# Patient Record
Sex: Female | Born: 1960
Health system: Southern US, Community
[De-identification: ages and names within clinical notes are randomized; demographics above are authoritative.]

## PROBLEM LIST (undated history)

## (undated) DIAGNOSIS — K219 Gastro-esophageal reflux disease without esophagitis: Secondary | ICD-10-CM

## (undated) DIAGNOSIS — M1990 Unspecified osteoarthritis, unspecified site: Secondary | ICD-10-CM

## (undated) DIAGNOSIS — M810 Age-related osteoporosis without current pathological fracture: Secondary | ICD-10-CM

## (undated) HISTORY — DX: Gastro-esophageal reflux disease without esophagitis: K21.9

## (undated) HISTORY — PX: AUGMENTATION MAMMAPLASTY: SUR837

## (undated) HISTORY — DX: Unspecified osteoarthritis, unspecified site: M19.90

---

## 1998-03-30 HISTORY — PX: BREAST ENHANCEMENT SURGERY: SHX7

## 2005-09-11 ENCOUNTER — Ambulatory Visit: Payer: Self-pay | Admitting: Unknown Physician Specialty

## 2008-04-09 LAB — HM COLONOSCOPY: HM Colonoscopy: NORMAL

## 2008-11-12 ENCOUNTER — Emergency Department: Payer: Self-pay | Admitting: Emergency Medicine

## 2009-04-24 ENCOUNTER — Ambulatory Visit: Payer: Self-pay | Admitting: Gastroenterology

## 2010-01-27 ENCOUNTER — Emergency Department: Payer: Self-pay | Admitting: Emergency Medicine

## 2010-10-23 ENCOUNTER — Emergency Department: Payer: Self-pay | Admitting: Emergency Medicine

## 2011-07-16 DIAGNOSIS — D7589 Other specified diseases of blood and blood-forming organs: Secondary | ICD-10-CM | POA: Insufficient documentation

## 2011-11-26 DIAGNOSIS — R609 Edema, unspecified: Secondary | ICD-10-CM | POA: Insufficient documentation

## 2012-02-01 ENCOUNTER — Ambulatory Visit: Payer: Self-pay | Admitting: Internal Medicine

## 2012-04-04 LAB — HM PAP SMEAR: HM Pap smear: NORMAL

## 2012-04-04 LAB — HM MAMMOGRAPHY: HM MAMMO: NORMAL

## 2014-11-09 ENCOUNTER — Ambulatory Visit (INDEPENDENT_AMBULATORY_CARE_PROVIDER_SITE_OTHER): Payer: BLUE CROSS/BLUE SHIELD | Admitting: Nurse Practitioner

## 2014-11-09 ENCOUNTER — Encounter: Payer: Self-pay | Admitting: Nurse Practitioner

## 2014-11-09 VITALS — BP 104/68 | HR 73 | Temp 97.9°F | Resp 14 | Ht 64.0 in | Wt 104.6 lb

## 2014-11-09 DIAGNOSIS — M1990 Unspecified osteoarthritis, unspecified site: Secondary | ICD-10-CM

## 2014-11-09 DIAGNOSIS — Z130 Encounter for screening for diseases of the blood and blood-forming organs and certain disorders involving the immune mechanism: Secondary | ICD-10-CM

## 2014-11-09 DIAGNOSIS — Z1329 Encounter for screening for other suspected endocrine disorder: Secondary | ICD-10-CM | POA: Diagnosis not present

## 2014-11-09 DIAGNOSIS — Z131 Encounter for screening for diabetes mellitus: Secondary | ICD-10-CM | POA: Diagnosis not present

## 2014-11-09 DIAGNOSIS — Z13220 Encounter for screening for lipoid disorders: Secondary | ICD-10-CM

## 2014-11-09 DIAGNOSIS — Z7689 Persons encountering health services in other specified circumstances: Secondary | ICD-10-CM

## 2014-11-09 DIAGNOSIS — K219 Gastro-esophageal reflux disease without esophagitis: Secondary | ICD-10-CM | POA: Diagnosis not present

## 2014-11-09 DIAGNOSIS — Z7189 Other specified counseling: Secondary | ICD-10-CM

## 2014-11-09 DIAGNOSIS — Z1239 Encounter for other screening for malignant neoplasm of breast: Secondary | ICD-10-CM

## 2014-11-09 LAB — LIPID PANEL
CHOLESTEROL: 212 mg/dL — AB (ref 125–200)
HDL: 88 mg/dL (ref >=46)
LDL Cholesterol: 111 mg/dL (ref <130)
TRIGLYCERIDES: 66 mg/dL (ref <150)
Total CHOL/HDL Ratio: 2.4 Ratio (ref <=5.0)
VLDL: 13 mg/dL (ref <30)

## 2014-11-09 LAB — CBC WITH DIFFERENTIAL/PLATELET
BASOS ABS: 0 10*3/uL (ref 0.0–0.1)
BASOS PCT: 1 % (ref 0–1)
EOS ABS: 0 10*3/uL (ref 0.0–0.7)
EOS PCT: 1 % (ref 0–5)
HCT: 38.9 % (ref 36.0–46.0)
HEMOGLOBIN: 13.2 g/dL (ref 12.0–15.0)
Lymphocytes Relative: 39 % (ref 12–46)
Lymphs Abs: 1.9 10*3/uL (ref 0.7–4.0)
MCH: 33.2 pg (ref 26.0–34.0)
MCHC: 33.9 g/dL (ref 30.0–36.0)
MCV: 97.7 fL (ref 78.0–100.0)
MONOS PCT: 8 % (ref 3–12)
MPV: 11.9 fL (ref 8.6–12.4)
Monocytes Absolute: 0.4 10*3/uL (ref 0.1–1.0)
NEUTROS ABS: 2.5 10*3/uL (ref 1.7–7.7)
Neutrophils Relative %: 51 % (ref 43–77)
PLATELETS: 125 10*3/uL — AB (ref 150–400)
RBC: 3.98 MIL/uL (ref 3.87–5.11)
RDW: 13.5 % (ref 11.5–15.5)
WBC: 4.9 10*3/uL (ref 4.0–10.5)

## 2014-11-09 LAB — COMPREHENSIVE METABOLIC PANEL
ALT: 17 U/L (ref 6–29)
AST: 21 U/L (ref 10–35)
Albumin: 4.2 g/dL (ref 3.6–5.1)
Alkaline Phosphatase: 66 U/L (ref 33–130)
BILIRUBIN TOTAL: 0.6 mg/dL (ref 0.2–1.2)
BUN: 12 mg/dL (ref 7–25)
CALCIUM: 9.4 mg/dL (ref 8.6–10.4)
CHLORIDE: 100 mmol/L (ref 98–110)
CO2: 26 mmol/L (ref 20–31)
CREATININE: 0.82 mg/dL (ref 0.50–1.05)
Glucose, Bld: 87 mg/dL (ref 65–99)
Potassium: 4.1 mmol/L (ref 3.5–5.3)
Sodium: 140 mmol/L (ref 135–146)
Total Protein: 6.5 g/dL (ref 6.1–8.1)

## 2014-11-09 LAB — TSH: TSH: 1.568 u[IU]/mL (ref 0.350–4.500)

## 2014-11-09 NOTE — Assessment & Plan Note (Signed)
Stable. Pt takes Ibuprofen OTC prn for hands aching. Will follow.

## 2014-11-09 NOTE — Assessment & Plan Note (Signed)
Worked up for LUQ pain 3 years ago with EGD and [place] and pt reports it was just GERD. Pt is having same symptoms and wanted to be checked and see if it is still GERD. Assured pt after exam that it most likely is. Zantac OTC 150 mg daily to twice daily before largest meal(s). Will follow.

## 2014-11-09 NOTE — Patient Instructions (Signed)

## 2014-11-09 NOTE — Assessment & Plan Note (Addendum)
Discussed acute and chronic issues. Reviewed health maintenance measures, PFSHx, and immunizations. Obtain routine labs TSH, Lipid panel, CBC w/ diff, A1c, and CMET. Pt was fasting at today's visit.   Needs updated PAP. Requested she schedule this and a physical if needed.   Mammogram referral placed

## 2014-11-09 NOTE — Progress Notes (Signed)
Pre visit review using our clinic review tool, if applicable. No additional management support is needed unless otherwise documented below in the visit note. 

## 2014-11-09 NOTE — Progress Notes (Signed)
Patient ID: Leslie Davidson, female    DOB: 10/01/1960  Age: 54 y.o. MRN: [id number]  CC: Establish Care   HPI Leslie Davidson presents for establishing care and CC of GERD.   1) New pt info:   Immunizations- tdap 2014  Mammogram- 2014, needs updated with implants   Pap- 2014 normal   Colonoscopy- 2010, every 10 years pt reports   Eye Exam- 7/16, no changes   Dental Exam- UTD  2) Chronic Problems-  Arthritis- Hands, flares occasionally, ibuprofen helpful GERD- Not taken anything recently, took gas-x    Tried Zantac in past with help 3) Acute Problems- Left side pain- Bothering her worse with driving x 4 weeks, denies taking anything recently for this, states this is the same feeling when she was worked up 3 years ago for left side pain--> GERD was end result.   History Leslie Davidson has a past medical history of Arthritis and GERD (gastroesophageal reflux disease).   She has past surgical history that includes Breast enhancement surgery (2000).   Her family history includes Arthritis in her maternal grandmother; Cancer in her father; Diabetes in her mother.She reports that she quit smoking about 31 years ago. Her smoking use included Cigarettes. She has never used smokeless tobacco. She reports that she drinks alcohol. She reports that she does not use illicit drugs.  No outpatient prescriptions prior to visit.   No facility-administered medications prior to visit.    ROS Review of Systems  Constitutional: Negative for fever, chills, diaphoresis and fatigue.  Respiratory: Negative for chest tightness, shortness of breath and wheezing.   Cardiovascular: Negative for chest pain, palpitations and leg swelling.  Gastrointestinal: Positive for abdominal pain. Negative for nausea, vomiting, diarrhea and abdominal distention.  Skin: Negative for rash.  Neurological: Negative for dizziness, weakness, numbness and headaches.  Psychiatric/Behavioral: The patient is not nervous/anxious.      Objective:  BP 104/68 mmHg  Pulse 73  Temp(Src) 97.9 F (36.6 C)  Resp 14  Ht 5\' 4"  (1.626 m)  Wt 104 lb 9.6 oz (47.446 kg)  BMI 17.95 kg/m2  SpO2 98%  LMP 01/02/2013  Physical Exam  Constitutional: She is oriented to person, place, and time. She appears well-developed and well-nourished. No distress.  HENT:  Head: Normocephalic and atraumatic.  Right Ear: External ear normal.  Left Ear: External ear normal.  Cardiovascular: Normal rate, regular rhythm and normal heart sounds.  Exam reveals no gallop and no friction rub.   No murmur heard. Pulmonary/Chest: Effort normal and breath sounds normal. No respiratory distress. She has no wheezes. She has no rales. She exhibits no tenderness.  Abdominal: Soft. Bowel sounds are normal. She exhibits no distension and no mass. There is no tenderness. There is no rebound, no guarding and no CVA tenderness.  Neurological: She is alert and oriented to person, place, and time. No cranial nerve deficit. She exhibits normal muscle tone. Coordination normal.  Skin: Skin is warm and dry. No rash noted. She is not diaphoretic.  Psychiatric: She has a normal mood and affect. Her behavior is normal. Judgment and thought content normal.    Assessment & Plan:   Vance was seen today for establish care.  Diagnoses and all orders for this visit:  Gastroesophageal reflux disease without esophagitis  Screening for thyroid disorder -     TSH  Screening for deficiency anemia -     CBC with Differential/Platelet  Screening for diabetes mellitus -     Comprehensive metabolic panel -  Hemoglobin A1c  Screening for hyperlipidemia -     Lipid panel  Encounter to establish care  Screening for breast cancer -     MM SCREENING BREAST W/IMPLANT TOMO BILATERAL; Future  Arthritis  Ms. Kuipers does not currently have medications on file.  No orders of the defined types were placed in this encounter.     Follow-up: Return in about 6 months  (around 05/12/2015) for GERD follow up.

## 2014-11-10 LAB — HEMOGLOBIN A1C
Hgb A1c MFr Bld: 5.8 % — ABNORMAL HIGH (ref <5.7)
MEAN PLASMA GLUCOSE: 120 mg/dL — AB (ref <117)

## 2014-11-20 ENCOUNTER — Other Ambulatory Visit: Payer: Self-pay | Admitting: Nurse Practitioner

## 2014-11-20 ENCOUNTER — Ambulatory Visit
Admission: RE | Admit: 2014-11-20 | Discharge: 2014-11-20 | Disposition: A | Discharge status: Home / Self Care | Payer: BLUE CROSS/BLUE SHIELD | Source: Ambulatory Visit | Attending: Nurse Practitioner | Admitting: Nurse Practitioner

## 2014-11-20 DIAGNOSIS — Z9882 Breast implant status: Secondary | ICD-10-CM | POA: Insufficient documentation

## 2014-11-20 DIAGNOSIS — Z1231 Encounter for screening mammogram for malignant neoplasm of breast: Secondary | ICD-10-CM | POA: Insufficient documentation

## 2014-11-20 DIAGNOSIS — Z1239 Encounter for other screening for malignant neoplasm of breast: Secondary | ICD-10-CM

## 2014-11-22 ENCOUNTER — Other Ambulatory Visit: Payer: Self-pay | Admitting: Nurse Practitioner

## 2014-11-22 DIAGNOSIS — Z1239 Encounter for other screening for malignant neoplasm of breast: Secondary | ICD-10-CM

## 2014-11-28 ENCOUNTER — Ambulatory Visit
Admission: RE | Admit: 2014-11-28 | Discharge: 2014-11-28 | Disposition: A | Discharge status: Home / Self Care | Payer: BLUE CROSS/BLUE SHIELD | Source: Ambulatory Visit | Attending: Nurse Practitioner | Admitting: Nurse Practitioner

## 2014-11-28 ENCOUNTER — Other Ambulatory Visit: Payer: Self-pay | Admitting: Nurse Practitioner

## 2014-11-28 DIAGNOSIS — R928 Other abnormal and inconclusive findings on diagnostic imaging of breast: Secondary | ICD-10-CM | POA: Diagnosis not present

## 2014-11-28 DIAGNOSIS — Z1239 Encounter for other screening for malignant neoplasm of breast: Secondary | ICD-10-CM

## 2015-02-09 ENCOUNTER — Emergency Department
Admission: EM | Admit: 2015-02-09 | Discharge: 2015-02-09 | Disposition: A | Discharge status: Home / Self Care | Payer: BLUE CROSS/BLUE SHIELD | Attending: Emergency Medicine | Admitting: Emergency Medicine

## 2015-02-09 ENCOUNTER — Emergency Department: Payer: BLUE CROSS/BLUE SHIELD

## 2015-02-09 DIAGNOSIS — R079 Chest pain, unspecified: Secondary | ICD-10-CM

## 2015-02-09 DIAGNOSIS — Z87891 Personal history of nicotine dependence: Secondary | ICD-10-CM | POA: Diagnosis not present

## 2015-02-09 DIAGNOSIS — R6884 Jaw pain: Secondary | ICD-10-CM | POA: Diagnosis present

## 2015-02-09 LAB — CBC WITH DIFFERENTIAL/PLATELET
Basophils Absolute: 0.1 10*3/uL (ref 0–0.1)
Basophils Relative: 1 %
Eosinophils Absolute: 0.1 10*3/uL (ref 0–0.7)
Eosinophils Relative: 2 %
HEMATOCRIT: 38.1 % (ref 35.0–47.0)
HEMOGLOBIN: 12.8 g/dL (ref 12.0–16.0)
LYMPHS PCT: 37 %
Lymphs Abs: 1.5 10*3/uL (ref 1.0–3.6)
MCH: 33 pg (ref 26.0–34.0)
MCHC: 33.5 g/dL (ref 32.0–36.0)
MCV: 98.5 fL (ref 80.0–100.0)
MONOS PCT: 9 %
Monocytes Absolute: 0.3 10*3/uL (ref 0.2–0.9)
NEUTROS ABS: 2.1 10*3/uL (ref 1.4–6.5)
NEUTROS PCT: 51 %
Platelets: 182 10*3/uL (ref 150–440)
RBC: 3.86 MIL/uL (ref 3.80–5.20)
RDW: 12.8 % (ref 11.5–14.5)
WBC: 4 10*3/uL (ref 3.6–11.0)

## 2015-02-09 LAB — BASIC METABOLIC PANEL
Anion gap: 6 (ref 5–15)
BUN: 18 mg/dL (ref 6–20)
CHLORIDE: 107 mmol/L (ref 101–111)
CO2: 26 mmol/L (ref 22–32)
CREATININE: 0.61 mg/dL (ref 0.44–1.00)
Calcium: 9.1 mg/dL (ref 8.9–10.3)
GFR calc non Af Amer: >60 mL/min (ref >60)
Glucose, Bld: 105 mg/dL — ABNORMAL HIGH (ref 65–99)
POTASSIUM: 3.8 mmol/L (ref 3.5–5.1)
Sodium: 139 mmol/L (ref 135–145)

## 2015-02-09 LAB — TROPONIN I

## 2015-02-09 MED ORDER — MORPHINE SULFATE (PF) 4 MG/ML IV SOLN
4.0000 mg | Freq: Once | INTRAVENOUS | Status: DC
  Filled 2015-02-09: qty 1

## 2015-02-09 MED ORDER — ASPIRIN 81 MG PO CHEW
324.0000 mg | CHEWABLE_TABLET | Freq: Once | ORAL | Status: AC
Start: 2015-02-09 — End: 2015-02-09
  Administered 2015-02-09: 324 mg via ORAL
  Filled 2015-02-09: qty 4

## 2015-02-09 MED ORDER — NITROGLYCERIN 0.4 MG SL SUBL
0.4000 mg | SUBLINGUAL_TABLET | SUBLINGUAL | Status: DC | PRN
  Administered 2015-02-09: 0.4 mg via SUBLINGUAL
  Filled 2015-02-09: qty 1

## 2015-02-09 MED ORDER — ONDANSETRON HCL 4 MG/2ML IJ SOLN
4.0000 mg | Freq: Once | INTRAMUSCULAR | Status: DC
  Filled 2015-02-09: qty 2

## 2015-02-09 NOTE — ED Notes (Addendum)
Pt presents to ED with c/o left jaw pain that woke her up from sleep.  She states the pain radiates to left arm.  She report reports that she had some nausea during the day yesterday.  Pt also report that she had right jaw pain several weeks ago and she was evaluated and treated by her dentist for TMJ.

## 2015-02-09 NOTE — ED Provider Notes (Addendum)
[facility] Emergency Department Provider Note  ____________________________________________  Time seen: Approximately 530 AM  I have reviewed the triage vital signs and the nursing notes.   HISTORY  Chief Complaint Jaw Pain    HPI Leslie Davidson is a 54 y.o. female with a history of arthritis and GERD who is presenting tonight with several weeks of left-sided jaw pain. She says that the jaw pain has been constant but today woke her up from sleep with pain radiating into her left arm. She says that the pain was especially bad at her wrist. She says that she is also had chest pain off and on for several weeks with the jaw pain. She denies any exertional worsening of the pain. Denies any radiation of the back. Says that she is experiencing some short of breath when she lies down at night with the pain. No nausea or vomiting or diaphoresis. She denies any history of cardiac disease in her family. The patient is not a smoker and denies any illicit drug use. She describes the jaw pain is to the angle of her left mandible that does not worsen with chewing. She says she does do some heavy lifting of her grandson but she does not have any worsening of her symptoms when she lets the grandson.   Past Medical History  Diagnosis Date  . Arthritis   . GERD (gastroesophageal reflux disease)     Patient Active Problem List   Diagnosis Date Noted  . GERD (gastroesophageal reflux disease) 11/09/2014  . Encounter to establish care 11/09/2014  . Screening for thyroid disorder 11/09/2014  . Screening for deficiency anemia 11/09/2014  . Screening for diabetes mellitus 11/09/2014  . Screening for hyperlipidemia 11/09/2014  . Screening for breast cancer 11/09/2014  . Arthritis 11/09/2014    Past Surgical History  Procedure Laterality Date  . Breast enhancement surgery  2000  . Augmentation mammaplasty Bilateral     1998    No current outpatient prescriptions on  file.  Allergies Review of patient's allergies indicates no known allergies.  Family History  Problem Relation Age of Onset  . Diabetes Mother   . Cancer Father     Lung cancer  . Arthritis Maternal Grandmother     Social History Social History  Substance Use Topics  . Smoking status: Former Smoker    Types: Cigarettes    Quit date: 03/31/1983  . Smokeless tobacco: Never Used  . Alcohol Use: 0.0 oz/week    0 Standard drinks or equivalent per week     Comment: Occasional     Review of Systems Constitutional: No fever/chills Eyes: No visual changes. ENT: No sore throat. Cardiovascular: As above  Respiratory: As above  Gastrointestinal: No abdominal pain.  No nausea, no vomiting.  No diarrhea.  No constipation. Genitourinary: Negative for dysuria. Musculoskeletal: Negative for back pain. Skin: Negative for rash. Neurological: Negative for headaches, focal weakness or numbness.  10-point ROS otherwise negative.  ____________________________________________   PHYSICAL EXAM:  VITAL SIGNS: ED Triage Vitals  Enc Vitals Group     BP 02/09/15 0445 110/67 mmHg     Pulse Rate 02/09/15 0445 62     Resp 02/09/15 0445 22     Temp 02/09/15 0445 100 F (37.8 C)     Temp Source 02/09/15 0445 Oral     SpO2 02/09/15 0445 100 %     Weight 02/09/15 0445 103 lb (46.72 kg)     Height 02/09/15 0445 5\' 4"  (1.626 m)  Head Cir --      Peak Flow --      Pain Score 02/09/15 0446 4     Pain Loc --      Pain Edu? --      Excl. in [place]? --     Constitutional: Alert and oriented. Well appearing and in no acute distress. Eyes: Conjunctivae are normal. PERRL. EOMI. Head: Atraumatic. Nose: No congestion/rhinnorhea. Mouth/Throat: Mucous membranes are moist.  Oropharynx non-erythematous. Neck: No stridor.  No tenderness palpation to the trapezius muscles. Cardiovascular: Normal rate, regular rhythm. Grossly normal heart sounds.  Good peripheral circulation with bilateral and equal  radial pulses. Chest pain is not reproducible. Respiratory: Normal respiratory effort.  No retractions. Lungs CTAB. Gastrointestinal: Soft and nontender. No distention. No abdominal bruits. No CVA tenderness. Musculoskeletal: No lower extremity tenderness nor edema.  No joint effusions. Neurologic:  Normal speech and language. No gross focal neurologic deficits are appreciated. No gait instability. Intact sensation to light touch throughout the bilateral upper extremities. Skin:  Skin is warm, dry and intact. No rash noted. Psychiatric: Mood and affect are normal. Speech and behavior are normal.  ____________________________________________   LABS (all labs ordered are listed, but only abnormal results are displayed)  Labs Reviewed  BASIC METABOLIC PANEL - Abnormal; Notable for the following:    Glucose, Bld 105 (*)    All other components within normal limits  CBC WITH DIFFERENTIAL/PLATELET  TROPONIN I  TROPONIN I   ____________________________________________  EKG  ED ECG REPORT I, Doran Stabler, the attending physician, personally viewed and interpreted this ECG.   Date: 02/09/2015  EKG Time: 446  Rate: 63  Rhythm: normal sinus rhythm  Axis: Normal axis  Intervals:none  ST&T Change: No ST elevations or depressions. T-wave inversions in aVL as well as V2. No new inversions when compared with EKG from 01/27/2010.   ____________________________________________  RADIOLOGY  No evidence of acute cardiopulmonary disease on the chest x-ray. ____________________________________________   PROCEDURES   ____________________________________________   INITIAL IMPRESSION / ASSESSMENT AND PLAN / ED COURSE  Pertinent labs & imaging results that were available during my care of the patient were reviewed by me and considered in my medical decision making (see chart for details).  ----------------------------------------- 7:28 AM on  02/09/2015 -----------------------------------------  Patient is still reporting pain after nitroglycerin with no improvement at this time. Initial lab work is reassuring and does not show any elevated troponin after several weeks of jaw symptoms. The pain is nonexertional and atypical in many ways for cardiac chest pain. I discussed the case with Dr.Hochrein of the cardiology service on call who agrees to help set the patient up with outpatient follow-up. I explained this to the patient as well as her husband who are aware of the plan and willing to comply. Possibly jaw and left arm pain secondary to radiculopathy. The patient does say that she has been lifting her grandson frequently. However, given age and presentation I think it is prudent that the patient follow-up with cardiology. Still pending second troponin at this time. Signed out to Dr. Thomasene Lot. ____________________________________________   FINAL CLINICAL IMPRESSION(S) / ED DIAGNOSES  Acute chest pain.    Orbie Pyo, MD 02/09/15 0730  Less likely PE or dissection. Pain not radiating through to the back. Pulses are equal to the bilateral radial as well as dorsalis pedis. No shortness of breath at this time. No tachycardia or hypoxia.    Orbie Pyo, MD 02/09/15 [phone]

## 2015-02-09 NOTE — ED Provider Notes (Signed)
-----------------------------------------   8:12 AM on 02/09/2015 -----------------------------------------  Accepted signout from Dr. Dineen Kid. 73 her old female with jaw pain. Dr. Dineen Kid had low suspicion for cardiac, but felt it was appropriate to obtain a second troponin level and, if negative, discharge the patient with cardiology follow-up for further evaluation. Please see his history and physical for additional details on the patient's presentation and his clinical reasoning.  At this time, the troponin level has returned undetectable a second time.  Reevaluation of the patient finds her overall comfortable. We reviewed her history and how the discomfort started at approximately 3 AM. She is feeling better at this time. She looks well. She understands the plan to follow up with Dr. Rockey Situ at [facility].   Ahmed Prima, MD 02/09/15 [phone]

## 2015-02-09 NOTE — ED Notes (Signed)
NAD noted at this time. Pt ambulatory to the lobby, refused wheelchair. Pt denies comments/concerns at this time.

## 2015-02-12 ENCOUNTER — Ambulatory Visit: Payer: BLUE CROSS/BLUE SHIELD | Admitting: Nurse Practitioner

## 2015-02-20 ENCOUNTER — Ambulatory Visit: Payer: BLUE CROSS/BLUE SHIELD | Admitting: Cardiovascular Disease

## 2015-02-25 ENCOUNTER — Telehealth: Payer: Self-pay | Admitting: Internal Medicine

## 2015-02-25 NOTE — Telephone Encounter (Signed)
Pt called states that she needs a 3 month u/s. Looking through her chart she needs a 3 month left breast u/s. Please enter order.

## 2015-02-26 NOTE — Telephone Encounter (Signed)
Please advise 

## 2015-02-27 NOTE — Telephone Encounter (Signed)
Looks like this is your pt.  Let me know if you need to do something for you in getting this scheduled.

## 2015-03-01 ENCOUNTER — Other Ambulatory Visit: Payer: Self-pay | Admitting: Nurse Practitioner

## 2015-03-01 DIAGNOSIS — N6320 Unspecified lump in the left breast, unspecified quadrant: Secondary | ICD-10-CM

## 2015-03-01 NOTE — Telephone Encounter (Signed)
It looks like you have agreed to seeing this patient now. She has not been seen by you yet, therefore can not placed order under your name. Please advise & send to [facility].

## 2015-03-01 NOTE — Telephone Encounter (Signed)
Please let pt know that it has been ordered.   Thanks!

## 2015-03-01 NOTE — Telephone Encounter (Signed)
Pt called back to check on the status of her mammogram.. Please advise pt

## 2015-03-07 ENCOUNTER — Ambulatory Visit: Payer: BLUE CROSS/BLUE SHIELD

## 2015-03-14 ENCOUNTER — Telehealth: Payer: Self-pay | Admitting: *Deleted

## 2015-03-14 ENCOUNTER — Ambulatory Visit
Admission: RE | Admit: 2015-03-14 | Discharge: 2015-03-14 | Disposition: A | Discharge status: Home / Self Care | Payer: BLUE CROSS/BLUE SHIELD | Source: Ambulatory Visit | Attending: Nurse Practitioner | Admitting: Nurse Practitioner

## 2015-03-14 ENCOUNTER — Other Ambulatory Visit: Payer: Self-pay | Admitting: Nurse Practitioner

## 2015-03-14 DIAGNOSIS — N63 Unspecified lump in breast: Secondary | ICD-10-CM | POA: Diagnosis not present

## 2015-03-14 DIAGNOSIS — N6320 Unspecified lump in the left breast, unspecified quadrant: Secondary | ICD-10-CM

## 2015-03-14 NOTE — Telephone Encounter (Signed)
Please advise 

## 2015-03-14 NOTE — Telephone Encounter (Signed)
[facility] would like a order for a diagnostic mammogram -uni lateral for the left breast.

## 2015-03-15 NOTE — Telephone Encounter (Signed)
They called and I gave a verbal.

## 2015-03-17 ENCOUNTER — Other Ambulatory Visit: Payer: Self-pay | Admitting: Internal Medicine

## 2015-03-17 DIAGNOSIS — R928 Other abnormal and inconclusive findings on diagnostic imaging of breast: Secondary | ICD-10-CM

## 2015-03-17 NOTE — Progress Notes (Signed)
Order placed for f/u mammogram and ultrasound.  

## 2015-03-27 ENCOUNTER — Encounter: Payer: Self-pay | Admitting: Internal Medicine

## 2015-04-22 ENCOUNTER — Encounter (INDEPENDENT_AMBULATORY_CARE_PROVIDER_SITE_OTHER): Payer: Self-pay

## 2015-04-22 ENCOUNTER — Ambulatory Visit (INDEPENDENT_AMBULATORY_CARE_PROVIDER_SITE_OTHER): Payer: BLUE CROSS/BLUE SHIELD | Admitting: Cardiovascular Disease

## 2015-04-22 ENCOUNTER — Encounter: Payer: Self-pay | Admitting: Cardiovascular Disease

## 2015-04-22 VITALS — BP 100/60 | HR 68 | Ht 63.0 in | Wt 101.8 lb

## 2015-04-22 DIAGNOSIS — M79602 Pain in left arm: Secondary | ICD-10-CM | POA: Diagnosis not present

## 2015-04-22 DIAGNOSIS — Z0000 Encounter for general adult medical examination without abnormal findings: Secondary | ICD-10-CM | POA: Diagnosis not present

## 2015-04-22 DIAGNOSIS — R6884 Jaw pain: Secondary | ICD-10-CM

## 2015-04-22 DIAGNOSIS — E785 Hyperlipidemia, unspecified: Secondary | ICD-10-CM

## 2015-04-22 DIAGNOSIS — R7309 Other abnormal glucose: Secondary | ICD-10-CM | POA: Insufficient documentation

## 2015-04-22 DIAGNOSIS — R0602 Shortness of breath: Secondary | ICD-10-CM | POA: Diagnosis not present

## 2015-04-22 DIAGNOSIS — M79603 Pain in arm, unspecified: Secondary | ICD-10-CM | POA: Insufficient documentation

## 2015-04-22 NOTE — Assessment & Plan Note (Signed)
Atypical symptoms jaw pain, arm pain Rule out in the ER reviewed with her She has been exercising 4 miles per day since that time with no symptoms Borderline risk factors with borderline elevated glucose, average cholesterol level, no significant smoking history. As she is asymptomatic, no stress test ordered at this time

## 2015-04-22 NOTE — Assessment & Plan Note (Signed)
Cholesterol discussed with her in detail. Would not necessarily start a cholesterol medication unless a CT coronary calcium score documented calcified atherosclerotic coronary plaque

## 2015-04-22 NOTE — Patient Instructions (Signed)
You are doing well. No medication changes were made.  Please research CT coronary calcium score on google  Test is $150 total  Please call us if you have new issues that need to be addressed before your next appt.

## 2015-04-22 NOTE — Assessment & Plan Note (Signed)
She is already eating very healthy, very thin, exercises daily. Hemoglobin A1c borderline elevated, likely genetic issue. Encouraged her to continue her very healthy habits

## 2015-04-22 NOTE — Assessment & Plan Note (Signed)
Risk factors discussed with her with borderline glucose, cholesterol, No clear indication for stress testing as she is asymptomatic. Could consider CT coronary calcium scoring in [place]. This test was discussed with her in detail. She will research this and call us if she would like to have this ordered.

## 2015-04-22 NOTE — Progress Notes (Signed)
Patient ID: Leslie Davidson, female    DOB: 06-14-1960, 55 y.o.   MRN: [id number]  HPI Comments: [name] is a very pleasant 55 year old woman, patient of Dr. Nicki Reaper, recently seen in the emergency room for her drop pain, left chest/left shoulder, arm pain who presents by referral from the emergency room doctor, Dr. Francene Castle, for evaluation of arm pain, jaw pain.   She reports that in November 2016 she was woken from sleep with left jaw, left arm pain. She had been having difficulty with right jaw, TMJ. Emergency room notes indicate left-sided chest pain on and off, left side jaw pain on and off Workup in the emergency room reviewed with her including lab work, troponin 2, CBC, BMP, chest x-ray. These were reviewed independently by myself  Since she was seen in the emergency room, she denies having any further symptoms She is active, treadmills 4 miles per day with no symptoms of jaw pain, arm pain, chest pain  Lab work reviewed with her in detail showing total cholesterol 212, hemoglobin A1c 5.8 Mother has diabetes, high cholesterol Father died of lung cancer  Patient does have a smoking history, quit before age 5 She is very careful with her diet, eats very healthy on a regular basis  EKG on today's visit shows normal sinus rhythm with rate 68 bpm, no significant ST or T-wave changes  Currently not on any medications apart from Zantac as needed      No Known Allergies  No current outpatient prescriptions on file prior to visit.   No current facility-administered medications on file prior to visit.    Past Medical History  Diagnosis Date  . Arthritis   . GERD (gastroesophageal reflux disease)     Past Surgical History  Procedure Laterality Date  . Breast enhancement surgery  2000  . Augmentation mammaplasty Bilateral     1998    Social History  reports that she quit smoking about 32 years ago. Her smoking use included Cigarettes. She has never used smokeless  tobacco. She reports that she drinks alcohol. She reports that she does not use illicit drugs.  Family History family history includes Arthritis in her maternal grandmother; Cancer in her father; Diabetes in her mother.    Review of Systems  Constitutional: Negative.   Respiratory: Negative.   Cardiovascular: Positive for chest pain.       Left arm pain, jaw pain in November  Gastrointestinal: Negative.   Musculoskeletal: Negative.   Neurological: Negative.   Hematological: Negative.   Psychiatric/Behavioral: Negative.   All other systems reviewed and are negative.   BP 100/60 mmHg  Pulse 68  Ht 5\' 3"  (1.6 m)  Wt 101 lb 12 oz (46.153 kg)  BMI 18.03 kg/m2  LMP 01/02/2013  Physical Exam  Constitutional: She is oriented to person, place, and time. She appears well-developed and well-nourished.  HENT:  Head: Normocephalic.  Nose: Nose normal.  Mouth/Throat: Oropharynx is clear and moist.  Eyes: Conjunctivae are normal. Pupils are equal, round, and reactive to light.  Neck: Normal range of motion. Neck supple. No JVD present.  Cardiovascular: Normal rate, regular rhythm, normal heart sounds and intact distal pulses.  Exam reveals no gallop and no friction rub.   No murmur heard. Pulmonary/Chest: Effort normal and breath sounds normal. No respiratory distress. She has no wheezes. She has no rales. She exhibits no tenderness.  Abdominal: Soft. Bowel sounds are normal. She exhibits no distension. There is no tenderness.  Musculoskeletal: Normal  range of motion. She exhibits no edema or tenderness.  Lymphadenopathy:    She has no cervical adenopathy.  Neurological: She is alert and oriented to person, place, and time. Coordination normal.  Skin: Skin is warm and dry. No rash noted. No erythema.  Psychiatric: She has a normal mood and affect. Her behavior is normal. Judgment and thought content normal.

## 2015-05-13 ENCOUNTER — Ambulatory Visit: Payer: BLUE CROSS/BLUE SHIELD | Admitting: Nurse Practitioner

## 2015-05-13 ENCOUNTER — Encounter: Payer: Self-pay | Admitting: Internal Medicine

## 2015-05-13 ENCOUNTER — Ambulatory Visit (INDEPENDENT_AMBULATORY_CARE_PROVIDER_SITE_OTHER): Payer: BLUE CROSS/BLUE SHIELD | Admitting: Internal Medicine

## 2015-05-13 VITALS — BP 100/70 | HR 64 | Temp 98.5°F | Resp 18 | Ht 63.0 in | Wt 103.5 lb

## 2015-05-13 DIAGNOSIS — R928 Other abnormal and inconclusive findings on diagnostic imaging of breast: Secondary | ICD-10-CM | POA: Diagnosis not present

## 2015-05-13 DIAGNOSIS — R7309 Other abnormal glucose: Secondary | ICD-10-CM | POA: Diagnosis not present

## 2015-05-13 DIAGNOSIS — K219 Gastro-esophageal reflux disease without esophagitis: Secondary | ICD-10-CM | POA: Diagnosis not present

## 2015-05-13 NOTE — Progress Notes (Signed)
Patient ID: AVRIEL WUJCIK, female   DOB: [birth year], 55 y.o.   MRN: [id number]   Subjective:    Patient ID: Leslie Davidson, female    DOB: 1961-01-13, 55 y.o.   MRN: [id number]  HPI  Patient here to establish care.  She tries to stay active.  Exercises.  No cardiac symptoms with increased activity or exertion.  Has a documented history of GERD.  Takes zantac prn.  Does not appear to have a significant problem with acid reflux.  No abdominal pain or cramping.  Some constipation.  Takes stool softener.  Had colonoscopy 03/2008.  States recommended f/u in 10 years.  Quit smoking in 1985.     Past Medical History  Diagnosis Date  . Arthritis   . GERD (gastroesophageal reflux disease)    Past Surgical History  Procedure Laterality Date  . Breast enhancement surgery  2000  . Augmentation mammaplasty Bilateral     1998   Family History  Problem Relation Age of Onset  . Diabetes Mother   . Cancer Father     Lung cancer  . Arthritis Maternal Grandmother    Social History   Social History  . Marital Status: Married    Spouse Name: N/A  . Number of Children: N/A  . Years of Education: N/A   Social History Main Topics  . Smoking status: Former Smoker    Types: Cigarettes    Quit date: 03/31/1983  . Smokeless tobacco: Never Used  . Alcohol Use: 0.0 oz/week    0 Standard drinks or equivalent per week     Comment: Occasional   . Drug Use: No  . Sexual Activity:    Partners: Male    Birth Control/ Protection: Post-menopausal     Comment: Husband   Other Topics Concern  . None   Social History Narrative   Retired from the school system    Lives with husband    Children- 3- son and 2 daughters    Pets: 1 dog lives inside    Caffeine- Coffee 3-4 cups, 1 cup diet pepsi       Outpatient Encounter Prescriptions as of 05/13/2015  Medication Sig  . ranitidine (ZANTAC) 150 MG tablet Take 150 mg by mouth as needed.    No facility-administered encounter medications on file as of  05/13/2015.    Review of Systems  Constitutional: Negative for appetite change and unexpected weight change.  HENT: Negative for congestion and sinus pressure.   Respiratory: Negative for cough, chest tightness and shortness of breath.   Cardiovascular: Negative for chest pain, palpitations and leg swelling.  Gastrointestinal: Positive for constipation. Negative for nausea, vomiting, abdominal pain and diarrhea.  Genitourinary: Negative for dysuria and difficulty urinating.  Musculoskeletal: Negative for back pain and joint swelling.  Skin: Negative for color change and rash.  Neurological: Negative for dizziness, light-headedness and headaches.  Psychiatric/Behavioral: Negative for dysphoric mood and agitation.       Objective:    Physical Exam  Constitutional: She appears well-developed and well-nourished. No distress.  HENT:  Nose: Nose normal.  Mouth/Throat: Oropharynx is clear and moist.  Eyes: Conjunctivae are normal. Right eye exhibits no discharge. Left eye exhibits no discharge.  Neck: Neck supple. No thyromegaly present.  Cardiovascular: Normal rate and regular rhythm.   Pulmonary/Chest: Breath sounds normal. No respiratory distress. She has no wheezes.  Abdominal: Soft. Bowel sounds are normal. There is no tenderness.  Musculoskeletal: She exhibits no edema or tenderness.  Lymphadenopathy:  She has no cervical adenopathy.  Skin: No rash noted. No erythema.  Psychiatric: She has a normal mood and affect. Her behavior is normal.    BP 100/70 mmHg  Pulse 64  Temp(Src) 98.5 F (36.9 C) (Oral)  Resp 18  Ht 5\' 3"  (1.6 m)  Wt 103 lb 8 oz (46.947 kg)  BMI 18.34 kg/m2  SpO2 97%  LMP 01/02/2013 Wt Readings from Last 3 Encounters:  05/13/15 103 lb 8 oz (46.947 kg)  04/22/15 101 lb 12 oz (46.153 kg)  02/09/15 103 lb (46.72 kg)     Lab Results  Component Value Date   WBC 4.0 02/09/2015   HGB 12.8 02/09/2015   HCT 38.1 02/09/2015   PLT 182 02/09/2015   GLUCOSE  105* 02/09/2015   CHOL 212* 11/09/2014   TRIG 66 11/09/2014   HDL 88 11/09/2014   LDLCALC 111 11/09/2014   ALT 17 11/09/2014   AST 21 11/09/2014   NA 139 02/09/2015   K 3.8 02/09/2015   CL 107 02/09/2015   CREATININE 0.61 02/09/2015   BUN 18 02/09/2015   CO2 26 02/09/2015   TSH 1.568 11/09/2014   HGBA1C 5.8* 11/09/2014    US Breast Ltd Uni Left Inc Axilla  03/14/2015  CLINICAL DATA:  Short time follow-up for probably benign left breast mass. EXAM: DIGITAL DIAGNOSTIC LEFT MAMMOGRAM WITH 3D TOMOSYNTHESIS WITH CAD ULTRASOUND LEFT BREAST COMPARISON:  Previous exam(s). ACR Breast Density Category b: There are scattered areas of fibroglandular density. FINDINGS: Note that initial ultrasound was performed, however both standard and implant displaced mammograms were subsequently performed of the left breast. A subpectoral saline left breast implant is present. No suspicious masses or calcifications are seen in the left breast. The low density circumscribed 7 mm probably benign mass in the slightly inner upper left breast appears unchanged when compared to the prior exam. A similar appearing mass is seen on the MLO view of the left breast on mammograms from 2013. Mammographic images were processed with CAD. Physical examination of the upper inner left breast does not reveal any palpable masses. Targeted ultrasound of the left breast was performed demonstrating an oval hypoechoic mass with an internal echogenic component at 11 o'clock 5 cm from the nipple measuring 0.3 x 0.1 x 0.3 cm, overall similar in appearance when compared to the prior exam in felt to represent an intramammary lymph node. IMPRESSION: Unchanged probably benign left breast mass. There is no mammographic evidence of malignancy in the left breast. RECOMMENDATION: Bilateral diagnostic mammography with possible left breast ultrasound August 2017 to demonstrate 1 year stability of the probably benign left breast mass. I have discussed the  findings and recommendations with the patient. Results were also provided in writing at the conclusion of the visit. If applicable, a reminder letter will be sent to the patient regarding the next appointment. BI-RADS CATEGORY  3: Probably benign. Electronically Signed   By: Everlean Alstrom M.D.   On: 03/14/2015 15:58   Mm Diag Breast Tomo Uni Left  03/14/2015  CLINICAL DATA:  Short time follow-up for probably benign left breast mass. EXAM: DIGITAL DIAGNOSTIC LEFT MAMMOGRAM WITH 3D TOMOSYNTHESIS WITH CAD ULTRASOUND LEFT BREAST COMPARISON:  Previous exam(s). ACR Breast Density Category b: There are scattered areas of fibroglandular density. FINDINGS: Note that initial ultrasound was performed, however both standard and implant displaced mammograms were subsequently performed of the left breast. A subpectoral saline left breast implant is present. No suspicious masses or calcifications are seen in the left breast.  The low density circumscribed 7 mm probably benign mass in the slightly inner upper left breast appears unchanged when compared to the prior exam. A similar appearing mass is seen on the MLO view of the left breast on mammograms from 2013. Mammographic images were processed with CAD. Physical examination of the upper inner left breast does not reveal any palpable masses. Targeted ultrasound of the left breast was performed demonstrating an oval hypoechoic mass with an internal echogenic component at 11 o'clock 5 cm from the nipple measuring 0.3 x 0.1 x 0.3 cm, overall similar in appearance when compared to the prior exam in felt to represent an intramammary lymph node. IMPRESSION: Unchanged probably benign left breast mass. There is no mammographic evidence of malignancy in the left breast. RECOMMENDATION: Bilateral diagnostic mammography with possible left breast ultrasound August 2017 to demonstrate 1 year stability of the probably benign left breast mass. I have discussed the findings and  recommendations with the patient. Results were also provided in writing at the conclusion of the visit. If applicable, a reminder letter will be sent to the patient regarding the next appointment. BI-RADS CATEGORY  3: Probably benign. Electronically Signed   By: Everlean Alstrom M.D.   On: 03/14/2015 15:58       Assessment & Plan:   Problem List Items Addressed This Visit    Abnormal mammogram    Previous documented abnormal mammogram.  See mammogram report.  Recommended f/u bilateral diagnostic mammogram with ultrasound in 10/2015.  Already scheduled.        Elevated glucose level    Schedule fasting glucose and a1c.       Relevant Orders   Lipid panel   Hemoglobin 123456   Basic metabolic panel   GERD (gastroesophageal reflux disease) - Primary    No significant problem with reflux currently.  Zantac as directed.  Has only been taking prn.  Follow.         I spent 25 minutes with the patient and more than 50% of the time was spent in consultation regarding the above.     Einar Pheasant, MD

## 2015-05-13 NOTE — Progress Notes (Signed)
Pre-visit discussion using our clinic review tool. No additional management support is needed unless otherwise documented below in the visit note.  

## 2015-05-27 ENCOUNTER — Encounter: Payer: Self-pay | Admitting: Internal Medicine

## 2015-05-27 DIAGNOSIS — R928 Other abnormal and inconclusive findings on diagnostic imaging of breast: Secondary | ICD-10-CM | POA: Insufficient documentation

## 2015-05-27 NOTE — Assessment & Plan Note (Signed)
Schedule fasting glucose and a1c.

## 2015-05-27 NOTE — Assessment & Plan Note (Signed)
Previous documented abnormal mammogram.  See mammogram report.  Recommended f/u bilateral diagnostic mammogram with ultrasound in 10/2015.  Already scheduled.

## 2015-05-27 NOTE — Assessment & Plan Note (Signed)
No significant problem with reflux currently.  Zantac as directed.  Has only been taking prn.  Follow.

## 2015-06-26 ENCOUNTER — Other Ambulatory Visit (INDEPENDENT_AMBULATORY_CARE_PROVIDER_SITE_OTHER): Payer: BLUE CROSS/BLUE SHIELD

## 2015-06-26 DIAGNOSIS — R7309 Other abnormal glucose: Secondary | ICD-10-CM

## 2015-06-26 LAB — BASIC METABOLIC PANEL
BUN: 16 mg/dL (ref 6–23)
CHLORIDE: 103 meq/L (ref 96–112)
CO2: 30 meq/L (ref 19–32)
CREATININE: 0.93 mg/dL (ref 0.40–1.20)
Calcium: 9.8 mg/dL (ref 8.4–10.5)
GFR: 66.13 mL/min (ref >60.00)
Glucose, Bld: 99 mg/dL (ref 70–99)
Potassium: 4.1 mEq/L (ref 3.5–5.1)
Sodium: 140 mEq/L (ref 135–145)

## 2015-06-26 LAB — LIPID PANEL
Cholesterol: 210 mg/dL — ABNORMAL HIGH (ref 0–200)
HDL: 86.1 mg/dL (ref >39.00)
LDL Cholesterol: 108 mg/dL — ABNORMAL HIGH (ref 0–99)
NONHDL: 124.05
Total CHOL/HDL Ratio: 2
Triglycerides: 82 mg/dL (ref 0.0–149.0)
VLDL: 16.4 mg/dL (ref 0.0–40.0)

## 2015-06-26 LAB — HEMOGLOBIN A1C: Hgb A1c MFr Bld: 5.7 % (ref 4.6–6.5)

## 2015-11-14 ENCOUNTER — Encounter: Payer: Self-pay | Admitting: Internal Medicine

## 2015-11-14 ENCOUNTER — Ambulatory Visit: Payer: BLUE CROSS/BLUE SHIELD | Admitting: Internal Medicine

## 2015-11-14 ENCOUNTER — Ambulatory Visit (INDEPENDENT_AMBULATORY_CARE_PROVIDER_SITE_OTHER): Payer: BLUE CROSS/BLUE SHIELD | Admitting: Internal Medicine

## 2015-11-14 VITALS — BP 108/60 | HR 62 | Wt 103.0 lb

## 2015-11-14 DIAGNOSIS — E785 Hyperlipidemia, unspecified: Secondary | ICD-10-CM

## 2015-11-14 DIAGNOSIS — R1013 Epigastric pain: Secondary | ICD-10-CM | POA: Diagnosis not present

## 2015-11-14 DIAGNOSIS — R928 Other abnormal and inconclusive findings on diagnostic imaging of breast: Secondary | ICD-10-CM | POA: Diagnosis not present

## 2015-11-14 DIAGNOSIS — R7309 Other abnormal glucose: Secondary | ICD-10-CM | POA: Diagnosis not present

## 2015-11-14 DIAGNOSIS — K219 Gastro-esophageal reflux disease without esophagitis: Secondary | ICD-10-CM | POA: Diagnosis not present

## 2015-11-14 LAB — BASIC METABOLIC PANEL
BUN: 13 mg/dL (ref 6–23)
CHLORIDE: 105 meq/L (ref 96–112)
CO2: 30 mEq/L (ref 19–32)
Calcium: 9.7 mg/dL (ref 8.4–10.5)
Creatinine, Ser: 0.84 mg/dL (ref 0.40–1.20)
GFR: 74.27 mL/min (ref >60.00)
Glucose, Bld: 88 mg/dL (ref 70–99)
POTASSIUM: 4.8 meq/L (ref 3.5–5.1)
Sodium: 139 mEq/L (ref 135–145)

## 2015-11-14 LAB — HEPATIC FUNCTION PANEL
ALBUMIN: 4.4 g/dL (ref 3.5–5.2)
ALT: 18 U/L (ref 0–35)
AST: 21 U/L (ref 0–37)
Alkaline Phosphatase: 63 U/L (ref 39–117)
Bilirubin, Direct: 0.1 mg/dL (ref 0.0–0.3)
Total Bilirubin: 0.4 mg/dL (ref 0.2–1.2)
Total Protein: 7 g/dL (ref 6.0–8.3)

## 2015-11-14 LAB — LIPID PANEL
CHOLESTEROL: 226 mg/dL — AB (ref 0–200)
HDL: 98.3 mg/dL (ref >39.00)
LDL CALC: 115 mg/dL — AB (ref 0–99)
NonHDL: 127.25
TRIGLYCERIDES: 62 mg/dL (ref 0.0–149.0)
Total CHOL/HDL Ratio: 2
VLDL: 12.4 mg/dL (ref 0.0–40.0)

## 2015-11-14 LAB — CBC WITH DIFFERENTIAL/PLATELET
BASOS ABS: 0 10*3/uL (ref 0.0–0.1)
Basophils Relative: 1 % (ref 0.0–3.0)
EOS PCT: 1.3 % (ref 0.0–5.0)
Eosinophils Absolute: 0.1 10*3/uL (ref 0.0–0.7)
HCT: 40.3 % (ref 36.0–46.0)
Hemoglobin: 13.5 g/dL (ref 12.0–15.0)
LYMPHS ABS: 1.7 10*3/uL (ref 0.7–4.0)
Lymphocytes Relative: 38.2 % (ref 12.0–46.0)
MCHC: 33.4 g/dL (ref 30.0–36.0)
MCV: 99.3 fl (ref 78.0–100.0)
MONOS PCT: 7.1 % (ref 3.0–12.0)
Monocytes Absolute: 0.3 10*3/uL (ref 0.1–1.0)
NEUTROS ABS: 2.3 10*3/uL (ref 1.4–7.7)
NEUTROS PCT: 52.4 % (ref 43.0–77.0)
PLATELETS: 193 10*3/uL (ref 150.0–400.0)
RBC: 4.06 Mil/uL (ref 3.87–5.11)
RDW: 13.8 % (ref 11.5–15.5)
WBC: 4.4 10*3/uL (ref 4.0–10.5)

## 2015-11-14 LAB — LIPASE: Lipase: 21 U/L (ref 11.0–59.0)

## 2015-11-14 LAB — HEMOGLOBIN A1C: HEMOGLOBIN A1C: 5.7 % (ref 4.6–6.5)

## 2015-11-14 MED ORDER — ESOMEPRAZOLE MAGNESIUM 40 MG PO CPDR: 40.0000 mg | DELAYED_RELEASE_CAPSULE | Freq: Every day | ORAL | 2 refills | Status: DC

## 2015-11-14 NOTE — Assessment & Plan Note (Signed)
Recheck met b and a1c.

## 2015-11-14 NOTE — Progress Notes (Signed)
Patient ID: Leslie Davidson, female   DOB: 09/02/1960, 55 y.o.   MRN: [id number]   Subjective:    Patient ID: Leslie Davidson, female    DOB: 04-Jun-1960, 55 y.o.   MRN: [id number]  HPI  Patient here for a scheduled follow up.  She stays active. No cardiac symptoms with increased activity or exertion. No sob.  Reports increased acid reflux.  comes up in her mouth. Burning.  Some epigastric discomfort at times.  Has been worked up previously.  Had EGD previously.  Had abdominal ultrasound.  No bowels change.  Increased stress.  Overall she feels she is handling things relatively well.     Past Medical History:  Diagnosis Date  . Arthritis   . GERD (gastroesophageal reflux disease)    Past Surgical History:  Procedure Laterality Date  . AUGMENTATION MAMMAPLASTY Bilateral    1998  . BREAST ENHANCEMENT SURGERY  2000   Family History  Problem Relation Age of Onset  . Diabetes Mother   . Cancer Father     Lung cancer  . Arthritis Maternal Grandmother    Social History   Social History  . Marital status: Married    Spouse name: N/A  . Number of children: N/A  . Years of education: N/A   Social History Main Topics  . Smoking status: Former Smoker    Types: Cigarettes    Quit date: 03/31/1983  . Smokeless tobacco: Never Used  . Alcohol use 0.0 oz/week     Comment: Occasional   . Drug use: No  . Sexual activity: Yes    Partners: Male    Birth control/ protection: Post-menopausal     Comment: Husband   Other Topics Concern  . None   Social History Narrative   Retired from the school system    Lives with husband    Children- 3- son and 2 daughters    Pets: 1 dog lives inside    Caffeine- Coffee 3-4 cups, 1 cup diet pepsi       Outpatient Encounter Prescriptions as of 11/14/2015  Medication Sig  . esomeprazole (NEXIUM) 40 MG capsule Take 1 capsule (40 mg total) by mouth daily.  . ranitidine (ZANTAC) 150 MG tablet Take 150 mg by mouth as needed.    No facility-administered  encounter medications on file as of 11/14/2015.     Review of Systems  Constitutional: Negative for appetite change and unexpected weight change.  HENT: Negative for congestion and sinus pressure.   Respiratory: Negative for cough, chest tightness and shortness of breath.   Cardiovascular: Negative for chest pain, palpitations and leg swelling.  Gastrointestinal: Negative for diarrhea, nausea and vomiting.       Increased acid reflux as outlined.  Some epigastric discomfort as outlined.    Genitourinary: Negative for difficulty urinating and dysuria.  Musculoskeletal: Negative for back pain and joint swelling.  Skin: Negative for color change and rash.  Neurological: Negative for dizziness, light-headedness and headaches.  Psychiatric/Behavioral: Negative for agitation and dysphoric mood.       Objective:    Physical Exam  Constitutional: She appears well-developed and well-nourished. No distress.  HENT:  Nose: Nose normal.  Mouth/Throat: Oropharynx is clear and moist.  Neck: Neck supple. No thyromegaly present.  Cardiovascular: Normal rate and regular rhythm.   Pulmonary/Chest: Breath sounds normal. No respiratory distress. She has no wheezes.  Abdominal: Soft. Bowel sounds are normal. There is no tenderness.  Musculoskeletal: She exhibits no edema or tenderness.  Lymphadenopathy:  She has no cervical adenopathy.  Skin: No rash noted. No erythema.  Psychiatric: She has a normal mood and affect. Her behavior is normal.    BP 108/60   Pulse 62   Wt 103 lb (46.7 kg)   LMP 01/02/2013   SpO2 98%   BMI 18.25 kg/m  Wt Readings from Last 3 Encounters:  11/14/15 103 lb (46.7 kg)  05/13/15 103 lb 8 oz (46.9 kg)  04/22/15 101 lb 12 oz (46.2 kg)     Lab Results  Component Value Date   WBC 4.4 11/14/2015   HGB 13.5 11/14/2015   HCT 40.3 11/14/2015   PLT 193.0 11/14/2015   GLUCOSE 88 11/14/2015   CHOL 226 (H) 11/14/2015   TRIG 62.0 11/14/2015   HDL 98.30 11/14/2015    LDLCALC 115 (H) 11/14/2015   ALT 18 11/14/2015   AST 21 11/14/2015   NA 139 11/14/2015   K 4.8 11/14/2015   CL 105 11/14/2015   CREATININE 0.84 11/14/2015   BUN 13 11/14/2015   CO2 30 11/14/2015   TSH 1.568 11/09/2014   HGBA1C 5.7 11/14/2015    US Breast Ltd Uni Left Inc Axilla  Result Date: 03/14/2015 CLINICAL DATA:  Short time follow-up for probably benign left breast mass. EXAM: DIGITAL DIAGNOSTIC LEFT MAMMOGRAM WITH 3D TOMOSYNTHESIS WITH CAD ULTRASOUND LEFT BREAST COMPARISON:  Previous exam(s). ACR Breast Density Category b: There are scattered areas of fibroglandular density. FINDINGS: Note that initial ultrasound was performed, however both standard and implant displaced mammograms were subsequently performed of the left breast. A subpectoral saline left breast implant is present. No suspicious masses or calcifications are seen in the left breast. The low density circumscribed 7 mm probably benign mass in the slightly inner upper left breast appears unchanged when compared to the prior exam. A similar appearing mass is seen on the MLO view of the left breast on mammograms from 2013. Mammographic images were processed with CAD. Physical examination of the upper inner left breast does not reveal any palpable masses. Targeted ultrasound of the left breast was performed demonstrating an oval hypoechoic mass with an internal echogenic component at 11 o'clock 5 cm from the nipple measuring 0.3 x 0.1 x 0.3 cm, overall similar in appearance when compared to the prior exam in felt to represent an intramammary lymph node. IMPRESSION: Unchanged probably benign left breast mass. There is no mammographic evidence of malignancy in the left breast. RECOMMENDATION: Bilateral diagnostic mammography with possible left breast ultrasound August 2017 to demonstrate 1 year stability of the probably benign left breast mass. I have discussed the findings and recommendations with the patient. Results were also provided  in writing at the conclusion of the visit. If applicable, a reminder letter will be sent to the patient regarding the next appointment. BI-RADS CATEGORY  3: Probably benign. Electronically Signed   By: Everlean Alstrom M.D.   On: 03/14/2015 15:58   Mm Diag Breast Tomo Uni Left  Result Date: 03/14/2015 CLINICAL DATA:  Short time follow-up for probably benign left breast mass. EXAM: DIGITAL DIAGNOSTIC LEFT MAMMOGRAM WITH 3D TOMOSYNTHESIS WITH CAD ULTRASOUND LEFT BREAST COMPARISON:  Previous exam(s). ACR Breast Density Category b: There are scattered areas of fibroglandular density. FINDINGS: Note that initial ultrasound was performed, however both standard and implant displaced mammograms were subsequently performed of the left breast. A subpectoral saline left breast implant is present. No suspicious masses or calcifications are seen in the left breast. The low density circumscribed 7 mm probably benign mass  in the slightly inner upper left breast appears unchanged when compared to the prior exam. A similar appearing mass is seen on the MLO view of the left breast on mammograms from 2013. Mammographic images were processed with CAD. Physical examination of the upper inner left breast does not reveal any palpable masses. Targeted ultrasound of the left breast was performed demonstrating an oval hypoechoic mass with an internal echogenic component at 11 o'clock 5 cm from the nipple measuring 0.3 x 0.1 x 0.3 cm, overall similar in appearance when compared to the prior exam in felt to represent an intramammary lymph node. IMPRESSION: Unchanged probably benign left breast mass. There is no mammographic evidence of malignancy in the left breast. RECOMMENDATION: Bilateral diagnostic mammography with possible left breast ultrasound August 2017 to demonstrate 1 year stability of the probably benign left breast mass. I have discussed the findings and recommendations with the patient. Results were also provided in writing  at the conclusion of the visit. If applicable, a reminder letter will be sent to the patient regarding the next appointment. BI-RADS CATEGORY  3: Probably benign. Electronically Signed   By: Everlean Alstrom M.D.   On: 03/14/2015 15:58       Assessment & Plan:   Problem List Items Addressed This Visit    Abdominal pain, epigastric    Treat acid reflux with nexium.  Follow.  Get her back in soon to reassess.  Check cbc, liver panel and lipase.  Hold on scanning.       Relevant Orders   CBC with Differential/Platelet (Completed)   Hepatic function panel (Completed)   Basic metabolic panel (Completed)   Lipase (Completed)   Abnormal mammogram    She is scheduled for f/u mammogram and ultrasound in 10/2015 (11/25/15).        Elevated glucose level - Primary    Recheck met b and a1c.       Relevant Orders   Hemoglobin A1c (Completed)   GERD (gastroesophageal reflux disease)    Worsening acid reflux.  Start nexium 83m q day.  Recheck soon to confirm all symptoms resolved.        Relevant Medications   esomeprazole (NEXIUM) 40 MG capsule   Hyperlipidemia    Check lipid panel.        Relevant Orders   Lipid panel (Completed)    Other Visit Diagnoses   None.      SEinar Pheasant MD

## 2015-11-14 NOTE — Assessment & Plan Note (Signed)
Worsening acid reflux.  Start nexium 40mg  q day.  Recheck soon to confirm all symptoms resolved.

## 2015-11-15 ENCOUNTER — Encounter: Payer: Self-pay | Admitting: Internal Medicine

## 2015-11-15 NOTE — Assessment & Plan Note (Signed)
Check lipid panel  

## 2015-11-15 NOTE — Assessment & Plan Note (Signed)
Treat acid reflux with nexium.  Follow.  Get her back in soon to reassess.  Check cbc, liver panel and lipase.  Hold on scanning.

## 2015-11-15 NOTE — Assessment & Plan Note (Signed)
She is scheduled for f/u mammogram and ultrasound in 10/2015 (11/25/15).

## 2015-11-25 ENCOUNTER — Other Ambulatory Visit: Payer: Self-pay | Admitting: Internal Medicine

## 2015-11-25 ENCOUNTER — Ambulatory Visit
Admission: RE | Admit: 2015-11-25 | Discharge: 2015-11-25 | Disposition: A | Discharge status: Home / Self Care | Payer: BLUE CROSS/BLUE SHIELD | Source: Ambulatory Visit | Attending: Internal Medicine | Admitting: Internal Medicine

## 2015-11-25 DIAGNOSIS — N63 Unspecified lump in breast: Secondary | ICD-10-CM | POA: Diagnosis not present

## 2015-11-25 DIAGNOSIS — R928 Other abnormal and inconclusive findings on diagnostic imaging of breast: Secondary | ICD-10-CM

## 2016-02-04 ENCOUNTER — Ambulatory Visit (INDEPENDENT_AMBULATORY_CARE_PROVIDER_SITE_OTHER): Payer: BLUE CROSS/BLUE SHIELD | Admitting: Internal Medicine

## 2016-02-04 ENCOUNTER — Encounter: Payer: Self-pay | Admitting: Internal Medicine

## 2016-02-04 DIAGNOSIS — Z23 Encounter for immunization: Secondary | ICD-10-CM | POA: Diagnosis not present

## 2016-02-04 DIAGNOSIS — K219 Gastro-esophageal reflux disease without esophagitis: Secondary | ICD-10-CM | POA: Diagnosis not present

## 2016-02-04 DIAGNOSIS — R928 Other abnormal and inconclusive findings on diagnostic imaging of breast: Secondary | ICD-10-CM

## 2016-02-04 NOTE — Progress Notes (Signed)
Pre visit review using our clinic review tool, if applicable. No additional management support is needed unless otherwise documented below in the visit note. 

## 2016-02-04 NOTE — Progress Notes (Signed)
Patient ID: Leslie Davidson, female   DOB: Jul 23, 1960, 55 y.o.   MRN: [id number]   Subjective:    Patient ID: Leslie Davidson, female    DOB: 03/26/1961, 55 y.o.   MRN: [id number]  HPI  Patient here for a scheduled follow up.  Last visit was having problems with increased acid reflux.  Started on nexium.  States her symptoms have resolved.  Is off now and has had no reoccurrence.  Feels better.  Is eating.  No GI symptoms.  Specifically denies any nausea, vomiting, diarrhea, abdominal pain or cramping.  Stays active.  No chest pain.  No sob.  Overall feels good.  She did discuss some concerns she had regarding her mother driving.  Her mother is also my pt.  Due to see soon.    Past Medical History:  Diagnosis Date  . Arthritis   . GERD (gastroesophageal reflux disease)    Past Surgical History:  Procedure Laterality Date  . AUGMENTATION MAMMAPLASTY Bilateral    2000  . BREAST ENHANCEMENT SURGERY  2000   Family History  Problem Relation Age of Onset  . Diabetes Mother   . Cancer Father     Lung cancer  . Arthritis Maternal Grandmother    Social History   Social History  . Marital status: Married    Spouse name: N/A  . Number of children: N/A  . Years of education: N/A   Social History Main Topics  . Smoking status: Former Smoker    Types: Cigarettes    Quit date: 03/31/1983  . Smokeless tobacco: Never Used  . Alcohol use 0.0 oz/week     Comment: Occasional   . Drug use: No  . Sexual activity: Yes    Partners: Male    Birth control/ protection: Post-menopausal     Comment: Husband   Other Topics Concern  . None   Social History Narrative   Retired from the school system    Lives with husband    Children- 3- son and 2 daughters    Pets: 1 dog lives inside    Caffeine- Coffee 3-4 cups, 1 cup diet pepsi       Outpatient Encounter Prescriptions as of 02/04/2016  Medication Sig  . esomeprazole (NEXIUM) 40 MG capsule Take 1 capsule (40 mg total) by mouth daily.  .  [DISCONTINUED] ranitidine (ZANTAC) 150 MG tablet Take 150 mg by mouth as needed.    No facility-administered encounter medications on file as of 02/04/2016.     Review of Systems  Constitutional: Negative for appetite change and unexpected weight change.  Respiratory: Negative for cough, chest tightness and shortness of breath.   Cardiovascular: Negative for chest pain, palpitations and leg swelling.  Gastrointestinal: Negative for abdominal pain, diarrhea, nausea and vomiting.  Genitourinary: Negative for pelvic pain.       Objective:    Physical Exam  Constitutional: She appears well-developed and well-nourished. No distress.  HENT:  Nose: Nose normal.  Mouth/Throat: Oropharynx is clear and moist.  Neck: Neck supple. No thyromegaly present.  Cardiovascular: Normal rate and regular rhythm.   Pulmonary/Chest: Breath sounds normal. No respiratory distress. She has no wheezes.  Abdominal: Soft. Bowel sounds are normal. There is no tenderness.  Musculoskeletal: She exhibits no edema or tenderness.  Lymphadenopathy:    She has no cervical adenopathy.  Skin: No rash noted. No erythema.  Psychiatric: She has a normal mood and affect.    BP 100/66   Pulse 62   Temp  98.2 F (36.8 C) (Oral)   Ht 5\' 3"  (1.6 m)   Wt 103 lb 6.4 oz (46.9 kg)   LMP 01/02/2013   SpO2 99%   BMI 18.32 kg/m  Wt Readings from Last 3 Encounters:  02/04/16 103 lb 6.4 oz (46.9 kg)  11/14/15 103 lb (46.7 kg)  05/13/15 103 lb 8 oz (46.9 kg)     Lab Results  Component Value Date   WBC 4.4 11/14/2015   HGB 13.5 11/14/2015   HCT 40.3 11/14/2015   PLT 193.0 11/14/2015   GLUCOSE 88 11/14/2015   CHOL 226 (H) 11/14/2015   TRIG 62.0 11/14/2015   HDL 98.30 11/14/2015   LDLCALC 115 (H) 11/14/2015   ALT 18 11/14/2015   AST 21 11/14/2015   NA 139 11/14/2015   K 4.8 11/14/2015   CL 105 11/14/2015   CREATININE 0.84 11/14/2015   BUN 13 11/14/2015   CO2 30 11/14/2015   TSH 1.568 11/09/2014   HGBA1C 5.7  11/14/2015    Mm Diag Breast W/implant Tomo Bilateral  Result Date: 11/25/2015 CLINICAL DATA:  Patient for short-term follow-up probably benign left breast mass. EXAM: 2D DIGITAL DIAGNOSTIC BILATERAL MAMMOGRAM WITH IMPLANTS, CAD AND ADJUNCT TOMO The patient has retropectoral implants. Standard and implant displaced views were performed. COMPARISON:  Previous exam(s). ACR Breast Density Category c: The breast tissue is heterogeneously dense, which may obscure small masses. FINDINGS: Unchanged 7 mm oval circumscribed low-density mass within the medial aspect of the left breast. No additional concerning masses, calcifications or areas of architectural distortion identified within either breast. Mammographic images were processed with CAD. IMPRESSION: Stable probably benign left breast mass. RECOMMENDATION: Bilateral diagnostic mammography in 12 months to demonstrate 2 years of stability. I have discussed the findings and recommendations with the patient. Results were also provided in writing at the conclusion of the visit. If applicable, a reminder letter will be sent to the patient regarding the next appointment. BI-RADS CATEGORY  3: Probably benign. Electronically Signed   By: Lovey Newcomer M.D.   On: 11/25/2015 14:23       Assessment & Plan:   Problem List Items Addressed This Visit    Abnormal mammogram    Had f/u mammogram 11/25/15 - birads III.  Recommended f/u diagnostic bilateral mammogram in 12 months.        GERD (gastroesophageal reflux disease)    Resolved on nexium.  Off now.  Discussed if had any return of symptoms to notify me.  Would require further w/up.  Overall feels good.  No symptoms now.         Other Visit Diagnoses    Encounter for immunization       Relevant Orders   Flu Vaccine QUAD 36+ mos IM (Completed)       Einar Pheasant, MD

## 2016-02-10 ENCOUNTER — Encounter: Payer: Self-pay | Admitting: Internal Medicine

## 2016-02-10 NOTE — Assessment & Plan Note (Signed)
Had f/u mammogram 11/25/15 - birads III.  Recommended f/u diagnostic bilateral mammogram in 12 months.

## 2016-02-10 NOTE — Assessment & Plan Note (Signed)
Resolved on nexium.  Off now.  Discussed if had any return of symptoms to notify me.  Would require further w/up.  Overall feels good.  No symptoms now.

## 2016-07-14 DIAGNOSIS — R51 Headache: Secondary | ICD-10-CM | POA: Diagnosis not present

## 2016-07-14 DIAGNOSIS — M9902 Segmental and somatic dysfunction of thoracic region: Secondary | ICD-10-CM | POA: Diagnosis not present

## 2016-07-14 DIAGNOSIS — M62830 Muscle spasm of back: Secondary | ICD-10-CM | POA: Diagnosis not present

## 2016-07-14 DIAGNOSIS — M9901 Segmental and somatic dysfunction of cervical region: Secondary | ICD-10-CM | POA: Diagnosis not present

## 2016-07-15 DIAGNOSIS — M9902 Segmental and somatic dysfunction of thoracic region: Secondary | ICD-10-CM | POA: Diagnosis not present

## 2016-07-15 DIAGNOSIS — M62830 Muscle spasm of back: Secondary | ICD-10-CM | POA: Diagnosis not present

## 2016-07-15 DIAGNOSIS — M9901 Segmental and somatic dysfunction of cervical region: Secondary | ICD-10-CM | POA: Diagnosis not present

## 2016-07-15 DIAGNOSIS — R51 Headache: Secondary | ICD-10-CM | POA: Diagnosis not present

## 2016-07-17 DIAGNOSIS — M62830 Muscle spasm of back: Secondary | ICD-10-CM | POA: Diagnosis not present

## 2016-07-17 DIAGNOSIS — M9902 Segmental and somatic dysfunction of thoracic region: Secondary | ICD-10-CM | POA: Diagnosis not present

## 2016-07-17 DIAGNOSIS — M9901 Segmental and somatic dysfunction of cervical region: Secondary | ICD-10-CM | POA: Diagnosis not present

## 2016-07-17 DIAGNOSIS — R51 Headache: Secondary | ICD-10-CM | POA: Diagnosis not present

## 2016-07-21 DIAGNOSIS — M9902 Segmental and somatic dysfunction of thoracic region: Secondary | ICD-10-CM | POA: Diagnosis not present

## 2016-07-21 DIAGNOSIS — M62830 Muscle spasm of back: Secondary | ICD-10-CM | POA: Diagnosis not present

## 2016-07-21 DIAGNOSIS — M9901 Segmental and somatic dysfunction of cervical region: Secondary | ICD-10-CM | POA: Diagnosis not present

## 2016-07-21 DIAGNOSIS — R51 Headache: Secondary | ICD-10-CM | POA: Diagnosis not present

## 2016-07-22 DIAGNOSIS — M62830 Muscle spasm of back: Secondary | ICD-10-CM | POA: Diagnosis not present

## 2016-07-22 DIAGNOSIS — M9902 Segmental and somatic dysfunction of thoracic region: Secondary | ICD-10-CM | POA: Diagnosis not present

## 2016-07-22 DIAGNOSIS — R51 Headache: Secondary | ICD-10-CM | POA: Diagnosis not present

## 2016-07-22 DIAGNOSIS — M9901 Segmental and somatic dysfunction of cervical region: Secondary | ICD-10-CM | POA: Diagnosis not present

## 2016-07-24 DIAGNOSIS — M9902 Segmental and somatic dysfunction of thoracic region: Secondary | ICD-10-CM | POA: Diagnosis not present

## 2016-07-24 DIAGNOSIS — M62830 Muscle spasm of back: Secondary | ICD-10-CM | POA: Diagnosis not present

## 2016-07-24 DIAGNOSIS — R51 Headache: Secondary | ICD-10-CM | POA: Diagnosis not present

## 2016-07-24 DIAGNOSIS — M9901 Segmental and somatic dysfunction of cervical region: Secondary | ICD-10-CM | POA: Diagnosis not present

## 2016-07-25 DIAGNOSIS — H1013 Acute atopic conjunctivitis, bilateral: Secondary | ICD-10-CM | POA: Diagnosis not present

## 2016-07-28 DIAGNOSIS — M9902 Segmental and somatic dysfunction of thoracic region: Secondary | ICD-10-CM | POA: Diagnosis not present

## 2016-07-28 DIAGNOSIS — R51 Headache: Secondary | ICD-10-CM | POA: Diagnosis not present

## 2016-07-28 DIAGNOSIS — M62830 Muscle spasm of back: Secondary | ICD-10-CM | POA: Diagnosis not present

## 2016-07-28 DIAGNOSIS — M9901 Segmental and somatic dysfunction of cervical region: Secondary | ICD-10-CM | POA: Diagnosis not present

## 2016-07-29 DIAGNOSIS — M9901 Segmental and somatic dysfunction of cervical region: Secondary | ICD-10-CM | POA: Diagnosis not present

## 2016-07-29 DIAGNOSIS — R51 Headache: Secondary | ICD-10-CM | POA: Diagnosis not present

## 2016-07-29 DIAGNOSIS — M62830 Muscle spasm of back: Secondary | ICD-10-CM | POA: Diagnosis not present

## 2016-07-29 DIAGNOSIS — M9902 Segmental and somatic dysfunction of thoracic region: Secondary | ICD-10-CM | POA: Diagnosis not present

## 2016-07-31 DIAGNOSIS — M9901 Segmental and somatic dysfunction of cervical region: Secondary | ICD-10-CM | POA: Diagnosis not present

## 2016-07-31 DIAGNOSIS — R51 Headache: Secondary | ICD-10-CM | POA: Diagnosis not present

## 2016-07-31 DIAGNOSIS — M9902 Segmental and somatic dysfunction of thoracic region: Secondary | ICD-10-CM | POA: Diagnosis not present

## 2016-07-31 DIAGNOSIS — M62830 Muscle spasm of back: Secondary | ICD-10-CM | POA: Diagnosis not present

## 2016-08-03 ENCOUNTER — Encounter: Payer: BLUE CROSS/BLUE SHIELD | Admitting: Internal Medicine

## 2016-08-20 ENCOUNTER — Encounter: Payer: Self-pay | Admitting: Internal Medicine

## 2016-08-20 ENCOUNTER — Ambulatory Visit (INDEPENDENT_AMBULATORY_CARE_PROVIDER_SITE_OTHER): Payer: BLUE CROSS/BLUE SHIELD | Admitting: Internal Medicine

## 2016-08-20 VITALS — BP 102/64 | HR 75 | Temp 98.6°F | Resp 12 | Ht 63.0 in | Wt 100.4 lb

## 2016-08-20 DIAGNOSIS — Z9289 Personal history of other medical treatment: Secondary | ICD-10-CM | POA: Diagnosis not present

## 2016-08-20 DIAGNOSIS — Z0000 Encounter for general adult medical examination without abnormal findings: Secondary | ICD-10-CM | POA: Diagnosis not present

## 2016-08-20 DIAGNOSIS — Z1211 Encounter for screening for malignant neoplasm of colon: Secondary | ICD-10-CM | POA: Diagnosis not present

## 2016-08-20 DIAGNOSIS — R928 Other abnormal and inconclusive findings on diagnostic imaging of breast: Secondary | ICD-10-CM

## 2016-08-20 DIAGNOSIS — E785 Hyperlipidemia, unspecified: Secondary | ICD-10-CM

## 2016-08-20 DIAGNOSIS — Z13820 Encounter for screening for osteoporosis: Secondary | ICD-10-CM | POA: Diagnosis not present

## 2016-08-20 NOTE — Progress Notes (Signed)
Pre-visit discussion using our clinic review tool. No additional management support is needed unless otherwise documented below in the visit note.  

## 2016-08-20 NOTE — Progress Notes (Signed)
Patient ID: Leslie Davidson, female   DOB: Dec 19, 1960, 56 y.o.   MRN: [id number]   Subjective:    Patient ID: Leslie Davidson, female    DOB: 1960/07/27, 56 y.o.   MRN: [id number]  HPI  Patient here for her physical exam.  She has been having some low back pain and shoulder pain.  Saw chiropractor.  Better initially.  Feels worsened after the last visit.  Doing stretches.  Discussed further w/up and evaluation.  She declines.  Wants to try stretches and see if improves.  No chest pain.  No sob.  No acid reflux.  No abdominal pain.  Bowels moving.  Increased stress.  Feels she is handling things relatively well.      Past Medical History:  Diagnosis Date  . Arthritis   . GERD (gastroesophageal reflux disease)    Past Surgical History:  Procedure Laterality Date  . AUGMENTATION MAMMAPLASTY Bilateral    2000  . BREAST ENHANCEMENT SURGERY  2000   Family History  Problem Relation Age of Onset  . Diabetes Mother   . Cancer Father        Lung cancer  . Arthritis Maternal Grandmother    Social History   Social History  . Marital status: Married    Spouse name: N/A  . Number of children: N/A  . Years of education: N/A   Social History Main Topics  . Smoking status: Former Smoker    Types: Cigarettes    Quit date: 03/31/1983  . Smokeless tobacco: Never Used  . Alcohol use 0.0 oz/week     Comment: Occasional   . Drug use: No  . Sexual activity: Yes    Partners: Male    Birth control/ protection: Post-menopausal     Comment: Husband   Other Topics Concern  . None   Social History Narrative   Retired from the school system    Lives with husband    Children- 3- son and 2 daughters    Pets: 1 dog lives inside    Caffeine- Coffee 3-4 cups, 1 cup diet pepsi       Outpatient Encounter Prescriptions as of 08/20/2016  Medication Sig  . ciprofloxacin (CILOXAN) 0.3 % ophthalmic solution Place 1 drop into both eyes daily as needed.  . [DISCONTINUED] esomeprazole (NEXIUM) 40 MG capsule  Take 1 capsule (40 mg total) by mouth daily.   No facility-administered encounter medications on file as of 08/20/2016.     Review of Systems  Constitutional: Negative for appetite change and unexpected weight change.  HENT: Negative for congestion and sinus pressure.   Eyes: Negative for pain and visual disturbance.  Respiratory: Negative for cough, chest tightness and shortness of breath.   Cardiovascular: Negative for chest pain, palpitations and leg swelling.  Gastrointestinal: Negative for abdominal pain, diarrhea, nausea and vomiting.  Genitourinary: Negative for difficulty urinating and dysuria.  Musculoskeletal: Positive for back pain. Negative for joint swelling.  Skin: Negative for color change and rash.  Neurological: Negative for dizziness, light-headedness and headaches.  Hematological: Negative for adenopathy. Does not bruise/bleed easily.  Psychiatric/Behavioral: Negative for agitation and dysphoric mood.       Objective:    Physical Exam  Constitutional: She is oriented to person, place, and time. She appears well-developed and well-nourished. She appears distressed.  HENT:  Nose: Nose normal.  Mouth/Throat: Oropharynx is clear and moist.  Eyes: Right eye exhibits no discharge. Left eye exhibits no discharge. No scleral icterus.  Neck: Neck supple. No  thyromegaly present.  Cardiovascular: Normal rate and regular rhythm.   Pulmonary/Chest: Breath sounds normal. No accessory muscle usage. No tachypnea. No respiratory distress. She has no decreased breath sounds. She has no wheezes. She has no rhonchi. Right breast exhibits no inverted nipple, no mass, no nipple discharge and no tenderness (no axillary adenopathy). Left breast exhibits no inverted nipple, no mass, no nipple discharge and no tenderness (no axilarry adenopathy).  Bilateral breast implants.   Abdominal: Soft. Bowel sounds are normal. There is no tenderness.  Musculoskeletal: She exhibits no edema or  tenderness.  Lymphadenopathy:    She has no cervical adenopathy.  Neurological: She is alert and oriented to person, place, and time.  Skin: Skin is warm. No rash noted. No erythema.  Psychiatric: She has a normal mood and affect. Her behavior is normal.    BP 102/64 (BP Location: Left Arm, Patient Position: Sitting, Cuff Size: Normal)   Pulse 75   Temp 98.6 F (37 C) (Oral)   Resp 12   Ht 5\' 3"  (1.6 m)   Wt 100 lb 6.4 oz (45.5 kg)   LMP 01/02/2013   SpO2 98%   BMI 17.79 kg/m  Wt Readings from Last 3 Encounters:  08/20/16 100 lb 6.4 oz (45.5 kg)  02/04/16 103 lb 6.4 oz (46.9 kg)  11/14/15 103 lb (46.7 kg)     Lab Results  Component Value Date   WBC 4.4 11/14/2015   HGB 13.5 11/14/2015   HCT 40.3 11/14/2015   PLT 193.0 11/14/2015   GLUCOSE 88 11/14/2015   CHOL 226 (H) 11/14/2015   TRIG 62.0 11/14/2015   HDL 98.30 11/14/2015   LDLCALC 115 (H) 11/14/2015   ALT 18 11/14/2015   AST 21 11/14/2015   NA 139 11/14/2015   K 4.8 11/14/2015   CL 105 11/14/2015   CREATININE 0.84 11/14/2015   BUN 13 11/14/2015   CO2 30 11/14/2015   TSH 1.568 11/09/2014   HGBA1C 5.7 11/14/2015    Mm Diag Breast W/implant Tomo Bilateral  Result Date: 11/25/2015 CLINICAL DATA:  Patient for short-term follow-up probably benign left breast mass. EXAM: 2D DIGITAL DIAGNOSTIC BILATERAL MAMMOGRAM WITH IMPLANTS, CAD AND ADJUNCT TOMO The patient has retropectoral implants. Standard and implant displaced views were performed. COMPARISON:  Previous exam(s). ACR Breast Density Category c: The breast tissue is heterogeneously dense, which may obscure small masses. FINDINGS: Unchanged 7 mm oval circumscribed low-density mass within the medial aspect of the left breast. No additional concerning masses, calcifications or areas of architectural distortion identified within either breast. Mammographic images were processed with CAD. IMPRESSION: Stable probably benign left breast mass. RECOMMENDATION: Bilateral  diagnostic mammography in 12 months to demonstrate 2 years of stability. I have discussed the findings and recommendations with the patient. Results were also provided in writing at the conclusion of the visit. If applicable, a reminder letter will be sent to the patient regarding the next appointment. BI-RADS CATEGORY  3: Probably benign. Electronically Signed   By: Lovey Newcomer M.D.   On: 11/25/2015 14:23       Assessment & Plan:   Problem List Items Addressed This Visit    Abnormal mammogram    Had f/u mammogram 11/25/15 - Birads III.  Recommended f/u diagnostic bilateral mammogram in 12 months.        Healthcare maintenance    Physical today 08/21/15.  Mammogram as outlined 11/25/15.  Colonoscopy 2010.  IFOB.  Schedule bone density.        Hyperlipidemia  Low cholesterol diet and exercise.  Follow lipid panel.         Other Visit Diagnoses    Encounter for screening for osteoporosis    -  Primary   Relevant Orders   DG Bone Density   History of diagnostic mammography       Relevant Orders   MM DIAG BREAST W/IMPLANT TOMO BILATERAL   Colon cancer screening       Relevant Orders   Fecal occult blood, imunochemical       Einar Pheasant, MD

## 2016-08-22 ENCOUNTER — Encounter: Payer: Self-pay | Admitting: Internal Medicine

## 2016-08-22 DIAGNOSIS — Z0000 Encounter for general adult medical examination without abnormal findings: Secondary | ICD-10-CM | POA: Insufficient documentation

## 2016-08-22 NOTE — Assessment & Plan Note (Signed)
Physical today 08/21/15.  Mammogram as outlined 11/25/15.  Colonoscopy 2010.  IFOB.  Schedule bone density.

## 2016-08-22 NOTE — Assessment & Plan Note (Signed)
Low cholesterol diet and exercise.  Follow lipid panel.   

## 2016-08-22 NOTE — Assessment & Plan Note (Signed)
Had f/u mammogram 11/25/15 - Birads III.  Recommended f/u diagnostic bilateral mammogram in 12 months.

## 2016-10-12 ENCOUNTER — Telehealth: Payer: Self-pay | Admitting: *Deleted

## 2016-10-12 MED ORDER — ESOMEPRAZOLE MAGNESIUM 40 MG PO CPDR: 40.0000 mg | DELAYED_RELEASE_CAPSULE | Freq: Every day | ORAL | 3 refills | Status: DC

## 2016-10-12 NOTE — Telephone Encounter (Signed)
I have sent in rx for nexium with  Refills.  Confirm pt having acid reflux and no other symptoms.  Confirm no vomiting, abdominal pain or any other acute issues.  Will restart nexium.  Also, given return of symptoms, will she be agreeable to referral for EGD.  If so, let me know and I will place the order for the referral.  If no and no acute symptoms, start nexium and schedule f/u with me in the next 8 weeks.

## 2016-10-12 NOTE — Telephone Encounter (Signed)
Patient is currently having a acid reflux flare up, and is requesting to have a medication and phone consult  Pt contact [phone]

## 2016-10-12 NOTE — Telephone Encounter (Signed)
Called patient states that she has started back on Nexium for reflux and would like to have script called in. She has some to last until next week. Pt informed that you are out of the office until Wednesday and was fine with that.

## 2016-10-13 NOTE — Telephone Encounter (Signed)
See below

## 2016-10-13 NOTE — Telephone Encounter (Signed)
Noted.  As per previous note, needs f/u appt scheduled in 8 weeks to discuss.

## 2016-10-13 NOTE — Telephone Encounter (Signed)
Left message to return call to our office.  

## 2016-10-13 NOTE — Telephone Encounter (Signed)
Pt stated that she has no other symptoms to report , pt confirmed that she does not have vomiting, abdominal pain or any other acute issues. She would like to talk with Dr. Nicki Reaper at her next appt in reference  to having a EGD referral .

## 2016-10-14 NOTE — Telephone Encounter (Signed)
Left message to return call to our office.  

## 2016-10-19 NOTE — Telephone Encounter (Signed)
Mychart message sent.

## 2016-11-25 DIAGNOSIS — R399 Unspecified symptoms and signs involving the genitourinary system: Secondary | ICD-10-CM | POA: Diagnosis not present

## 2016-11-26 ENCOUNTER — Ambulatory Visit
Admission: RE | Admit: 2016-11-26 | Discharge: 2016-11-26 | Disposition: A | Discharge status: Home / Self Care | Payer: BLUE CROSS/BLUE SHIELD | Source: Ambulatory Visit | Attending: Internal Medicine | Admitting: Internal Medicine

## 2016-11-26 DIAGNOSIS — Z13820 Encounter for screening for osteoporosis: Secondary | ICD-10-CM

## 2016-11-26 DIAGNOSIS — R928 Other abnormal and inconclusive findings on diagnostic imaging of breast: Secondary | ICD-10-CM | POA: Diagnosis not present

## 2016-11-26 DIAGNOSIS — Z9289 Personal history of other medical treatment: Secondary | ICD-10-CM | POA: Insufficient documentation

## 2016-11-26 DIAGNOSIS — Z9882 Breast implant status: Secondary | ICD-10-CM | POA: Diagnosis not present

## 2016-12-04 DIAGNOSIS — R319 Hematuria, unspecified: Secondary | ICD-10-CM | POA: Diagnosis not present

## 2016-12-04 DIAGNOSIS — N390 Urinary tract infection, site not specified: Secondary | ICD-10-CM | POA: Diagnosis not present

## 2016-12-04 DIAGNOSIS — R399 Unspecified symptoms and signs involving the genitourinary system: Secondary | ICD-10-CM | POA: Diagnosis not present

## 2016-12-30 ENCOUNTER — Encounter: Payer: Self-pay | Admitting: Internal Medicine

## 2017-01-31 ENCOUNTER — Other Ambulatory Visit: Payer: Self-pay | Admitting: Internal Medicine

## 2017-03-01 ENCOUNTER — Ambulatory Visit: Payer: BLUE CROSS/BLUE SHIELD | Admitting: Internal Medicine

## 2017-03-01 ENCOUNTER — Encounter: Payer: Self-pay | Admitting: Internal Medicine

## 2017-03-01 VITALS — BP 108/70 | HR 64 | Temp 97.8°F | Resp 14 | Ht 63.0 in | Wt 101.5 lb

## 2017-03-01 DIAGNOSIS — R319 Hematuria, unspecified: Secondary | ICD-10-CM | POA: Diagnosis not present

## 2017-03-01 DIAGNOSIS — K219 Gastro-esophageal reflux disease without esophagitis: Secondary | ICD-10-CM

## 2017-03-01 NOTE — Progress Notes (Signed)
Patient ID: Leslie Davidson, female   DOB: [birth year], 58 y.o.   MRN: [id number]   Subjective:    Patient ID: Leslie Davidson, female    DOB: July 28, 1960, 56 y.o.   MRN: [id number]  HPI  Patient here for a scheduled follow up.  Increased stress with her mother's medical issues.  Discussed with her today.  Overall she feels she is handling things relatively well.  Stays active.  Exercises.  No chest pain.  No sob.  States if she watches what she eats, she has no problems with acid reflux.  No abdominal pain.  Bowels moving.  Recent uti.  Treated.  No symptoms or urinary odor now.  Discussed the need for f/u urinalysis given hematuria.     Past Medical History:  Diagnosis Date  . Arthritis   . GERD (gastroesophageal reflux disease)    Past Surgical History:  Procedure Laterality Date  . AUGMENTATION MAMMAPLASTY Bilateral    2000  . BREAST ENHANCEMENT SURGERY  2000   Family History  Problem Relation Age of Onset  . Diabetes Mother   . Cancer Father        Lung cancer  . Arthritis Maternal Grandmother   . Breast cancer Neg Hx    Social History   Socioeconomic History  . Marital status: Married    Spouse name: None  . Number of children: None  . Years of education: None  . Highest education level: None  Social Needs  . Financial resource strain: None  . Food insecurity - worry: None  . Food insecurity - inability: None  . Transportation needs - medical: None  . Transportation needs - non-medical: None  Occupational History  . None  Tobacco Use  . Smoking status: Former Smoker    Types: Cigarettes    Last attempt to quit: 03/31/1983    Years since quitting: 33.9  . Smokeless tobacco: Never Used  Substance and Sexual Activity  . Alcohol use: Yes    Alcohol/week: 0.0 oz    Comment: Occasional   . Drug use: No  . Sexual activity: Yes    Partners: Male    Birth control/protection: Post-menopausal    Comment: Husband  Other Topics Concern  . None  Social History Narrative   Retired from the school system    Lives with husband    Children- 3- son and 2 daughters    Pets: 1 dog lives inside    Caffeine- Coffee 3-4 cups, 1 cup diet pepsi    Outpatient Encounter Medications as of 03/01/2017  Medication Sig  . Probiotic Product (PROBIOTIC PO) Take by mouth.  . [DISCONTINUED] ciprofloxacin (CILOXAN) 0.3 % ophthalmic solution Place 1 drop into both eyes daily as needed.  . [DISCONTINUED] esomeprazole (NEXIUM) 40 MG capsule TAKE 1 CAPSULE BY MOUTH EVERY DAY   No facility-administered encounter medications on file as of 03/01/2017.     Review of Systems  Constitutional: Negative for appetite change and unexpected weight change.  HENT: Negative for congestion and sinus pressure.   Respiratory: Negative for cough, chest tightness and shortness of breath.   Cardiovascular: Negative for chest pain, palpitations and leg swelling.  Gastrointestinal: Negative for abdominal pain, diarrhea, nausea and vomiting.  Genitourinary: Negative for difficulty urinating and dysuria.  Musculoskeletal: Negative for joint swelling and myalgias.  Skin: Negative for color change and rash.  Neurological: Negative for dizziness, light-headedness and headaches.  Psychiatric/Behavioral: Negative for agitation and dysphoric mood.       Increased stress  as outlined.         Objective:     Blood pressure rechecked by me:  110/78  Physical Exam  Constitutional: She appears well-developed and well-nourished. No distress.  HENT:  Nose: Nose normal.  Mouth/Throat: Oropharynx is clear and moist.  Neck: Neck supple. No thyromegaly present.  Cardiovascular: Normal rate and regular rhythm.  Pulmonary/Chest: Breath sounds normal. No respiratory distress. She has no wheezes.  Abdominal: Soft. Bowel sounds are normal. There is no tenderness.  Musculoskeletal: She exhibits no edema or tenderness.  Lymphadenopathy:    She has no cervical adenopathy.  Skin: No rash noted. No erythema.    Psychiatric: She has a normal mood and affect. Her behavior is normal.    BP 108/70   Pulse 64   Temp 97.8 F (36.6 C)   Resp 14   Ht 5\' 3"  (1.6 m)   Wt 101 lb 8 oz (46 kg)   LMP 01/02/2013   SpO2 99%   BMI 17.98 kg/m  Wt Readings from Last 3 Encounters:  03/01/17 101 lb 8 oz (46 kg)  08/20/16 100 lb 6.4 oz (45.5 kg)  02/04/16 103 lb 6.4 oz (46.9 kg)     Lab Results  Component Value Date   WBC 4.4 11/14/2015   HGB 13.5 11/14/2015   HCT 40.3 11/14/2015   PLT 193.0 11/14/2015   GLUCOSE 88 11/14/2015   CHOL 226 (H) 11/14/2015   TRIG 62.0 11/14/2015   HDL 98.30 11/14/2015   LDLCALC 115 (H) 11/14/2015   ALT 18 11/14/2015   AST 21 11/14/2015   NA 139 11/14/2015   K 4.8 11/14/2015   CL 105 11/14/2015   CREATININE 0.84 11/14/2015   BUN 13 11/14/2015   CO2 30 11/14/2015   TSH 1.568 11/09/2014   HGBA1C 5.7 11/14/2015    Mm Diag Breast W/implant Tomo Bilateral  Result Date: 11/26/2016 CLINICAL DATA:  56 year old female for left breast follow-up EXAM: 2D DIGITAL DIAGNOSTIC LEFT MAMMOGRAM WITH IMPLANTS, CAD AND ADJUNCT TOMO The patient has bilateral retropectoral saline implants. Standard and implant displaced views were performed. COMPARISON:  Previous exam(s). ACR Breast Density Category b: There are scattered areas of fibroglandular density. FINDINGS: The previously noted oval, circumscribed 7 mm mass within the upper, inner left breast is unchanged dating back to the CC view of the patient's 2016 mammogram and MLO view of the patient's 2013 mammogram, consistent with a benign etiology. No suspicious mass, calcifications, or other abnormality is identified within either breast. Mammographic images were processed with CAD. IMPRESSION: No mammographic evidence of malignancy. RECOMMENDATION: Screening mammogram in one year.(Code:SM-B-01Y) I have discussed the findings and recommendations with the patient. Results were also provided in writing at the conclusion of the visit. If  applicable, a reminder letter will be sent to the patient regarding the next appointment. BI-RADS CATEGORY  2: Benign. Electronically Signed   By: Pamelia Hoit M.D.   On: 11/26/2016 14:36       Assessment & Plan:   Problem List Items Addressed This Visit    GERD (gastroesophageal reflux disease)    Not an issue if she watches what she eats.  Follow.        Relevant Medications   Probiotic Product (PROBIOTIC PO)   Hematuria - Primary    Previous red blood cells in urine.  Treated for the infection.  Recheck urinalysis to confirm blood cleared.  She will bring urine by.        Relevant Orders   Urinalysis,  Routine w reflex microscopic       Einar Pheasant, MD

## 2017-03-02 ENCOUNTER — Encounter: Payer: Self-pay | Admitting: Internal Medicine

## 2017-03-02 DIAGNOSIS — R319 Hematuria, unspecified: Secondary | ICD-10-CM | POA: Insufficient documentation

## 2017-03-02 NOTE — Assessment & Plan Note (Signed)
Previous red blood cells in urine.  Treated for the infection.  Recheck urinalysis to confirm blood cleared.  She will bring urine by.

## 2017-03-02 NOTE — Assessment & Plan Note (Signed)
Not an issue if she watches what she eats.  Follow.

## 2017-03-05 ENCOUNTER — Other Ambulatory Visit (INDEPENDENT_AMBULATORY_CARE_PROVIDER_SITE_OTHER): Payer: BLUE CROSS/BLUE SHIELD

## 2017-03-05 DIAGNOSIS — R319 Hematuria, unspecified: Secondary | ICD-10-CM

## 2017-03-05 LAB — URINALYSIS, ROUTINE W REFLEX MICROSCOPIC
Bilirubin Urine: NEGATIVE
Hgb urine dipstick: NEGATIVE
KETONES UR: NEGATIVE
LEUKOCYTES UA: NEGATIVE
Nitrite: NEGATIVE
PH: 5.5 (ref 5.0–8.0)
SPECIFIC GRAVITY, URINE: 1.02 (ref 1.000–1.030)
Total Protein, Urine: NEGATIVE
URINE GLUCOSE: NEGATIVE
UROBILINOGEN UA: 0.2 (ref 0.0–1.0)

## 2017-03-07 ENCOUNTER — Encounter: Payer: Self-pay | Admitting: Internal Medicine

## 2017-08-03 ENCOUNTER — Encounter: Payer: Self-pay | Admitting: Internal Medicine

## 2017-08-05 NOTE — Telephone Encounter (Signed)
Patient and patients daughter have already talked to Dr. Nicki Reaper

## 2017-08-13 ENCOUNTER — Telehealth: Payer: Self-pay | Admitting: Radiology

## 2017-08-13 ENCOUNTER — Other Ambulatory Visit: Payer: Self-pay | Admitting: Internal Medicine

## 2017-08-13 DIAGNOSIS — R7309 Other abnormal glucose: Secondary | ICD-10-CM

## 2017-08-13 DIAGNOSIS — E785 Hyperlipidemia, unspecified: Secondary | ICD-10-CM

## 2017-08-13 NOTE — Telephone Encounter (Signed)
Pt coming in for labs Monday, please place future orders. Thank you 

## 2017-08-13 NOTE — Telephone Encounter (Signed)
Orders placed for labs

## 2017-08-13 NOTE — Progress Notes (Signed)
Orders placed for labs

## 2017-08-16 ENCOUNTER — Other Ambulatory Visit (INDEPENDENT_AMBULATORY_CARE_PROVIDER_SITE_OTHER): Payer: BLUE CROSS/BLUE SHIELD

## 2017-08-16 DIAGNOSIS — R7309 Other abnormal glucose: Secondary | ICD-10-CM | POA: Diagnosis not present

## 2017-08-16 DIAGNOSIS — E785 Hyperlipidemia, unspecified: Secondary | ICD-10-CM | POA: Diagnosis not present

## 2017-08-16 DIAGNOSIS — Z1211 Encounter for screening for malignant neoplasm of colon: Secondary | ICD-10-CM

## 2017-08-16 LAB — CBC WITH DIFFERENTIAL/PLATELET
Basophils Absolute: 0 10*3/uL (ref 0.0–0.1)
Basophils Relative: 1.2 % (ref 0.0–3.0)
EOS ABS: 0.1 10*3/uL (ref 0.0–0.7)
Eosinophils Relative: 1.3 % (ref 0.0–5.0)
HCT: 39.4 % (ref 36.0–46.0)
HEMOGLOBIN: 13.4 g/dL (ref 12.0–15.0)
Lymphocytes Relative: 37.8 % (ref 12.0–46.0)
Lymphs Abs: 1.5 10*3/uL (ref 0.7–4.0)
MCHC: 34 g/dL (ref 30.0–36.0)
MCV: 100.5 fl — ABNORMAL HIGH (ref 78.0–100.0)
MONO ABS: 0.4 10*3/uL (ref 0.1–1.0)
Monocytes Relative: 9.1 % (ref 3.0–12.0)
Neutro Abs: 2 10*3/uL (ref 1.4–7.7)
Neutrophils Relative %: 50.6 % (ref 43.0–77.0)
Platelets: 157 10*3/uL (ref 150.0–400.0)
RBC: 3.92 Mil/uL (ref 3.87–5.11)
RDW: 13.5 % (ref 11.5–15.5)
WBC: 4 10*3/uL (ref 4.0–10.5)

## 2017-08-16 LAB — COMPREHENSIVE METABOLIC PANEL
ALBUMIN: 4.1 g/dL (ref 3.5–5.2)
ALK PHOS: 63 U/L (ref 39–117)
ALT: 21 U/L (ref 0–35)
AST: 27 U/L (ref 0–37)
BILIRUBIN TOTAL: 0.4 mg/dL (ref 0.2–1.2)
BUN: 18 mg/dL (ref 6–23)
CO2: 28 mEq/L (ref 19–32)
Calcium: 9.3 mg/dL (ref 8.4–10.5)
Chloride: 106 mEq/L (ref 96–112)
Creatinine, Ser: 0.78 mg/dL (ref 0.40–1.20)
GFR: 80.4 mL/min (ref >60.00)
Glucose, Bld: 104 mg/dL — ABNORMAL HIGH (ref 70–99)
Potassium: 4.7 mEq/L (ref 3.5–5.1)
SODIUM: 141 meq/L (ref 135–145)
TOTAL PROTEIN: 6.7 g/dL (ref 6.0–8.3)

## 2017-08-16 LAB — TSH: TSH: 1.25 u[IU]/mL (ref 0.35–4.50)

## 2017-08-16 LAB — LIPID PANEL
CHOL/HDL RATIO: 2
Cholesterol: 207 mg/dL — ABNORMAL HIGH (ref 0–200)
HDL: 88.9 mg/dL (ref >39.00)
LDL Cholesterol: 110 mg/dL — ABNORMAL HIGH (ref 0–99)
NonHDL: 117.61
TRIGLYCERIDES: 37 mg/dL (ref 0.0–149.0)
VLDL: 7.4 mg/dL (ref 0.0–40.0)

## 2017-08-16 LAB — HEMOGLOBIN A1C: Hgb A1c MFr Bld: 5.7 % (ref 4.6–6.5)

## 2017-08-17 ENCOUNTER — Other Ambulatory Visit: Payer: Self-pay | Admitting: Internal Medicine

## 2017-08-17 ENCOUNTER — Other Ambulatory Visit (INDEPENDENT_AMBULATORY_CARE_PROVIDER_SITE_OTHER): Payer: BLUE CROSS/BLUE SHIELD

## 2017-08-17 DIAGNOSIS — D7589 Other specified diseases of blood and blood-forming organs: Secondary | ICD-10-CM

## 2017-08-17 LAB — VITAMIN B12: Vitamin B-12: 259 pg/mL (ref 211–911)

## 2017-08-17 NOTE — Progress Notes (Signed)
Order placed for add on lab.  °

## 2017-08-26 IMAGING — CR DG CHEST 2V
1 series · 2 of 2 positions shown · non-contrast
Comparison: 11/12/2008

CLINICAL DATA: Jaw and left arm pain.

EXAM:
CHEST  2 VIEW

[Series 1: dg chest 2 view · 0.14mm/px · 2 of 2 slices shown]
[im 1/2]
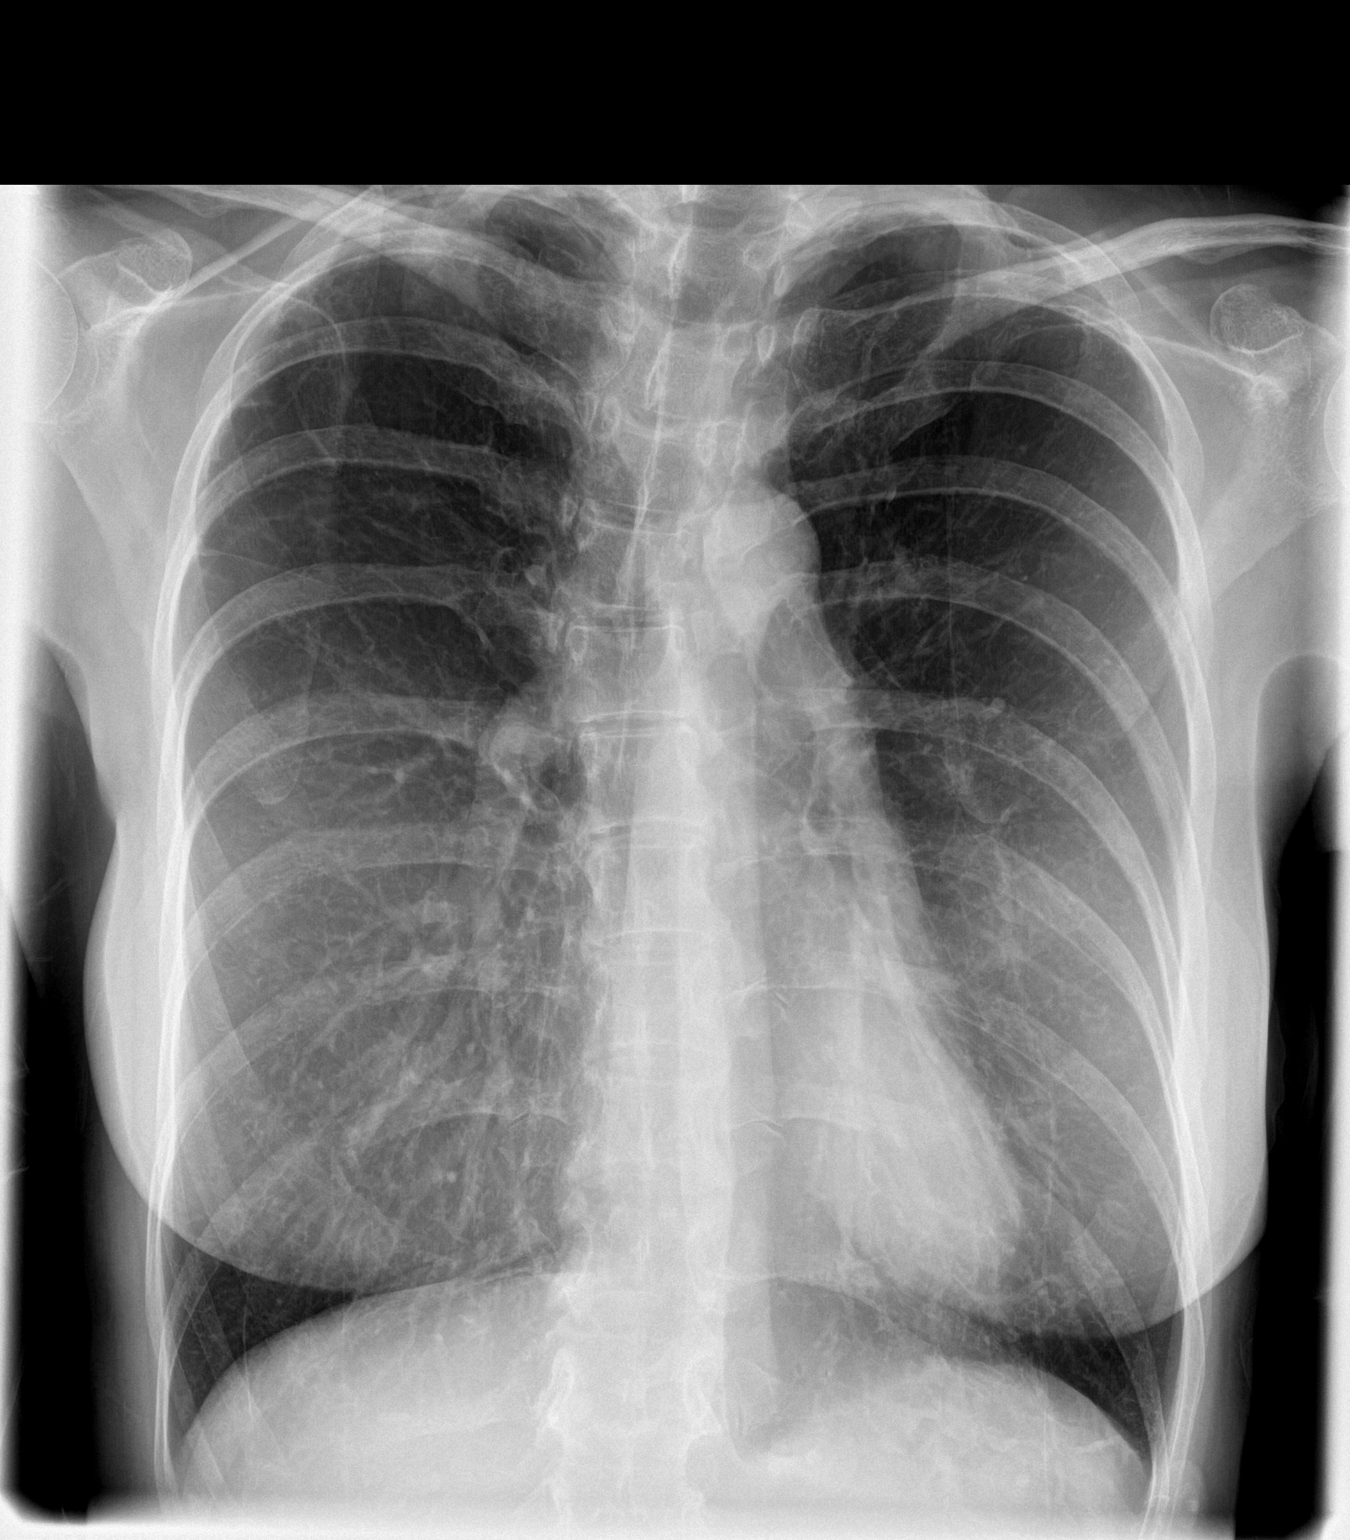
[im 2/2]
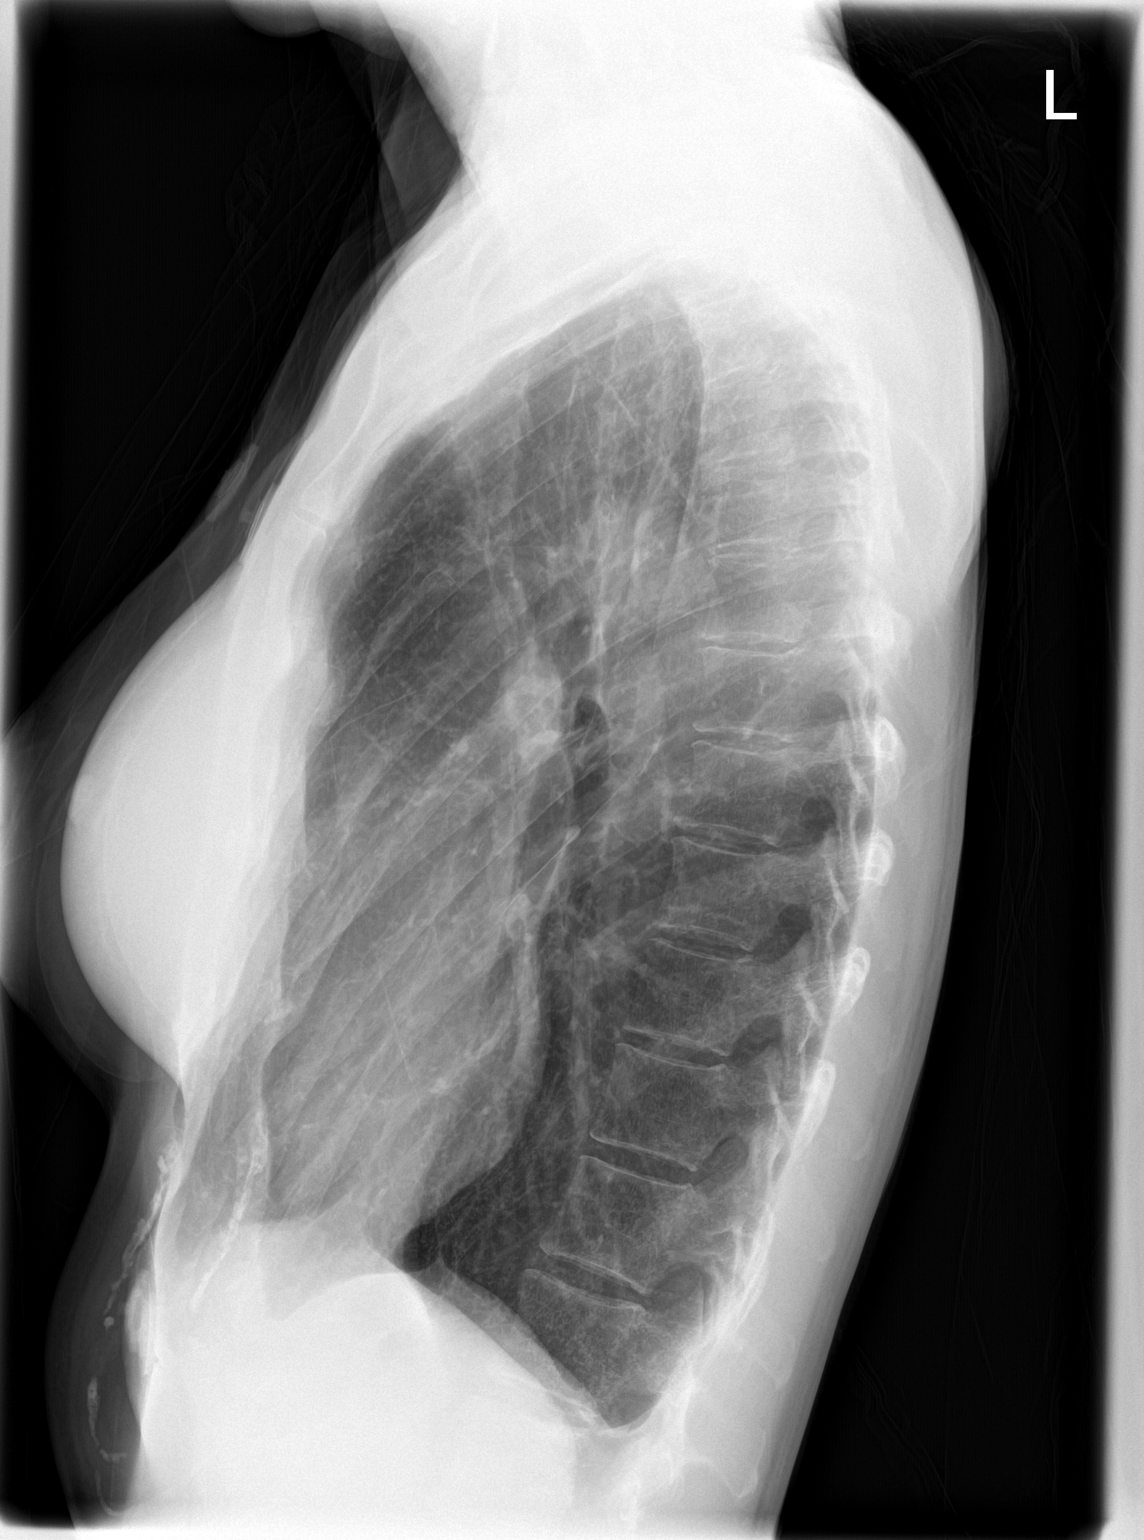

[2 of 2 positions shown; findings below may reference images not displayed]

FINDINGS: Chronic mild biapical scarring. Chronic generous lung volumes with
relative apical lucency but no diaphragm flattening or definite
emphysema. There is no edema, consolidation, effusion, or
pneumothorax. Normal heart size and aortic contours. Breast
implants. No acute osseous finding.
IMPRESSION: No evidence of acute cardiopulmonary disease.

## 2017-08-31 ENCOUNTER — Encounter: Payer: Self-pay | Admitting: Internal Medicine

## 2017-08-31 ENCOUNTER — Other Ambulatory Visit: Payer: Self-pay | Admitting: Internal Medicine

## 2017-08-31 ENCOUNTER — Ambulatory Visit (INDEPENDENT_AMBULATORY_CARE_PROVIDER_SITE_OTHER): Payer: BLUE CROSS/BLUE SHIELD | Admitting: Internal Medicine

## 2017-08-31 VITALS — BP 98/78 | HR 81 | Temp 97.6°F | Resp 16 | Wt 98.0 lb

## 2017-08-31 DIAGNOSIS — Z1231 Encounter for screening mammogram for malignant neoplasm of breast: Secondary | ICD-10-CM | POA: Diagnosis not present

## 2017-08-31 DIAGNOSIS — E538 Deficiency of other specified B group vitamins: Secondary | ICD-10-CM | POA: Diagnosis not present

## 2017-08-31 DIAGNOSIS — E785 Hyperlipidemia, unspecified: Secondary | ICD-10-CM

## 2017-08-31 DIAGNOSIS — Z1239 Encounter for other screening for malignant neoplasm of breast: Secondary | ICD-10-CM

## 2017-08-31 DIAGNOSIS — Z0000 Encounter for general adult medical examination without abnormal findings: Secondary | ICD-10-CM

## 2017-08-31 MED ORDER — CYANOCOBALAMIN 1000 MCG/ML IJ SOLN
1000.0000 ug | Freq: Once | INTRAMUSCULAR | Status: AC
  Administered 2017-08-31: 1000 ug via INTRAMUSCULAR

## 2017-08-31 NOTE — Progress Notes (Signed)
Patient ID: Leslie Davidson, female   DOB: [birth year], 57 y.o.   MRN: [id number]   Subjective:    Patient ID: Leslie Davidson, female    DOB: 1960/09/24, 57 y.o.   MRN: [id number]  HPI  Patient here for her physical exam.  She reports she has been doing relatively well.  Increased stress with her mother's health issues, but overall she feels she is handling things relatively well.  She is eating.  States she was initially trying to lose weight.  No nausea or vomiting.  No abdominal pain.  No acid reflux.  Bowels moving.  No urine change.  No chest pain or sob.     Past Medical History:  Diagnosis Date  . Arthritis   . GERD (gastroesophageal reflux disease)    Past Surgical History:  Procedure Laterality Date  . AUGMENTATION MAMMAPLASTY Bilateral    2000  . BREAST ENHANCEMENT SURGERY  2000   Family History  Problem Relation Age of Onset  . Diabetes Mother   . Cancer Father        Lung cancer  . Arthritis Maternal Grandmother   . Breast cancer Neg Hx    Social History   Socioeconomic History  . Marital status: Married    Spouse name: Not on file  . Number of children: Not on file  . Years of education: Not on file  . Highest education level: Not on file  Occupational History  . Not on file  Social Needs  . Financial resource strain: Not on file  . Food insecurity:    Worry: Not on file    Inability: Not on file  . Transportation needs:    Medical: Not on file    Non-medical: Not on file  Tobacco Use  . Smoking status: Former Smoker    Types: Cigarettes    Last attempt to quit: 03/31/1983    Years since quitting: 34.4  . Smokeless tobacco: Never Used  Substance and Sexual Activity  . Alcohol use: Yes    Alcohol/week: 0.0 oz    Comment: Occasional   . Drug use: No  . Sexual activity: Yes    Partners: Male    Birth control/protection: Post-menopausal    Comment: Husband  Lifestyle  . Physical activity:    Days per week: Not on file    Minutes per session: Not on file    . Stress: Not on file  Relationships  . Social connections:    Talks on phone: Not on file    Gets together: Not on file    Attends religious service: Not on file    Active member of club or organization: Not on file    Attends meetings of clubs or organizations: Not on file    Relationship status: Not on file  Other Topics Concern  . Not on file  Social History Narrative   Retired from the school system    Lives with husband    Children- 3- son and 2 daughters    Pets: 1 dog lives inside    Caffeine- Coffee 3-4 cups, 1 cup diet pepsi    Outpatient Encounter Medications as of 08/31/2017  Medication Sig  . Probiotic Product (PROBIOTIC PO) Take by mouth.  . [EXPIRED] cyanocobalamin ((VITAMIN B-12)) injection 1,000 mcg    No facility-administered encounter medications on file as of 08/31/2017.     Review of Systems  Constitutional: Negative for appetite change.       Has lost some weight.  Discussed with her today.  She is eating.  Was trying to lose some weight.    HENT: Negative for congestion and sinus pressure.   Eyes: Negative for pain and visual disturbance.  Respiratory: Negative for cough, chest tightness and shortness of breath.   Cardiovascular: Negative for chest pain, palpitations and leg swelling.  Gastrointestinal: Negative for abdominal pain, diarrhea, nausea and vomiting.  Genitourinary: Negative for difficulty urinating and dysuria.  Musculoskeletal: Negative for joint swelling and myalgias.  Skin: Negative for color change and rash.  Neurological: Negative for dizziness, light-headedness and headaches.  Hematological: Negative for adenopathy. Does not bruise/bleed easily.  Psychiatric/Behavioral: Negative for agitation and dysphoric mood.       Increased stress as outlined.         Objective:    Physical Exam  Constitutional: She is oriented to person, place, and time. She appears well-developed and well-nourished. No distress.  HENT:  Nose: Nose normal.   Mouth/Throat: Oropharynx is clear and moist.  Eyes: Right eye exhibits no discharge. Left eye exhibits no discharge. No scleral icterus.  Neck: Neck supple. No thyromegaly present.  Cardiovascular: Normal rate and regular rhythm.  Pulmonary/Chest: Breath sounds normal. No accessory muscle usage. No tachypnea. No respiratory distress. She has no decreased breath sounds. She has no wheezes. She has no rhonchi. Right breast exhibits no inverted nipple, no mass, no nipple discharge and no tenderness (no axillary adenopathy). Left breast exhibits no inverted nipple, no mass, no nipple discharge and no tenderness (no axilarry adenopathy).  Abdominal: Soft. Bowel sounds are normal. There is no tenderness.  Musculoskeletal: She exhibits no edema or tenderness.  Lymphadenopathy:    She has no cervical adenopathy.  Neurological: She is alert and oriented to person, place, and time.  Skin: No rash noted. No erythema.  Psychiatric: She has a normal mood and affect. Her behavior is normal.    BP 98/78 (BP Location: Left Arm, Patient Position: Sitting, Cuff Size: Normal)   Pulse 81   Temp 97.6 F (36.4 C) (Oral)   Resp 16   Wt 98 lb (44.5 kg)   LMP 01/02/2013   SpO2 97%   BMI 17.36 kg/m  Wt Readings from Last 3 Encounters:  08/31/17 98 lb (44.5 kg)  03/01/17 101 lb 8 oz (46 kg)  08/20/16 100 lb 6.4 oz (45.5 kg)     Lab Results  Component Value Date   WBC 4.0 08/16/2017   HGB 13.4 08/16/2017   HCT 39.4 08/16/2017   PLT 157.0 08/16/2017   GLUCOSE 104 (H) 08/16/2017   CHOL 207 (H) 08/16/2017   TRIG 37.0 08/16/2017   HDL 88.90 08/16/2017   LDLCALC 110 (H) 08/16/2017   ALT 21 08/16/2017   AST 27 08/16/2017   NA 141 08/16/2017   K 4.7 08/16/2017   CL 106 08/16/2017   CREATININE 0.78 08/16/2017   BUN 18 08/16/2017   CO2 28 08/16/2017   TSH 1.25 08/16/2017   HGBA1C 5.7 08/16/2017    Mm Diag Breast W/implant Tomo Bilateral  Result Date: 11/26/2016 CLINICAL DATA:  57 year old female  for left breast follow-up EXAM: 2D DIGITAL DIAGNOSTIC LEFT MAMMOGRAM WITH IMPLANTS, CAD AND ADJUNCT TOMO The patient has bilateral retropectoral saline implants. Standard and implant displaced views were performed. COMPARISON:  Previous exam(s). ACR Breast Density Category b: There are scattered areas of fibroglandular density. FINDINGS: The previously noted oval, circumscribed 7 mm mass within the upper, inner left breast is unchanged dating back to the CC view of the  patient's 2016 mammogram and MLO view of the patient's 2013 mammogram, consistent with a benign etiology. No suspicious mass, calcifications, or other abnormality is identified within either breast. Mammographic images were processed with CAD. IMPRESSION: No mammographic evidence of malignancy. RECOMMENDATION: Screening mammogram in one year.(Code:SM-B-01Y) I have discussed the findings and recommendations with the patient. Results were also provided in writing at the conclusion of the visit. If applicable, a reminder letter will be sent to the patient regarding the next appointment. BI-RADS CATEGORY  2: Benign. Electronically Signed   By: Pamelia Hoit M.D.   On: 11/26/2016 14:36       Assessment & Plan:   Problem List Items Addressed This Visit    Healthcare maintenance    Physical today 08/31/17.  Schedule mammogram.  Colonoscopy 2010.  Hemoccult cards given.        Hyperlipidemia    Follow lipid panel.         Other Visit Diagnoses    Routine general medical examination at a health care facility    -  Primary   Breast cancer screening       Relevant Orders   MM 3D SCREEN BREAST W/IMPLANT BILATERAL   B12 deficiency       Relevant Medications   cyanocobalamin ((VITAMIN B-12)) injection 1,000 mcg (Completed)       Einar Pheasant, MD

## 2017-09-01 ENCOUNTER — Ambulatory Visit: Payer: BLUE CROSS/BLUE SHIELD

## 2017-09-04 ENCOUNTER — Encounter: Payer: Self-pay | Admitting: Internal Medicine

## 2017-09-04 NOTE — Assessment & Plan Note (Signed)
Physical today 08/31/17.  Schedule mammogram.  Colonoscopy 2010.  Hemoccult cards given.

## 2017-09-04 NOTE — Assessment & Plan Note (Signed)
Follow lipid panel.   

## 2017-09-06 ENCOUNTER — Encounter: Payer: BLUE CROSS/BLUE SHIELD | Admitting: Internal Medicine

## 2017-09-07 DIAGNOSIS — L820 Inflamed seborrheic keratosis: Secondary | ICD-10-CM | POA: Diagnosis not present

## 2017-09-07 DIAGNOSIS — Z1283 Encounter for screening for malignant neoplasm of skin: Secondary | ICD-10-CM | POA: Diagnosis not present

## 2017-09-07 DIAGNOSIS — L821 Other seborrheic keratosis: Secondary | ICD-10-CM | POA: Diagnosis not present

## 2017-09-07 DIAGNOSIS — D1800 Hemangioma unspecified site: Secondary | ICD-10-CM | POA: Diagnosis not present

## 2017-09-08 ENCOUNTER — Ambulatory Visit (INDEPENDENT_AMBULATORY_CARE_PROVIDER_SITE_OTHER): Payer: BLUE CROSS/BLUE SHIELD

## 2017-09-08 DIAGNOSIS — E538 Deficiency of other specified B group vitamins: Secondary | ICD-10-CM | POA: Diagnosis not present

## 2017-09-08 MED ORDER — CYANOCOBALAMIN 1000 MCG/ML IJ SOLN
1000.0000 ug | Freq: Once | INTRAMUSCULAR | Status: AC
  Administered 2017-09-08: 1000 ug via INTRAMUSCULAR

## 2017-09-08 NOTE — Progress Notes (Addendum)
Patient presented for B 12 injection to Left deltoid, patient voiced no concerns nor showed any signs of distress during injection.  Reviewed.  Dr Scott 

## 2017-09-09 ENCOUNTER — Encounter: Payer: Self-pay | Admitting: Internal Medicine

## 2017-09-09 NOTE — Telephone Encounter (Signed)
Called and spoke with patient about her concerns that B 12 injection had been given in the same arm as previous injection. Assured patient that this would not effect the efficacy of the medication that switching sites in this case is more for patient comfort. Advised that because she did not feel the injection as previously did not mean she did not receive the medication .  Patient stated she was really more concerned she did not receive the injection since it did not hurt , that she was hoping the medication would stop her hair loss. Patient would like to know if she could add oral B 12 to see if this would increase levels faster so her loss will decrease? Patient laughing when phone call completed and stated she felt much better after our discussion.

## 2017-09-09 NOTE — Telephone Encounter (Signed)
Since receiving B12 injections, would hold on oral B12.  We will follow.  If persistent problems with hair loss, let us know.

## 2017-09-13 ENCOUNTER — Encounter: Payer: BLUE CROSS/BLUE SHIELD | Admitting: Internal Medicine

## 2017-09-15 ENCOUNTER — Ambulatory Visit (INDEPENDENT_AMBULATORY_CARE_PROVIDER_SITE_OTHER): Payer: BLUE CROSS/BLUE SHIELD | Admitting: *Deleted

## 2017-09-15 DIAGNOSIS — E538 Deficiency of other specified B group vitamins: Secondary | ICD-10-CM

## 2017-09-15 MED ORDER — CYANOCOBALAMIN 1000 MCG/ML IJ SOLN
1000.0000 ug | Freq: Once | INTRAMUSCULAR | Status: AC
  Administered 2017-09-15: 1000 ug via INTRAMUSCULAR

## 2017-09-15 NOTE — Progress Notes (Signed)
Patient presented for B 12 injection to right deltoid, patient voiced no concerns nor showed any signs of distress during injection. 

## 2017-09-19 NOTE — Progress Notes (Signed)
  I have reviewed the above information and agree with above.   Chaos Carlile, MD 

## 2017-09-22 ENCOUNTER — Ambulatory Visit (INDEPENDENT_AMBULATORY_CARE_PROVIDER_SITE_OTHER): Payer: BLUE CROSS/BLUE SHIELD | Admitting: *Deleted

## 2017-09-22 ENCOUNTER — Ambulatory Visit: Payer: BLUE CROSS/BLUE SHIELD

## 2017-09-22 DIAGNOSIS — E538 Deficiency of other specified B group vitamins: Secondary | ICD-10-CM

## 2017-09-22 MED ORDER — CYANOCOBALAMIN 1000 MCG/ML IJ SOLN
1000.0000 ug | Freq: Once | INTRAMUSCULAR | Status: AC
  Administered 2017-09-22: 1000 ug via INTRAMUSCULAR

## 2017-09-22 NOTE — Progress Notes (Signed)
Patient presented for B 12 injection to left deltoid, patient voiced no concerns nor showed any signs of distress during injection. 

## 2017-10-27 ENCOUNTER — Ambulatory Visit: Payer: BLUE CROSS/BLUE SHIELD

## 2017-11-10 ENCOUNTER — Ambulatory Visit (INDEPENDENT_AMBULATORY_CARE_PROVIDER_SITE_OTHER): Payer: BLUE CROSS/BLUE SHIELD

## 2017-11-10 DIAGNOSIS — E538 Deficiency of other specified B group vitamins: Secondary | ICD-10-CM

## 2017-11-10 MED ORDER — CYANOCOBALAMIN 1000 MCG/ML IJ SOLN
1000.0000 ug | Freq: Once | INTRAMUSCULAR | Status: AC
  Administered 2017-11-10: 1000 ug via INTRAMUSCULAR

## 2017-11-10 NOTE — Progress Notes (Addendum)
b 12 given in right deltoid, patient tolerated well.  Reviewed.  Dr Nicki Reaper

## 2017-11-30 ENCOUNTER — Ambulatory Visit
Admission: RE | Admit: 2017-11-30 | Discharge: 2017-11-30 | Disposition: A | Discharge status: Home / Self Care | Payer: BLUE CROSS/BLUE SHIELD | Source: Ambulatory Visit | Attending: Internal Medicine | Admitting: Internal Medicine

## 2017-11-30 DIAGNOSIS — Z1239 Encounter for other screening for malignant neoplasm of breast: Secondary | ICD-10-CM

## 2017-11-30 DIAGNOSIS — Z1231 Encounter for screening mammogram for malignant neoplasm of breast: Secondary | ICD-10-CM | POA: Diagnosis not present

## 2017-12-13 DIAGNOSIS — R300 Dysuria: Secondary | ICD-10-CM | POA: Diagnosis not present

## 2017-12-13 DIAGNOSIS — N390 Urinary tract infection, site not specified: Secondary | ICD-10-CM | POA: Diagnosis not present

## 2017-12-14 ENCOUNTER — Ambulatory Visit (INDEPENDENT_AMBULATORY_CARE_PROVIDER_SITE_OTHER): Payer: BLUE CROSS/BLUE SHIELD

## 2017-12-14 DIAGNOSIS — E538 Deficiency of other specified B group vitamins: Secondary | ICD-10-CM | POA: Diagnosis not present

## 2017-12-14 MED ORDER — CYANOCOBALAMIN 1000 MCG/ML IJ SOLN
1000.0000 ug | Freq: Once | INTRAMUSCULAR | Status: AC
  Administered 2017-12-14: 1000 ug via INTRAMUSCULAR

## 2017-12-14 NOTE — Progress Notes (Addendum)
Patient comes in for B 12 injection.  Injected left deltoid.  Patient tolerated injection well.   Reviewed.  Dr Scott 

## 2018-01-17 DIAGNOSIS — H5203 Hypermetropia, bilateral: Secondary | ICD-10-CM | POA: Diagnosis not present

## 2018-01-18 ENCOUNTER — Ambulatory Visit (INDEPENDENT_AMBULATORY_CARE_PROVIDER_SITE_OTHER): Payer: BLUE CROSS/BLUE SHIELD | Admitting: *Deleted

## 2018-01-18 DIAGNOSIS — E538 Deficiency of other specified B group vitamins: Secondary | ICD-10-CM

## 2018-01-18 DIAGNOSIS — Z23 Encounter for immunization: Secondary | ICD-10-CM

## 2018-01-18 MED ORDER — CYANOCOBALAMIN 1000 MCG/ML IJ SOLN
1000.0000 ug | Freq: Once | INTRAMUSCULAR | Status: AC
  Administered 2018-01-18: 1000 ug via INTRAMUSCULAR

## 2018-01-18 NOTE — Progress Notes (Signed)
Patient presented for B 12 injection to right deltoid, patient voiced no concerns nor showed any signs of distress during injection. 

## 2018-02-22 ENCOUNTER — Ambulatory Visit: Payer: BLUE CROSS/BLUE SHIELD

## 2018-03-03 ENCOUNTER — Ambulatory Visit (INDEPENDENT_AMBULATORY_CARE_PROVIDER_SITE_OTHER): Payer: BLUE CROSS/BLUE SHIELD

## 2018-03-03 DIAGNOSIS — E538 Deficiency of other specified B group vitamins: Secondary | ICD-10-CM

## 2018-03-03 MED ORDER — CYANOCOBALAMIN 1000 MCG/ML IJ SOLN
1000.0000 ug | Freq: Once | INTRAMUSCULAR | Status: AC
  Administered 2018-03-03: 1000 ug via INTRAMUSCULAR

## 2018-03-03 NOTE — Progress Notes (Addendum)
Pt was seen today for NV for b-12 shot given in LD. Pt tolerated well.   Reviewed.  Dr Nicki Reaper

## 2018-04-05 ENCOUNTER — Ambulatory Visit: Payer: BLUE CROSS/BLUE SHIELD

## 2018-04-07 ENCOUNTER — Ambulatory Visit (INDEPENDENT_AMBULATORY_CARE_PROVIDER_SITE_OTHER): Payer: BLUE CROSS/BLUE SHIELD

## 2018-04-07 DIAGNOSIS — E538 Deficiency of other specified B group vitamins: Secondary | ICD-10-CM

## 2018-04-07 MED ORDER — CYANOCOBALAMIN 1000 MCG/ML IJ SOLN
1000.0000 ug | Freq: Once | INTRAMUSCULAR | Status: AC
  Administered 2018-04-07: 1000 ug via INTRAMUSCULAR

## 2018-04-07 NOTE — Progress Notes (Signed)
Pt was seen today for B-12 shot given IM in the LD. Pt tolerated well.

## 2018-05-10 ENCOUNTER — Ambulatory Visit (INDEPENDENT_AMBULATORY_CARE_PROVIDER_SITE_OTHER): Payer: BLUE CROSS/BLUE SHIELD

## 2018-05-10 DIAGNOSIS — E538 Deficiency of other specified B group vitamins: Secondary | ICD-10-CM | POA: Diagnosis not present

## 2018-05-10 MED ORDER — CYANOCOBALAMIN 1000 MCG/ML IJ SOLN
1000.0000 ug | Freq: Once | INTRAMUSCULAR | Status: AC
  Administered 2018-05-10: 1000 ug via INTRAMUSCULAR

## 2018-05-10 NOTE — Progress Notes (Addendum)
Patient presents today for b12 injection. Right deltoid. Patient voiced no concerns nor showed any signs of distress during injection.   Reviewed.  Dr Nicki Reaper

## 2018-06-09 ENCOUNTER — Ambulatory Visit (INDEPENDENT_AMBULATORY_CARE_PROVIDER_SITE_OTHER): Payer: BLUE CROSS/BLUE SHIELD

## 2018-06-09 ENCOUNTER — Other Ambulatory Visit: Payer: Self-pay

## 2018-06-09 DIAGNOSIS — E538 Deficiency of other specified B group vitamins: Secondary | ICD-10-CM | POA: Diagnosis not present

## 2018-06-09 MED ORDER — CYANOCOBALAMIN 1000 MCG/ML IJ SOLN
1000.0000 ug | Freq: Once | INTRAMUSCULAR | Status: AC
  Administered 2018-06-09: 1000 ug via INTRAMUSCULAR

## 2018-06-09 NOTE — Progress Notes (Addendum)
Leslie Davidson presents today for injection per MD orders. B12 administered IM in left Upper Arm. Administration without incident. Patient tolerated well.  Nina,CMA  Reviewed Dr Nicki Reaper

## 2018-07-19 ENCOUNTER — Other Ambulatory Visit: Payer: Self-pay

## 2018-07-19 ENCOUNTER — Ambulatory Visit (INDEPENDENT_AMBULATORY_CARE_PROVIDER_SITE_OTHER): Payer: BLUE CROSS/BLUE SHIELD

## 2018-07-19 DIAGNOSIS — E538 Deficiency of other specified B group vitamins: Secondary | ICD-10-CM | POA: Diagnosis not present

## 2018-07-19 MED ORDER — CYANOCOBALAMIN 1000 MCG/ML IJ SOLN
1000.0000 ug | Freq: Once | INTRAMUSCULAR | Status: AC
  Administered 2018-07-19: 1000 ug via INTRAMUSCULAR

## 2018-07-19 NOTE — Progress Notes (Addendum)
Patient presented today for B12 injection per MD orders.  Administered IM in right deltoid.  Patient tolerated well. Butch Penny, CMA  Reviewed.  Dr Nicki Reaper

## 2018-08-18 ENCOUNTER — Other Ambulatory Visit: Payer: Self-pay

## 2018-08-18 ENCOUNTER — Ambulatory Visit (INDEPENDENT_AMBULATORY_CARE_PROVIDER_SITE_OTHER): Payer: BLUE CROSS/BLUE SHIELD

## 2018-08-18 DIAGNOSIS — E538 Deficiency of other specified B group vitamins: Secondary | ICD-10-CM

## 2018-08-18 MED ORDER — CYANOCOBALAMIN 1000 MCG/ML IJ SOLN
1000.0000 ug | Freq: Once | INTRAMUSCULAR | Status: AC
  Administered 2018-08-18: 1000 ug via INTRAMUSCULAR

## 2018-08-18 NOTE — Progress Notes (Addendum)
Leslie Davidson presents today for injection per MD orders. B12 injection  administered IM in left Upper Arm. Administration without incident. Patient tolerated well.  Bena Kobel,cma  Reviewed.

## 2018-09-06 ENCOUNTER — Ambulatory Visit (INDEPENDENT_AMBULATORY_CARE_PROVIDER_SITE_OTHER): Payer: BC Managed Care – PPO | Admitting: Internal Medicine

## 2018-09-06 ENCOUNTER — Other Ambulatory Visit (HOSPITAL_COMMUNITY)
Admission: RE | Admit: 2018-09-06 | Discharge: 2018-09-06 | Disposition: A | Discharge status: Home / Self Care | Payer: BC Managed Care – PPO | Source: Ambulatory Visit | Attending: Internal Medicine | Admitting: Internal Medicine

## 2018-09-06 ENCOUNTER — Other Ambulatory Visit: Payer: Self-pay

## 2018-09-06 ENCOUNTER — Encounter: Payer: Self-pay | Admitting: Internal Medicine

## 2018-09-06 VITALS — BP 98/66 | HR 67 | Temp 97.5°F | Resp 16 | Wt 100.0 lb

## 2018-09-06 DIAGNOSIS — E785 Hyperlipidemia, unspecified: Secondary | ICD-10-CM | POA: Diagnosis not present

## 2018-09-06 DIAGNOSIS — E538 Deficiency of other specified B group vitamins: Secondary | ICD-10-CM

## 2018-09-06 DIAGNOSIS — Z0000 Encounter for general adult medical examination without abnormal findings: Secondary | ICD-10-CM | POA: Diagnosis not present

## 2018-09-06 DIAGNOSIS — R0602 Shortness of breath: Secondary | ICD-10-CM | POA: Diagnosis not present

## 2018-09-06 DIAGNOSIS — Z124 Encounter for screening for malignant neoplasm of cervix: Secondary | ICD-10-CM

## 2018-09-06 DIAGNOSIS — K219 Gastro-esophageal reflux disease without esophagitis: Secondary | ICD-10-CM

## 2018-09-06 DIAGNOSIS — R7309 Other abnormal glucose: Secondary | ICD-10-CM

## 2018-09-06 DIAGNOSIS — Z1211 Encounter for screening for malignant neoplasm of colon: Secondary | ICD-10-CM

## 2018-09-06 LAB — CBC WITH DIFFERENTIAL/PLATELET
Basophils Absolute: 0.1 10*3/uL (ref 0.0–0.1)
Basophils Relative: 1.2 % (ref 0.0–3.0)
Eosinophils Absolute: 0.1 10*3/uL (ref 0.0–0.7)
Eosinophils Relative: 1.7 % (ref 0.0–5.0)
HCT: 41.5 % (ref 36.0–46.0)
Hemoglobin: 13.6 g/dL (ref 12.0–15.0)
Lymphocytes Relative: 37.5 % (ref 12.0–46.0)
Lymphs Abs: 1.9 10*3/uL (ref 0.7–4.0)
MCHC: 32.8 g/dL (ref 30.0–36.0)
MCV: 103.2 fl — ABNORMAL HIGH (ref 78.0–100.0)
Monocytes Absolute: 0.3 10*3/uL (ref 0.1–1.0)
Monocytes Relative: 6.3 % (ref 3.0–12.0)
Neutro Abs: 2.8 10*3/uL (ref 1.4–7.7)
Neutrophils Relative %: 53.3 % (ref 43.0–77.0)
Platelets: 139 10*3/uL — ABNORMAL LOW (ref 150.0–400.0)
RBC: 4.02 Mil/uL (ref 3.87–5.11)
RDW: 13.2 % (ref 11.5–15.5)
WBC: 5.2 10*3/uL (ref 4.0–10.5)

## 2018-09-06 LAB — LIPID PANEL
Cholesterol: 229 mg/dL — ABNORMAL HIGH (ref 0–200)
HDL: 98.5 mg/dL (ref >39.00)
LDL Cholesterol: 116 mg/dL — ABNORMAL HIGH (ref 0–99)
NonHDL: 130.28
Total CHOL/HDL Ratio: 2
Triglycerides: 72 mg/dL (ref 0.0–149.0)
VLDL: 14.4 mg/dL (ref 0.0–40.0)

## 2018-09-06 LAB — COMPREHENSIVE METABOLIC PANEL
ALT: 29 U/L (ref 0–35)
AST: 31 U/L (ref 0–37)
Albumin: 4.5 g/dL (ref 3.5–5.2)
Alkaline Phosphatase: 66 U/L (ref 39–117)
BUN: 18 mg/dL (ref 6–23)
CO2: 28 mEq/L (ref 19–32)
Calcium: 9.5 mg/dL (ref 8.4–10.5)
Chloride: 103 mEq/L (ref 96–112)
Creatinine, Ser: 0.78 mg/dL (ref 0.40–1.20)
GFR: 75.37 mL/min (ref >60.00)
Glucose, Bld: 86 mg/dL (ref 70–99)
Potassium: 4.2 mEq/L (ref 3.5–5.1)
Sodium: 139 mEq/L (ref 135–145)
Total Bilirubin: 0.6 mg/dL (ref 0.2–1.2)
Total Protein: 6.8 g/dL (ref 6.0–8.3)

## 2018-09-06 LAB — HEMOGLOBIN A1C: Hgb A1c MFr Bld: 5.9 % (ref 4.6–6.5)

## 2018-09-06 LAB — VITAMIN B12: Vitamin B-12: 412 pg/mL (ref 211–911)

## 2018-09-06 LAB — TSH: TSH: 1.29 u[IU]/mL (ref 0.35–4.50)

## 2018-09-06 MED ORDER — PANTOPRAZOLE SODIUM 20 MG PO TBEC: 20.0000 mg | DELAYED_RELEASE_TABLET | Freq: Every day | ORAL | 2 refills | Status: DC

## 2018-09-06 NOTE — Progress Notes (Addendum)
Patient ID: Leslie Davidson, female   DOB: 1961-01-09, 58 y.o.   MRN: [id number]   Subjective:    Patient ID: Leslie Davidson, female    DOB: Sep 15, 1960, 58 y.o.   MRN: [id number]  HPI  Patient here for her physical exam. Increased stress and anxiety recently.  Discussed further evaluation and treatment including medication.  She declines at this time.  Overall she feels she is handling things relatively well.  No chest pain.  Trying to stay active.  No acid reflux.  No abdominal pain.  Bowels moving.  Receiving b12 injections.  Weight stable.  Noticed occasional sob.  No sob with exertion.  No chest pain or tightness with exertion.  SOB not a significant issue.     Past Medical History:  Diagnosis Date  . Arthritis   . GERD (gastroesophageal reflux disease)    Past Surgical History:  Procedure Laterality Date  . AUGMENTATION MAMMAPLASTY Bilateral    2000  . BREAST ENHANCEMENT SURGERY  2000   Family History  Problem Relation Age of Onset  . Diabetes Mother   . Cancer Father        Lung cancer  . Arthritis Maternal Grandmother   . Breast cancer Neg Hx    Social History   Socioeconomic History  . Marital status: Married    Spouse name: Not on file  . Number of children: Not on file  . Years of education: Not on file  . Highest education level: Not on file  Occupational History  . Not on file  Social Needs  . Financial resource strain: Not on file  . Food insecurity    Worry: Not on file    Inability: Not on file  . Transportation needs    Medical: Not on file    Non-medical: Not on file  Tobacco Use  . Smoking status: Former Smoker    Types: Cigarettes    Quit date: 03/31/1983    Years since quitting: 35.4  . Smokeless tobacco: Never Used  Substance and Sexual Activity  . Alcohol use: Yes    Alcohol/week: 0.0 standard drinks    Comment: Occasional   . Drug use: No  . Sexual activity: Yes    Partners: Male    Birth control/protection: Post-menopausal    Comment: Husband   Lifestyle  . Physical activity    Days per week: Not on file    Minutes per session: Not on file  . Stress: Not on file  Relationships  . Social Herbalist on phone: Not on file    Gets together: Not on file    Attends religious service: Not on file    Active member of club or organization: Not on file    Attends meetings of clubs or organizations: Not on file    Relationship status: Not on file  Other Topics Concern  . Not on file  Social History Narrative   Retired from the school system    Lives with husband    Children- 3- son and 2 daughters    Pets: 1 dog lives inside    Caffeine- Coffee 3-4 cups, 1 cup diet pepsi    Outpatient Encounter Medications as of 09/06/2018  Medication Sig  . Probiotic Product (PROBIOTIC PO) Take by mouth.  . pantoprazole (PROTONIX) 20 MG tablet Take 1 tablet (20 mg total) by mouth daily.   No facility-administered encounter medications on file as of 09/06/2018.     Review of Systems  Constitutional: Negative for appetite change and unexpected weight change.  HENT: Negative for congestion and sinus pressure.   Eyes: Negative for pain and visual disturbance.  Respiratory: Negative for cough, chest tightness and shortness of breath.   Cardiovascular: Negative for chest pain, palpitations and leg swelling.  Gastrointestinal: Negative for abdominal pain, diarrhea, nausea and vomiting.  Genitourinary: Negative for difficulty urinating and dysuria.  Musculoskeletal: Negative for joint swelling.  Skin: Negative for color change and rash.  Neurological: Negative for dizziness, light-headedness and headaches.  Hematological: Negative for adenopathy. Does not bruise/bleed easily.  Psychiatric/Behavioral: Negative for agitation and dysphoric mood.       Objective:    Physical Exam Constitutional:      Appearance: She is well-developed.  HENT:     Nose: Nose normal.  Eyes:     General: No scleral icterus.       Right eye: No  discharge.        Left eye: No discharge.  Neck:     Musculoskeletal: Neck supple.     Thyroid: No thyromegaly.  Cardiovascular:     Rate and Rhythm: Normal rate and regular rhythm.  Pulmonary:     Effort: No tachypnea, accessory muscle usage or respiratory distress.     Breath sounds: Normal breath sounds. No decreased breath sounds, wheezing or rhonchi.  Chest:     Breasts:        Right: No inverted nipple, mass, nipple discharge or tenderness (no axillary adenopathy).        Left: No inverted nipple, mass, nipple discharge or tenderness (no axilarry adenopathy).  Abdominal:     General: Bowel sounds are normal.     Palpations: Abdomen is soft.     Tenderness: There is no abdominal tenderness.  Genitourinary:    Comments: Normal external genitalia.  Vaginal vault without lesions.  Cervix identified.  Pap smear performed.  Could not appreciate any adnexal masses or tenderness.   Musculoskeletal:        General: No tenderness.  Lymphadenopathy:     Cervical: No cervical adenopathy.  Skin:    Findings: No erythema or rash.  Neurological:     Mental Status: She is alert and oriented to person, place, and time.  Psychiatric:        Mood and Affect: Mood normal.        Behavior: Behavior normal.     BP 98/66   Pulse 67   Temp (!) 97.5 F (36.4 C) (Oral)   Resp 16   Wt 100 lb (45.4 kg)   LMP 01/02/2013   SpO2 99%   BMI 17.71 kg/m  Wt Readings from Last 3 Encounters:  09/06/18 100 lb (45.4 kg)  08/31/17 98 lb (44.5 kg)  03/01/17 101 lb 8 oz (46 kg)     Lab Results  Component Value Date   WBC 5.2 09/06/2018   HGB 13.6 09/06/2018   HCT 41.5 09/06/2018   PLT 139.0 (L) 09/06/2018   GLUCOSE 86 09/06/2018   CHOL 229 (H) 09/06/2018   TRIG 72.0 09/06/2018   HDL 98.50 09/06/2018   LDLCALC 116 (H) 09/06/2018   ALT 29 09/06/2018   AST 31 09/06/2018   NA 139 09/06/2018   K 4.2 09/06/2018   CL 103 09/06/2018   CREATININE 0.78 09/06/2018   BUN 18 09/06/2018   CO2 28  09/06/2018   TSH 1.29 09/06/2018   HGBA1C 5.9 09/06/2018    Mm 3d Screen Breast W/implant Bilateral  Result Date: 11/30/2017  CLINICAL DATA:  Screening. EXAM: DIGITAL SCREENING BILATERAL MAMMOGRAM WITH IMPLANTS, CAD AND TOMO The patient has retropectoral implants. Standard and implant displaced views were performed. COMPARISON:  Previous exam(s). ACR Breast Density Category c: The breast tissue is heterogeneously dense, which may obscure small masses. FINDINGS: The patient has retropectoral saline implants. Standard 2D full field CC and MLO views of both breasts and standard and tomosynthesis implant displaced CC and MLO views of both breasts were obtained. There are no findings suspicious for malignancy. Images were processed with CAD. IMPRESSION: No mammographic evidence of malignancy. A result letter of this screening mammogram will be mailed directly to the patient. RECOMMENDATION: Screening mammogram in one year. (Code:SM-B-01Y) BI-RADS CATEGORY  1:  Negative. Electronically Signed   By: Evangeline Dakin M.D.   On: 11/30/2017 16:32       Assessment & Plan:   Problem List Items Addressed This Visit    Elevated glucose level   Relevant Orders   Comprehensive metabolic panel (Completed)   Hemoglobin A1c (Completed)   GERD (gastroesophageal reflux disease)   Relevant Medications   pantoprazole (PROTONIX) 20 MG tablet   Healthcare maintenance    Physical today 09/06/18.  PAP 09/06/18.  Mammogram 11/30/17 - [facility] I.  Colonoscopy 2010.  Refer to GI for f/u colonoscopy.        Hyperlipidemia   Relevant Orders   CBC with Differential/Platelet (Completed)   TSH (Completed)   Lipid panel (Completed)   SOB (shortness of breath)    Occasionally notices some sob.  Not a significant issue.  No sob with exertion.  EKG - SR with no acute ischemic changes.  Discussed further w/up.  Will follow.  Notify me if symptoms change, worsen or continue. Desires no further w/up at this time.          Other  Visit Diagnoses    Shortness of breath    -  Primary   Relevant Orders   EKG 12-Lead (Completed)   B12 deficiency       Relevant Orders   Vitamin B12 (Completed)   Colon cancer screening       Relevant Orders   Ambulatory referral to Gastroenterology   Cervical cancer screening       Relevant Orders   Cytology - PAP( [facility]) (Completed)       Einar Pheasant, MD

## 2018-09-06 NOTE — Assessment & Plan Note (Addendum)
Physical today 09/06/18.  PAP 09/06/18.  Mammogram 11/30/17 - Birads I.  Colonoscopy 2010.  Refer to GI for f/u colonoscopy.

## 2018-09-07 ENCOUNTER — Other Ambulatory Visit: Payer: Self-pay | Admitting: Internal Medicine

## 2018-09-07 DIAGNOSIS — D696 Thrombocytopenia, unspecified: Secondary | ICD-10-CM

## 2018-09-07 DIAGNOSIS — D7589 Other specified diseases of blood and blood-forming organs: Secondary | ICD-10-CM

## 2018-09-07 NOTE — Progress Notes (Signed)
Order placed for f/u labs.  

## 2018-09-08 ENCOUNTER — Encounter: Payer: Self-pay | Admitting: Internal Medicine

## 2018-09-08 LAB — CYTOLOGY - PAP
Diagnosis: NEGATIVE
HPV: NOT DETECTED

## 2018-09-11 ENCOUNTER — Encounter: Payer: Self-pay | Admitting: Internal Medicine

## 2018-09-11 DIAGNOSIS — R0602 Shortness of breath: Secondary | ICD-10-CM | POA: Insufficient documentation

## 2018-09-11 NOTE — Assessment & Plan Note (Signed)
Occasionally notices some sob.  Not a significant issue.  No sob with exertion.  EKG - SR with no acute ischemic changes.  Discussed further w/up.  Will follow.  Notify me if symptoms change, worsen or continue. Desires no further w/up at this time.

## 2018-09-21 ENCOUNTER — Ambulatory Visit (INDEPENDENT_AMBULATORY_CARE_PROVIDER_SITE_OTHER): Payer: BC Managed Care – PPO | Admitting: *Deleted

## 2018-09-21 ENCOUNTER — Other Ambulatory Visit: Payer: Self-pay

## 2018-09-21 DIAGNOSIS — E538 Deficiency of other specified B group vitamins: Secondary | ICD-10-CM | POA: Diagnosis not present

## 2018-09-22 DIAGNOSIS — E538 Deficiency of other specified B group vitamins: Secondary | ICD-10-CM | POA: Insufficient documentation

## 2018-09-22 MED ORDER — CYANOCOBALAMIN 1000 MCG/ML IJ SOLN
1000.0000 ug | Freq: Once | INTRAMUSCULAR | Status: AC
  Administered 2018-09-21: 1000 ug via INTRAMUSCULAR

## 2018-09-22 NOTE — Progress Notes (Addendum)
Patient presented for B 12 injection to right deltoid, patient voiced no concerns nor showed any signs of distress during injection.  Reviewed.  Dr Scott 

## 2018-10-25 ENCOUNTER — Ambulatory Visit: Payer: BC Managed Care – PPO

## 2018-10-26 ENCOUNTER — Other Ambulatory Visit: Payer: Self-pay

## 2018-10-26 ENCOUNTER — Ambulatory Visit (INDEPENDENT_AMBULATORY_CARE_PROVIDER_SITE_OTHER): Payer: BC Managed Care – PPO

## 2018-10-26 DIAGNOSIS — E538 Deficiency of other specified B group vitamins: Secondary | ICD-10-CM

## 2018-10-26 MED ORDER — CYANOCOBALAMIN 1000 MCG/ML IJ SOLN
1000.0000 ug | Freq: Once | INTRAMUSCULAR | Status: AC
  Administered 2018-10-26: 1000 ug via INTRAMUSCULAR

## 2018-10-26 NOTE — Progress Notes (Addendum)
Patient presented for B12 injection today.  Administered IM in left deltoid.  Patient tolerated well with no signs of distress.  Reviewed.  Dr Nicki Reaper

## 2018-11-11 ENCOUNTER — Other Ambulatory Visit: Payer: Self-pay

## 2018-11-11 ENCOUNTER — Encounter: Payer: Self-pay | Admitting: Internal Medicine

## 2018-11-11 ENCOUNTER — Ambulatory Visit (INDEPENDENT_AMBULATORY_CARE_PROVIDER_SITE_OTHER): Payer: BC Managed Care – PPO | Admitting: Internal Medicine

## 2018-11-11 VITALS — Wt 100.0 lb

## 2018-11-11 DIAGNOSIS — E785 Hyperlipidemia, unspecified: Secondary | ICD-10-CM | POA: Diagnosis not present

## 2018-11-11 DIAGNOSIS — E538 Deficiency of other specified B group vitamins: Secondary | ICD-10-CM | POA: Diagnosis not present

## 2018-11-11 DIAGNOSIS — K219 Gastro-esophageal reflux disease without esophagitis: Secondary | ICD-10-CM | POA: Diagnosis not present

## 2018-11-11 DIAGNOSIS — Z1239 Encounter for other screening for malignant neoplasm of breast: Secondary | ICD-10-CM | POA: Diagnosis not present

## 2018-11-11 NOTE — Progress Notes (Signed)
Patient ID: Leslie Davidson, female   DOB: [birth year], 58 y.o.   MRN: [id number]   Virtual Visit via telephone Note  This visit type was conducted due to national recommendations for restrictions regarding the COVID-19 pandemic (e.g. social distancing).  This format is felt to be most appropriate for this patient at this time.  All issues noted in this document were discussed and addressed.  No physical exam was performed (except for noted visual exam findings with Video Visits).   I connected with Leslie Davidson by telephone and verified that I am speaking with the correct person using two identifiers. Location patient: home Location provider: work  Persons participating in the telephone visit: patient, provider  I discussed the limitations, risks, security and privacy concerns of performing an evaluation and management service by telephone and the availability of in person appointments.  The patient expressed understanding and agreed to proceed.   Reason for visit: scheduled follow up.    HPI: She reports she is doing well.  Feels good.  Stays active.  No chest pain.  No sob.  No acid reflux.  No abdominal pain.  Bowels moving.  Has adjusted her diet.  No acid reflux with diet adjustment.  Eating yogurt.  Decreased coffee intake.  Discussed the need to mammogram.     ROS: See pertinent positives and negatives per HPI.  Past Medical History:  Diagnosis Date  . Arthritis   . GERD (gastroesophageal reflux disease)     Past Surgical History:  Procedure Laterality Date  . AUGMENTATION MAMMAPLASTY Bilateral    2000  . BREAST ENHANCEMENT SURGERY  2000    Family History  Problem Relation Age of Onset  . Diabetes Mother   . Cancer Father        Lung cancer  . Arthritis Maternal Grandmother   . Breast cancer Neg Hx     SOCIAL HX: reviewed.    Current Outpatient Medications:  .  Probiotic Product (PROBIOTIC PO), Take by mouth., Disp: , Rfl:   EXAM:  GENERAL: alert. Sounds to be in  no acute distress. Answering questions appropriately.    PSYCH/NEURO: pleasant and cooperative, no obvious depression or anxiety, speech and thought processing grossly intact  ASSESSMENT AND PLAN:  Discussed the following assessment and plan:  B12 deficiency Continue B12 injections.    Hyperlipidemia Follow lipid panel.    GERD (gastroesophageal reflux disease) No acid reflux now.  Has adjusted her diet.  This is controlling her symptoms.  Follow.      I discussed the assessment and treatment plan with the patient. The patient was provided an opportunity to ask questions and all were answered. The patient agreed with the plan and demonstrated an understanding of the instructions.   The patient was advised to call back or seek an in-person evaluation if the symptoms worsen or if the condition fails to improve as anticipated.  I provided 15 minutes of non-face-to-face time during this encounter.   Einar Pheasant, MD

## 2018-11-13 ENCOUNTER — Encounter: Payer: Self-pay | Admitting: Internal Medicine

## 2018-11-13 NOTE — Assessment & Plan Note (Signed)
Continue B12 injections.   

## 2018-11-13 NOTE — Assessment & Plan Note (Signed)
Follow lipid panel.   

## 2018-11-13 NOTE — Assessment & Plan Note (Signed)
No acid reflux now.  Has adjusted her diet.  This is controlling her symptoms.  Follow.

## 2018-11-28 ENCOUNTER — Telehealth: Payer: Self-pay

## 2018-11-28 MED ORDER — SCOPOLAMINE 1 MG/3DAYS TD PT72: 1.0000 | MEDICATED_PATCH | TRANSDERMAL | 0 refills | Status: DC

## 2018-11-28 NOTE — Telephone Encounter (Signed)
rx sent in for scopolamine patches.  

## 2018-11-28 NOTE — Addendum Note (Signed)
Addended by: Alisa Graff on: 11/28/2018 11:12 PM   Modules accepted: Orders

## 2018-11-28 NOTE — Telephone Encounter (Signed)
Patient would like it sent to [facility] in [place]

## 2018-11-28 NOTE — Telephone Encounter (Signed)
Does she want me to send in the prescription or does she want to pick up tomorrow when here for B12 injection.

## 2018-11-28 NOTE — Telephone Encounter (Signed)
Copied from [facility] [phone]. Topic: General - Other >> Nov 28, 2018  2:22 PM Keene Breath wrote: Reason for CRM: Patient called to request a sea sickness patch because she is going on a trip and believes she will get sick.  Patient has an appt. Tomorrow and would like to get it tomorrow.  Please advise and call back if there are any questions.  CB# [phone]

## 2018-11-29 ENCOUNTER — Other Ambulatory Visit: Payer: Self-pay

## 2018-11-29 ENCOUNTER — Ambulatory Visit (INDEPENDENT_AMBULATORY_CARE_PROVIDER_SITE_OTHER): Payer: BC Managed Care – PPO

## 2018-11-29 DIAGNOSIS — E538 Deficiency of other specified B group vitamins: Secondary | ICD-10-CM

## 2018-11-29 MED ORDER — CYANOCOBALAMIN 1000 MCG/ML IJ SOLN
1000.0000 ug | Freq: Once | INTRAMUSCULAR | Status: AC
  Administered 2018-11-29: 1000 ug via INTRAMUSCULAR

## 2018-11-29 NOTE — Progress Notes (Addendum)
Patient presented today for B12 injection.  Administered IM in right deltoid.  Patient tolerated well with no signs of distress.  Reviewed.  Dr Scott    

## 2018-12-09 ENCOUNTER — Encounter: Payer: Self-pay | Admitting: Internal Medicine

## 2019-01-02 ENCOUNTER — Ambulatory Visit: Payer: BC Managed Care – PPO

## 2019-01-03 ENCOUNTER — Other Ambulatory Visit: Payer: Self-pay

## 2019-01-03 ENCOUNTER — Ambulatory Visit (INDEPENDENT_AMBULATORY_CARE_PROVIDER_SITE_OTHER): Payer: BC Managed Care – PPO

## 2019-01-03 DIAGNOSIS — E538 Deficiency of other specified B group vitamins: Secondary | ICD-10-CM | POA: Diagnosis not present

## 2019-01-03 MED ORDER — CYANOCOBALAMIN 1000 MCG/ML IJ SOLN
1000.0000 ug | Freq: Once | INTRAMUSCULAR | Status: AC
  Administered 2019-01-03: 1000 ug via INTRAMUSCULAR

## 2019-01-03 NOTE — Progress Notes (Addendum)
Leslie Davidson presents today for injection per MD orders. B12 injection  administered IM in left Upper Arm. Administration without incident. Patient tolerated well.  Danielle Mink,cma  Reviewed.  Dr Nicki Reaper

## 2019-01-30 DIAGNOSIS — Z1211 Encounter for screening for malignant neoplasm of colon: Secondary | ICD-10-CM | POA: Diagnosis not present

## 2019-01-30 DIAGNOSIS — Z01818 Encounter for other preprocedural examination: Secondary | ICD-10-CM | POA: Diagnosis not present

## 2019-02-07 ENCOUNTER — Ambulatory Visit (INDEPENDENT_AMBULATORY_CARE_PROVIDER_SITE_OTHER): Payer: BC Managed Care – PPO

## 2019-02-07 ENCOUNTER — Other Ambulatory Visit: Payer: Self-pay

## 2019-02-07 DIAGNOSIS — E538 Deficiency of other specified B group vitamins: Secondary | ICD-10-CM

## 2019-02-07 DIAGNOSIS — Z23 Encounter for immunization: Secondary | ICD-10-CM

## 2019-02-07 MED ORDER — CYANOCOBALAMIN 1000 MCG/ML IJ SOLN
1000.0000 ug | Freq: Once | INTRAMUSCULAR | Status: AC
  Administered 2019-02-07: 1000 ug via INTRAMUSCULAR

## 2019-02-07 NOTE — Progress Notes (Addendum)
Patient presented today for B12 injection.  Administered IM in the right deltoid.  Patient tolerated well with no signs of distress.  Reviewed.  Dr Scott  

## 2019-03-14 ENCOUNTER — Other Ambulatory Visit: Payer: Self-pay

## 2019-03-14 ENCOUNTER — Ambulatory Visit (INDEPENDENT_AMBULATORY_CARE_PROVIDER_SITE_OTHER): Payer: BC Managed Care – PPO

## 2019-03-14 DIAGNOSIS — E538 Deficiency of other specified B group vitamins: Secondary | ICD-10-CM

## 2019-03-14 MED ORDER — CYANOCOBALAMIN 1000 MCG/ML IJ SOLN
1000.0000 ug | Freq: Once | INTRAMUSCULAR | Status: AC
  Administered 2019-03-14: 10:00:00 1000 ug via INTRAMUSCULAR

## 2019-03-14 NOTE — Progress Notes (Addendum)
Patient came in today for B-12 injection in right deltoid. Patient tolerated well.   Reviewed.  Dr Nicki Reaper

## 2019-04-18 ENCOUNTER — Ambulatory Visit (INDEPENDENT_AMBULATORY_CARE_PROVIDER_SITE_OTHER): Payer: BC Managed Care – PPO

## 2019-04-18 ENCOUNTER — Other Ambulatory Visit: Payer: Self-pay

## 2019-04-18 VITALS — Temp 96.6°F

## 2019-04-18 DIAGNOSIS — E538 Deficiency of other specified B group vitamins: Secondary | ICD-10-CM | POA: Diagnosis not present

## 2019-04-18 MED ORDER — CYANOCOBALAMIN 1000 MCG/ML IJ SOLN
1000.0000 ug | Freq: Once | INTRAMUSCULAR | Status: AC
  Administered 2019-04-18: 1000 ug via INTRAMUSCULAR

## 2019-04-18 NOTE — Progress Notes (Addendum)
Patient came in today for B-12 injection in left deltoid, IM. Patient tolerated well.   Reviewed.  Dr Nicki Reaper

## 2019-05-23 ENCOUNTER — Ambulatory Visit: Payer: BC Managed Care – PPO

## 2019-05-24 DIAGNOSIS — Z01818 Encounter for other preprocedural examination: Secondary | ICD-10-CM | POA: Diagnosis not present

## 2019-05-29 DIAGNOSIS — Z1211 Encounter for screening for malignant neoplasm of colon: Secondary | ICD-10-CM | POA: Diagnosis not present

## 2019-05-29 LAB — HM COLONOSCOPY

## 2019-06-07 ENCOUNTER — Ambulatory Visit (INDEPENDENT_AMBULATORY_CARE_PROVIDER_SITE_OTHER): Payer: BC Managed Care – PPO | Admitting: *Deleted

## 2019-06-07 ENCOUNTER — Other Ambulatory Visit: Payer: Self-pay

## 2019-06-07 DIAGNOSIS — E538 Deficiency of other specified B group vitamins: Secondary | ICD-10-CM | POA: Diagnosis not present

## 2019-06-07 MED ORDER — CYANOCOBALAMIN 1000 MCG/ML IJ SOLN
1000.0000 ug | Freq: Once | INTRAMUSCULAR | Status: AC
  Administered 2019-06-07: 11:00:00 1000 ug via INTRAMUSCULAR

## 2019-06-07 NOTE — Progress Notes (Addendum)
Patient presented for B 12 injection to right deltoid, patient voiced no concerns nor showed any signs of distress during injection.  Reviewed.  Dr Scott 

## 2019-07-12 ENCOUNTER — Ambulatory Visit: Payer: BC Managed Care – PPO

## 2019-09-18 ENCOUNTER — Encounter: Payer: BC Managed Care – PPO | Admitting: Internal Medicine

## 2019-11-09 ENCOUNTER — Other Ambulatory Visit: Payer: Self-pay

## 2019-11-09 ENCOUNTER — Ambulatory Visit (INDEPENDENT_AMBULATORY_CARE_PROVIDER_SITE_OTHER): Payer: BC Managed Care – PPO

## 2019-11-09 DIAGNOSIS — E538 Deficiency of other specified B group vitamins: Secondary | ICD-10-CM | POA: Diagnosis not present

## 2019-11-09 MED ORDER — CYANOCOBALAMIN 1000 MCG/ML IJ SOLN
1000.0000 ug | Freq: Once | INTRAMUSCULAR | Status: AC
  Administered 2019-11-09: 1000 ug via INTRAMUSCULAR

## 2019-11-09 NOTE — Progress Notes (Signed)
Patient presented for B 12 injection to left deltoid, patient voiced no concerns nor showed any signs of distress during injection. 

## 2019-11-27 ENCOUNTER — Other Ambulatory Visit: Payer: Self-pay

## 2019-11-27 ENCOUNTER — Ambulatory Visit
Admission: RE | Admit: 2019-11-27 | Discharge: 2019-11-27 | Disposition: A | Discharge status: Home / Self Care | Payer: BC Managed Care – PPO | Source: Ambulatory Visit | Attending: Internal Medicine | Admitting: Internal Medicine

## 2019-11-27 ENCOUNTER — Other Ambulatory Visit: Payer: Self-pay | Admitting: Internal Medicine

## 2019-11-27 DIAGNOSIS — Z1239 Encounter for other screening for malignant neoplasm of breast: Secondary | ICD-10-CM

## 2019-11-27 DIAGNOSIS — Z1231 Encounter for screening mammogram for malignant neoplasm of breast: Secondary | ICD-10-CM | POA: Insufficient documentation

## 2019-11-28 ENCOUNTER — Other Ambulatory Visit: Payer: Self-pay | Admitting: Internal Medicine

## 2019-11-28 DIAGNOSIS — R928 Other abnormal and inconclusive findings on diagnostic imaging of breast: Secondary | ICD-10-CM

## 2019-11-28 NOTE — Progress Notes (Signed)
Order placed for diagnostic left breast mammogram and ultrasound.

## 2019-11-29 ENCOUNTER — Encounter: Payer: Self-pay | Admitting: *Deleted

## 2019-11-29 ENCOUNTER — Telehealth: Payer: Self-pay | Admitting: *Deleted

## 2019-11-29 NOTE — Telephone Encounter (Signed)
Left voicemail to return call or respond to [employer].

## 2019-11-29 NOTE — Telephone Encounter (Signed)
Spoke with patient, see result note

## 2019-11-29 NOTE — Telephone Encounter (Signed)
I called & spoke with patient over the phone. See result note

## 2019-12-03 ENCOUNTER — Other Ambulatory Visit: Payer: Self-pay

## 2019-12-03 ENCOUNTER — Emergency Department: Payer: BC Managed Care – PPO

## 2019-12-03 ENCOUNTER — Encounter: Payer: Self-pay | Admitting: Emergency Medicine

## 2019-12-03 ENCOUNTER — Emergency Department
Admission: EM | Admit: 2019-12-03 | Discharge: 2019-12-03 | Disposition: A | Discharge status: Home / Self Care | Payer: BC Managed Care – PPO | Attending: Emergency Medicine | Admitting: Emergency Medicine

## 2019-12-03 DIAGNOSIS — R0789 Other chest pain: Secondary | ICD-10-CM

## 2019-12-03 DIAGNOSIS — Z87891 Personal history of nicotine dependence: Secondary | ICD-10-CM | POA: Diagnosis not present

## 2019-12-03 DIAGNOSIS — R079 Chest pain, unspecified: Secondary | ICD-10-CM | POA: Diagnosis not present

## 2019-12-03 DIAGNOSIS — Z79899 Other long term (current) drug therapy: Secondary | ICD-10-CM | POA: Insufficient documentation

## 2019-12-03 LAB — CBC
HCT: 40.5 % (ref 36.0–46.0)
Hemoglobin: 13.5 g/dL (ref 12.0–15.0)
MCH: 33.8 pg (ref 26.0–34.0)
MCHC: 33.3 g/dL (ref 30.0–36.0)
MCV: 101.5 fL — ABNORMAL HIGH (ref 80.0–100.0)
Platelets: 134 10*3/uL — ABNORMAL LOW (ref 150–400)
RBC: 3.99 MIL/uL (ref 3.87–5.11)
RDW: 12.2 % (ref 11.5–15.5)
WBC: 4 10*3/uL (ref 4.0–10.5)
nRBC: 0 % (ref 0.0–0.2)

## 2019-12-03 LAB — BASIC METABOLIC PANEL
Anion gap: 7 (ref 5–15)
BUN: 24 mg/dL — ABNORMAL HIGH (ref 8–23)
CO2: 26 mmol/L (ref 22–32)
Calcium: 9.3 mg/dL (ref 8.9–10.3)
Chloride: 105 mmol/L (ref 98–111)
Creatinine, Ser: 0.86 mg/dL (ref 0.44–1.00)
GFR calc Af Amer: >60 mL/min (ref >60)
GFR calc non Af Amer: >60 mL/min (ref >60)
Glucose, Bld: 107 mg/dL — ABNORMAL HIGH (ref 70–99)
Potassium: 4.1 mmol/L (ref 3.5–5.1)
Sodium: 138 mmol/L (ref 135–145)

## 2019-12-03 LAB — TROPONIN I (HIGH SENSITIVITY): Troponin I (High Sensitivity): 3 ng/L (ref <18)

## 2019-12-03 MED ORDER — MELOXICAM 7.5 MG PO TABS
15.0000 mg | ORAL_TABLET | Freq: Once | ORAL | Status: AC
  Administered 2019-12-03: 15 mg via ORAL
  Filled 2019-12-03: qty 2

## 2019-12-03 MED ORDER — MELOXICAM 15 MG PO TABS: 15.0000 mg | ORAL_TABLET | Freq: Every day | ORAL | 0 refills | Status: DC

## 2019-12-03 NOTE — ED Triage Notes (Signed)
Presents with shooting pain to left breast area yesterday  This am the pain went into left arm

## 2019-12-03 NOTE — ED Provider Notes (Signed)
[facility] Emergency Department Provider Note  ____________________________________________  Time seen: Approximately 2:14 PM  I have reviewed the triage vital signs and the nursing notes.   HISTORY  Chief Complaint Chest Pain    HPI Leslie Davidson is a 59 y.o. female who presents the emergency department complaining of left-sided chest pain.  Patient states that symptoms began yesterday.  Symptoms are very intermittent in nature.  Do not seem to be associated with movement, breathing.  Patient describes it as a sharp sensation running along her left ribs.  Patient states that she has no cardiac history.  She does have a history of GERD and sometimes does have left-sided pain with her GERD symptoms though this is not quite the typical pain for her.  She states that she had a mammogram performed earlier this week and is scheduled for a follow-up for nonspecific finding in the left breast.  Patient states that palpation does seem to exacerbate the symptoms.  No shortness of breath or difficulty breathing.  No fevers or chills, nasal congestion, sore throat, cough.  Patient does have bilateral breast augmentation performed 20 years ago.  It has been roughly 2 years since her last mammogram prior to this week.   Patient is having no skin changes to breast tissue.  No nipple discharge.        Past Medical History:  Diagnosis Date  . Arthritis   . GERD (gastroesophageal reflux disease)     Patient Active Problem List   Diagnosis Date Noted  . B12 deficiency 09/22/2018  . SOB (shortness of breath) 09/11/2018  . Healthcare maintenance 08/22/2016  . Abdominal pain, epigastric 11/14/2015  . Abnormal mammogram 05/27/2015  . Arm pain 04/22/2015  . Elevated glucose level 04/22/2015  . Hyperlipidemia 04/22/2015  . GERD (gastroesophageal reflux disease) 11/09/2014  . Arthritis 11/09/2014    Past Surgical History:  Procedure Laterality Date  . AUGMENTATION  MAMMAPLASTY Bilateral    2000  . BREAST ENHANCEMENT SURGERY  2000    Prior to Admission medications   Medication Sig Start Date End Date Taking? Authorizing Provider  meloxicam (MOBIC) 15 MG tablet Take 1 tablet (15 mg total) by mouth daily. 12/03/19   Marielis Samara, Charline Bills, PA-C  Probiotic Product (PROBIOTIC PO) Take by mouth.    [provider]  scopolamine (TRANSDERM SCOP, 1.5 MG,) 1 MG/3DAYS Place 1 patch (1.5 mg total) onto the skin every 3 (three) days. Place behind ear and change q 3 days 11/28/18   Einar Pheasant, MD    Allergies Paroxetine  Family History  Problem Relation Age of Onset  . Diabetes Mother   . Cancer Father        Lung cancer  . Arthritis Maternal Grandmother   . Breast cancer Neg Hx     Social History Social History   Tobacco Use  . Smoking status: Former Smoker    Types: Cigarettes    Quit date: 03/31/1983    Years since quitting: 36.7  . Smokeless tobacco: Never Used  Substance Use Topics  . Alcohol use: Yes    Alcohol/week: 0.0 standard drinks    Comment: Occasional   . Drug use: No     Review of Systems  Constitutional: No fever/chills Eyes: No visual changes. No discharge ENT: No upper respiratory complaints. Cardiovascular: Left-sided chest/rib pain Respiratory: no cough. No SOB. Gastrointestinal: No abdominal pain.  No nausea, no vomiting.  No diarrhea.  No constipation. Musculoskeletal: Negative for musculoskeletal pain. Skin: Negative for  rash, abrasions, lacerations, ecchymosis. Neurological: Negative for headaches, focal weakness or numbness. 10-point ROS otherwise negative.  ____________________________________________   PHYSICAL EXAM:  VITAL SIGNS: ED Triage Vitals  Enc Vitals Group     BP 12/03/19 0650 (!) 127/59     Pulse Rate 12/03/19 0650 63     Resp 12/03/19 0650 20     Temp 12/03/19 0650 97.8 F (36.6 C)     Temp Source 12/03/19 0650 Oral     SpO2 12/03/19 0650 100 %     Weight 12/03/19 0650 103 lb  (46.7 kg)     Height 12/03/19 0650 5\' 3"  (1.6 m)     Head Circumference --      Peak Flow --      Pain Score 12/03/19 0704 1     Pain Loc --      Pain Edu? --      Excl. in [place]? --      Constitutional: Alert and oriented. Well appearing and in no acute distress. Eyes: Conjunctivae are normal. PERRL. EOMI. Head: Atraumatic. ENT:      Ears:       Nose: No congestion/rhinnorhea.      Mouth/Throat: Mucous membranes are moist.  Neck: No stridor.  No cervical spine tenderness to palpation.  Cardiovascular: Normal rate, regular rhythm. Normal S1 and S2.  No murmurs, rubs, gallops.  Good peripheral circulation. Respiratory: Normal respiratory effort without tachypnea or retractions. Lungs CTAB. Good air entry to the bases with no decreased or absent breath sounds. Gastrointestinal: Bowel sounds 4 quadrants. Soft and nontender to palpation. No guarding or rigidity. No palpable masses. No distention. No CVA tenderness. Musculoskeletal: Full range of motion to all extremities. No gross deformities appreciated.  Palpation along the left sternal border extending into the left ribs does reproduce tenderness.  No palpable abnormality.  No crepitus.  No subcutaneous emphysema.  Equal chest rise and fall.  No paradoxical chest wall movement.  Good underlying breath sounds bilaterally. Neurologic:  Normal speech and language. No gross focal neurologic deficits are appreciated.  Skin:  Skin is warm, dry and intact. No rash noted. Psychiatric: Mood and affect are normal. Speech and behavior are normal. Patient exhibits appropriate insight and judgement.   ____________________________________________   LABS (all labs ordered are listed, but only abnormal results are displayed)  Labs Reviewed  BASIC METABOLIC PANEL - Abnormal; Notable for the following components:      Result Value   Glucose, Bld 107 (*)    BUN 24 (*)    All other components within normal limits  CBC - Abnormal; Notable for the  following components:   MCV 101.5 (*)    Platelets 134 (*)    All other components within normal limits  TROPONIN I (HIGH SENSITIVITY)  TROPONIN I (HIGH SENSITIVITY)   ____________________________________________  EKG   ____________________________________________  RADIOLOGY I personally viewed and evaluated these images as part of my medical decision making, as well as reviewing the written report by the radiologist.  DG Chest 2 View  Result Date: 12/03/2019 CLINICAL DATA:  Chest pain EXAM: CHEST - 2 VIEW COMPARISON:  None. FINDINGS: Lungs are clear.  No pleural effusion or pneumothorax. The heart is normal in size. Visualized osseous structures are within normal limits. IMPRESSION: Normal chest radiographs. Electronically Signed   By: Julian Hy M.D.   On: 12/03/2019 07:53    ____________________________________________    PROCEDURES  Procedure(s) performed:    Procedures    Medications  meloxicam (MOBIC)  tablet 15 mg (15 mg Oral Given 12/03/19 1510)     ____________________________________________   INITIAL IMPRESSION / ASSESSMENT AND PLAN / ED COURSE  Pertinent labs & imaging results that were available during my care of the patient were reviewed by me and considered in my medical decision making (see chart for details).  Review of the [place] CSRS was performed in accordance of the [place] prior to dispensing any controlled drugs.           Patient's diagnosis is consistent with left chest wall pain.  Patient presented to emergency department complaining of pain to the left side of the chest.  This is reproducible, intermittent in nature.  No cardiac history.  EKG is reassuring, labs are reassuring.  At this time patient states that she had had a recent mammogram and did find a nonspecific soft tissue finding requiring further work-up with ultrasound and mammogram.  Patient states that the symptoms began after the breast tissue manipulation.  She does have a  history of breast augmentation with saline implant 20 years ago.  Differential included ACS/STEMI, pneumonia, bronchitis, PE, costochondritis, breast abscess, contusion of the breast tissue.  With reproducible symptoms with palpation, reassuring labs, no cardiac history, I feel that it is likely secondary to the recent mammogram.  Patient has either had minor scar tissue injury due to manipulation of her implant versus contusion of the tissue/costochondritis.  This is not pleuritic in nature.  I am unable to [facility] the patient only due to age, however she would otherwise be PERC negative.  I discussed my differential with the patient and she feels that it is likely secondary to mammogram.  I offered CT scan of the chest for PE and the patient does not feel that this is necessary at this time.  Again this is very low on my differential and I agree with the patient decision not to pursue CT at this time.  I will place the patient on anti-inflammatory.  She has an appointment this week to see primary care and will follow up with her regarding the symptoms.  If patient has improvement of symptoms with conservative medications, no further work-up.  If patient has ongoing symptoms discussed with primary care.  If patient has a worsening or changes symptoms she should return to the emergency department..  Patient will be prescribed meloxicam for symptom relief.  Patient is given ED precautions to return to the ED for any worsening or new symptoms.     ____________________________________________  FINAL CLINICAL IMPRESSION(S) / ED DIAGNOSES  Final diagnoses:  Chest wall pain      NEW MEDICATIONS STARTED DURING THIS VISIT:  ED Discharge Orders         Ordered    meloxicam (MOBIC) 15 MG tablet  Daily        12/03/19 1506              This chart was dictated using voice recognition software/Dragon. Despite best efforts to proofread, errors can occur which can change the meaning. Any change was  purely unintentional.    Darletta Moll, PA-C 12/03/19 1516    Blake Divine, MD 12/03/19 [phone]

## 2019-12-05 ENCOUNTER — Other Ambulatory Visit: Payer: Self-pay

## 2019-12-05 ENCOUNTER — Ambulatory Visit (INDEPENDENT_AMBULATORY_CARE_PROVIDER_SITE_OTHER): Payer: BC Managed Care – PPO | Admitting: Internal Medicine

## 2019-12-05 ENCOUNTER — Encounter: Payer: Self-pay | Admitting: Internal Medicine

## 2019-12-05 VITALS — BP 100/70 | HR 65 | Temp 98.1°F | Resp 16 | Ht 63.0 in | Wt 100.0 lb

## 2019-12-05 DIAGNOSIS — E785 Hyperlipidemia, unspecified: Secondary | ICD-10-CM

## 2019-12-05 DIAGNOSIS — R928 Other abnormal and inconclusive findings on diagnostic imaging of breast: Secondary | ICD-10-CM | POA: Diagnosis not present

## 2019-12-05 DIAGNOSIS — Z23 Encounter for immunization: Secondary | ICD-10-CM | POA: Diagnosis not present

## 2019-12-05 DIAGNOSIS — Z0000 Encounter for general adult medical examination without abnormal findings: Secondary | ICD-10-CM | POA: Diagnosis not present

## 2019-12-05 DIAGNOSIS — E538 Deficiency of other specified B group vitamins: Secondary | ICD-10-CM | POA: Diagnosis not present

## 2019-12-05 DIAGNOSIS — R079 Chest pain, unspecified: Secondary | ICD-10-CM | POA: Diagnosis not present

## 2019-12-05 NOTE — Assessment & Plan Note (Addendum)
Physical today 12/05/19.  PAP 09/06/18 - negative with negative HPV.  Per note, had colonoscopy.  Need report.

## 2019-12-05 NOTE — Progress Notes (Signed)
Patient ID: Leslie Davidson, female   DOB: 30-Apr-1960, 59 y.o.   MRN: [id number]   Subjective:    Patient ID: Leslie Davidson, female    DOB: 1960/10/22, 59 y.o.   MRN: [id number]  HPI This visit occurred during the SARS-CoV-2 public health emergency.  Safety protocols were in place, including screening questions prior to the visit, additional usage of staff PPE, and extensive cleaning of exam room while observing appropriate contact time as indicated for disinfecting solutions.  Patient here for her physical exam.  Increased stress with her mother's health issues.  Discussed with her today.  Overall she feels she is handling things well.  Stays active.  Went to ER 12/03/19 with left side chest pain.  Described as sharp sensation running along her ribs.  Had mammogram earlier in the week and is scheduled for f/u views.  No sob.  Was questioning if could be acid reflux. No nasal congestion, chest congestion cough.  She did lift her grandchild prior to this occurring.  Per ER note - reproducible.  EKG reassuring.  Lab reassuring. ER felt related to recent mammogram.  Recommended antiinflammatory medication.  Is feeling better.  Still with some deep soreness.  Has f/u mammogram scheduled.  No pain with deep breathing.  No abdominal pain.  Bowels moving.    Past Medical History:  Diagnosis Date  . Arthritis   . GERD (gastroesophageal reflux disease)    Past Surgical History:  Procedure Laterality Date  . AUGMENTATION MAMMAPLASTY Bilateral    2000  . BREAST ENHANCEMENT SURGERY  2000   Family History  Problem Relation Age of Onset  . Diabetes Mother   . Cancer Father        Lung cancer  . Arthritis Maternal Grandmother   . Breast cancer Neg Hx    Social History   Socioeconomic History  . Marital status: Married    Spouse name: Not on file  . Number of children: Not on file  . Years of education: Not on file  . Highest education level: Not on file  Occupational History  . Not on file  Tobacco Use   . Smoking status: Former Smoker    Types: Cigarettes    Quit date: 03/31/1983    Years since quitting: 36.7  . Smokeless tobacco: Never Used  Substance and Sexual Activity  . Alcohol use: Yes    Alcohol/week: 0.0 standard drinks    Comment: Occasional   . Drug use: No  . Sexual activity: Yes    Partners: Male    Birth control/protection: Post-menopausal    Comment: Husband  Other Topics Concern  . Not on file  Social History Narrative   Retired from the school system    Lives with husband    Children- 3- son and 2 daughters    Pets: 1 dog lives inside    Caffeine- Coffee 3-4 cups, 1 cup diet pepsi   Social Determinants of Health   Financial Resource Strain:   . Difficulty of Paying Living Expenses: Not on file  Food Insecurity:   . Worried About Charity fundraiser in the Last Year: Not on file  . Ran Out of Food in the Last Year: Not on file  Transportation Needs:   . Lack of Transportation (Medical): Not on file  . Lack of Transportation (Non-Medical): Not on file  Physical Activity:   . Days of Exercise per Week: Not on file  . Minutes of Exercise per Session: Not on  file  Stress:   . Feeling of Stress : Not on file  Social Connections:   . Frequency of Communication with Friends and Family: Not on file  . Frequency of Social Gatherings with Friends and Family: Not on file  . Attends Religious Services: Not on file  . Active Member of Clubs or Organizations: Not on file  . Attends Archivist Meetings: Not on file  . Marital Status: Not on file    Outpatient Encounter Medications as of 12/05/2019  Medication Sig  . meloxicam (MOBIC) 15 MG tablet Take 1 tablet (15 mg total) by mouth daily.  . Probiotic Product (PROBIOTIC PO) Take by mouth.  . [DISCONTINUED] scopolamine (TRANSDERM SCOP, 1.5 MG,) 1 MG/3DAYS Place 1 patch (1.5 mg total) onto the skin every 3 (three) days. Place behind ear and change q 3 days   No facility-administered encounter medications  on file as of 12/05/2019.    Review of Systems  Constitutional: Negative for appetite change and unexpected weight change.  HENT: Negative for congestion and sinus pressure.   Eyes: Negative for pain and visual disturbance.  Respiratory: Negative for cough, chest tightness and shortness of breath.   Cardiovascular: Positive for chest pain. Negative for palpitations and leg swelling.  Gastrointestinal: Negative for abdominal pain, diarrhea, nausea and vomiting.  Genitourinary: Negative for difficulty urinating and dysuria.  Musculoskeletal: Negative for joint swelling and myalgias.  Skin: Negative for color change and rash.  Neurological: Negative for dizziness, light-headedness and headaches.  Hematological: Negative for adenopathy. Does not bruise/bleed easily.  Psychiatric/Behavioral: Negative for agitation and dysphoric mood.       Objective:    Physical Exam Vitals reviewed.  Constitutional:      General: She is not in acute distress.    Appearance: Normal appearance.  HENT:     Head: Normocephalic and atraumatic.     Right Ear: External ear normal.     Left Ear: External ear normal.  Eyes:     General: No scleral icterus.       Right eye: No discharge.        Left eye: No discharge.     Conjunctiva/sclera: Conjunctivae normal.  Neck:     Thyroid: No thyromegaly.  Cardiovascular:     Rate and Rhythm: Normal rate and regular rhythm.  Pulmonary:     Effort: No respiratory distress.     Breath sounds: Normal breath sounds. No wheezing.     Comments: Breasts:  No nipple discharge or nipple retraction present.  Implants - bilateral.  Could not appreciate distinct nodule or axillary adenopathy.  Abdominal:     General: Bowel sounds are normal.     Palpations: Abdomen is soft.     Tenderness: There is no abdominal tenderness.  Musculoskeletal:        General: No swelling or tenderness.     Cervical back: Neck supple. No tenderness.  Lymphadenopathy:     Cervical: No  cervical adenopathy.  Skin:    Findings: No erythema or rash.  Neurological:     General: No focal deficit present.     Mental Status: She is alert.  Psychiatric:        Mood and Affect: Mood normal.        Behavior: Behavior normal.     BP 100/70   Pulse 65   Temp 98.1 F (36.7 C) (Oral)   Resp 16   Ht 5\' 3"  (1.6 m)   Wt 100 lb (45.4 kg)  LMP 01/02/2013   SpO2 98%   BMI 17.71 kg/m  Wt Readings from Last 3 Encounters:  12/05/19 100 lb (45.4 kg)  12/03/19 103 lb (46.7 kg)  11/11/18 100 lb (45.4 kg)     Lab Results  Component Value Date   WBC 4.0 12/03/2019   HGB 13.5 12/03/2019   HCT 40.5 12/03/2019   PLT 134 (L) 12/03/2019   GLUCOSE 107 (H) 12/03/2019   CHOL 229 (H) 09/06/2018   TRIG 72.0 09/06/2018   HDL 98.50 09/06/2018   LDLCALC 116 (H) 09/06/2018   ALT 29 09/06/2018   AST 31 09/06/2018   NA 138 12/03/2019   K 4.1 12/03/2019   CL 105 12/03/2019   CREATININE 0.86 12/03/2019   BUN 24 (H) 12/03/2019   CO2 26 12/03/2019   TSH 1.29 09/06/2018   HGBA1C 5.9 09/06/2018    DG Chest 2 View  Result Date: 12/03/2019 CLINICAL DATA:  Chest pain EXAM: CHEST - 2 VIEW COMPARISON:  None. FINDINGS: Lungs are clear.  No pleural effusion or pneumothorax. The heart is normal in size. Visualized osseous structures are within normal limits. IMPRESSION: Normal chest radiographs. Electronically Signed   By: Julian Hy M.D.   On: 12/03/2019 07:53       Assessment & Plan:   Problem List Items Addressed This Visit    Hyperlipidemia    Follow lipid panel.       Relevant Orders   CBC with Differential/Platelet   Comprehensive metabolic panel   TSH   Lipid panel   Healthcare maintenance    Physical today 12/05/19.  PAP 09/06/18 - negative with negative HPV.  Per note, had colonoscopy.  Need report.        Chest pain    Evaluated in ER as outlined.  EKG, CXR and labs reassuring.  Still some deep discomfort.  No brought on by increased activity or exertion.  Was  questioning acid reflux.  Consider pepcid.  Was questioning if related to mammogram. Has f/u next week.  Consider further cardiac w/up (referral to cardiology for further evaluation and question of need for further testing).  Feels meloxicam is helping.       B12 deficiency    Continue B12 injections.       Relevant Orders   Vitamin B12   Abnormal mammogram    Recent mammogram recommended f/u views.  Scheduled for f/u.         Other Visit Diagnoses    Need for immunization against influenza    -  Primary   Relevant Orders   Flu Vaccine QUAD 36+ mos IM (Completed)       Einar Pheasant, MD

## 2019-12-10 ENCOUNTER — Encounter: Payer: Self-pay | Admitting: Internal Medicine

## 2019-12-10 DIAGNOSIS — R079 Chest pain, unspecified: Secondary | ICD-10-CM | POA: Insufficient documentation

## 2019-12-10 NOTE — Assessment & Plan Note (Signed)
Follow lipid panel.   

## 2019-12-10 NOTE — Assessment & Plan Note (Signed)
Continue B12 injections.   

## 2019-12-10 NOTE — Assessment & Plan Note (Addendum)
Evaluated in ER as outlined.  EKG, CXR and labs reassuring.  Still some deep discomfort.  No brought on by increased activity or exertion.  Was questioning acid reflux.  Consider pepcid.  Was questioning if related to mammogram. Has f/u next week.  Consider further cardiac w/up (referral to cardiology for further evaluation and question of need for further testing).  Feels meloxicam is helping.

## 2019-12-10 NOTE — Assessment & Plan Note (Signed)
Recent mammogram recommended f/u views.  Scheduled for f/u.

## 2019-12-11 ENCOUNTER — Ambulatory Visit
Admission: RE | Admit: 2019-12-11 | Discharge: 2019-12-11 | Disposition: A | Discharge status: Home / Self Care | Payer: BC Managed Care – PPO | Source: Ambulatory Visit | Attending: Internal Medicine | Admitting: Internal Medicine

## 2019-12-11 ENCOUNTER — Other Ambulatory Visit: Payer: Self-pay

## 2019-12-11 DIAGNOSIS — N6002 Solitary cyst of left breast: Secondary | ICD-10-CM | POA: Diagnosis not present

## 2019-12-11 DIAGNOSIS — R928 Other abnormal and inconclusive findings on diagnostic imaging of breast: Secondary | ICD-10-CM

## 2019-12-12 ENCOUNTER — Ambulatory Visit (INDEPENDENT_AMBULATORY_CARE_PROVIDER_SITE_OTHER): Payer: BC Managed Care – PPO

## 2019-12-12 DIAGNOSIS — E538 Deficiency of other specified B group vitamins: Secondary | ICD-10-CM

## 2019-12-12 MED ORDER — CYANOCOBALAMIN 1000 MCG/ML IJ SOLN
1000.0000 ug | Freq: Once | INTRAMUSCULAR | Status: AC
  Administered 2019-12-12: 1000 ug via INTRAMUSCULAR

## 2019-12-12 NOTE — Progress Notes (Addendum)
Patient presented for B 12 injection to right deltoid, patient voiced no concerns nor showed any signs of distress during injection.  Reviewed.  Dr Scott 

## 2020-01-23 ENCOUNTER — Other Ambulatory Visit: Payer: Self-pay

## 2020-01-23 ENCOUNTER — Other Ambulatory Visit: Payer: BC Managed Care – PPO

## 2020-01-23 ENCOUNTER — Ambulatory Visit (INDEPENDENT_AMBULATORY_CARE_PROVIDER_SITE_OTHER): Payer: BC Managed Care – PPO

## 2020-01-23 DIAGNOSIS — E538 Deficiency of other specified B group vitamins: Secondary | ICD-10-CM

## 2020-01-23 MED ORDER — CYANOCOBALAMIN 1000 MCG/ML IJ SOLN
1000.0000 ug | Freq: Once | INTRAMUSCULAR | Status: AC
  Administered 2020-01-23: 1000 ug via INTRAMUSCULAR

## 2020-01-23 NOTE — Progress Notes (Addendum)
Patient presented for B 12 injection to right deltoid, patient voiced no concerns nor showed any signs of distress during injection.  Reviewed.  Dr Scott 

## 2020-01-23 NOTE — Addendum Note (Signed)
Addended by: Tor Netters I on: 01/23/2020 10:38 AM   Modules accepted: Orders

## 2020-02-27 ENCOUNTER — Other Ambulatory Visit: Payer: Self-pay

## 2020-02-27 ENCOUNTER — Other Ambulatory Visit (INDEPENDENT_AMBULATORY_CARE_PROVIDER_SITE_OTHER): Payer: BC Managed Care – PPO

## 2020-02-27 ENCOUNTER — Ambulatory Visit (INDEPENDENT_AMBULATORY_CARE_PROVIDER_SITE_OTHER): Payer: BC Managed Care – PPO

## 2020-02-27 ENCOUNTER — Other Ambulatory Visit: Payer: Self-pay | Admitting: Internal Medicine

## 2020-02-27 DIAGNOSIS — E785 Hyperlipidemia, unspecified: Secondary | ICD-10-CM | POA: Diagnosis not present

## 2020-02-27 DIAGNOSIS — E538 Deficiency of other specified B group vitamins: Secondary | ICD-10-CM

## 2020-02-27 DIAGNOSIS — D696 Thrombocytopenia, unspecified: Secondary | ICD-10-CM

## 2020-02-27 LAB — LIPID PANEL
Cholesterol: 239 mg/dL — ABNORMAL HIGH (ref 0–200)
HDL: 102.3 mg/dL (ref >39.00)
LDL Cholesterol: 124 mg/dL — ABNORMAL HIGH (ref 0–99)
NonHDL: 136.2
Total CHOL/HDL Ratio: 2
Triglycerides: 61 mg/dL (ref 0.0–149.0)
VLDL: 12.2 mg/dL (ref 0.0–40.0)

## 2020-02-27 LAB — CBC WITH DIFFERENTIAL/PLATELET
Basophils Absolute: 0 10*3/uL (ref 0.0–0.1)
Basophils Relative: 1.1 % (ref 0.0–3.0)
Eosinophils Absolute: 0.1 10*3/uL (ref 0.0–0.7)
Eosinophils Relative: 1.7 % (ref 0.0–5.0)
HCT: 42.5 % (ref 36.0–46.0)
Hemoglobin: 14.2 g/dL (ref 12.0–15.0)
Lymphocytes Relative: 40.1 % (ref 12.0–46.0)
Lymphs Abs: 1.6 10*3/uL (ref 0.7–4.0)
MCHC: 33.4 g/dL (ref 30.0–36.0)
MCV: 99.7 fl (ref 78.0–100.0)
Monocytes Absolute: 0.3 10*3/uL (ref 0.1–1.0)
Monocytes Relative: 8.2 % (ref 3.0–12.0)
Neutro Abs: 1.9 10*3/uL (ref 1.4–7.7)
Neutrophils Relative %: 48.9 % (ref 43.0–77.0)
Platelets: 123 10*3/uL — ABNORMAL LOW (ref 150.0–400.0)
RBC: 4.26 Mil/uL (ref 3.87–5.11)
RDW: 12.8 % (ref 11.5–15.5)
WBC: 3.9 10*3/uL — ABNORMAL LOW (ref 4.0–10.5)

## 2020-02-27 LAB — COMPREHENSIVE METABOLIC PANEL
ALT: 21 U/L (ref 0–35)
AST: 24 U/L (ref 0–37)
Albumin: 4.4 g/dL (ref 3.5–5.2)
Alkaline Phosphatase: 68 U/L (ref 39–117)
BUN: 16 mg/dL (ref 6–23)
CO2: 27 mEq/L (ref 19–32)
Calcium: 9.2 mg/dL (ref 8.4–10.5)
Chloride: 100 mEq/L (ref 96–112)
Creatinine, Ser: 0.8 mg/dL (ref 0.40–1.20)
GFR: 79.58 mL/min (ref >60.00)
Glucose, Bld: 80 mg/dL (ref 70–99)
Potassium: 4.3 mEq/L (ref 3.5–5.1)
Sodium: 135 mEq/L (ref 135–145)
Total Bilirubin: 0.6 mg/dL (ref 0.2–1.2)
Total Protein: 6.7 g/dL (ref 6.0–8.3)

## 2020-02-27 LAB — TSH: TSH: 1.62 u[IU]/mL (ref 0.35–4.50)

## 2020-02-27 LAB — VITAMIN B12: Vitamin B-12: 305 pg/mL (ref 211–911)

## 2020-02-27 MED ORDER — CYANOCOBALAMIN 1000 MCG/ML IJ SOLN
1000.0000 ug | Freq: Once | INTRAMUSCULAR | Status: AC
  Administered 2020-02-27: 1000 ug via INTRAMUSCULAR

## 2020-02-27 NOTE — Progress Notes (Addendum)
Patient presented for B 12 injection to left deltoid, patient voiced no concerns nor showed any signs of distress during injection.  Reviewed.  Dr Scott 

## 2020-02-27 NOTE — Progress Notes (Signed)
Order placed for f/u cbc.   

## 2020-04-02 ENCOUNTER — Other Ambulatory Visit: Payer: BC Managed Care – PPO

## 2020-04-02 ENCOUNTER — Ambulatory Visit: Payer: BC Managed Care – PPO

## 2020-04-04 ENCOUNTER — Other Ambulatory Visit: Payer: Self-pay

## 2020-04-04 ENCOUNTER — Ambulatory Visit (INDEPENDENT_AMBULATORY_CARE_PROVIDER_SITE_OTHER): Payer: BC Managed Care – PPO | Admitting: *Deleted

## 2020-04-04 DIAGNOSIS — D696 Thrombocytopenia, unspecified: Secondary | ICD-10-CM | POA: Diagnosis not present

## 2020-04-04 DIAGNOSIS — E538 Deficiency of other specified B group vitamins: Secondary | ICD-10-CM

## 2020-04-04 LAB — CBC WITH DIFFERENTIAL/PLATELET
Basophils Absolute: 0 10*3/uL (ref 0.0–0.1)
Basophils Relative: 0.8 % (ref 0.0–3.0)
Eosinophils Absolute: 0 10*3/uL (ref 0.0–0.7)
Eosinophils Relative: 1 % (ref 0.0–5.0)
HCT: 39 % (ref 36.0–46.0)
Hemoglobin: 13.1 g/dL (ref 12.0–15.0)
Lymphocytes Relative: 34.2 % (ref 12.0–46.0)
Lymphs Abs: 1.6 10*3/uL (ref 0.7–4.0)
MCHC: 33.6 g/dL (ref 30.0–36.0)
MCV: 99.6 fl (ref 78.0–100.0)
Monocytes Absolute: 0.3 10*3/uL (ref 0.1–1.0)
Monocytes Relative: 6.8 % (ref 3.0–12.0)
Neutro Abs: 2.6 10*3/uL (ref 1.4–7.7)
Neutrophils Relative %: 57.2 % (ref 43.0–77.0)
Platelets: 145 10*3/uL — ABNORMAL LOW (ref 150.0–400.0)
RBC: 3.91 Mil/uL (ref 3.87–5.11)
RDW: 12.8 % (ref 11.5–15.5)
WBC: 4.6 10*3/uL (ref 4.0–10.5)

## 2020-04-04 MED ORDER — CYANOCOBALAMIN 1000 MCG/ML IJ SOLN
1000.0000 ug | Freq: Once | INTRAMUSCULAR | Status: AC
  Administered 2020-04-04: 1000 ug via INTRAMUSCULAR

## 2020-04-04 NOTE — Progress Notes (Signed)
Patient presented for B 12 injection to right deltoid, patient voiced no concerns nor showed any signs of distress during injection. 

## 2020-04-04 NOTE — Addendum Note (Signed)
Addended by: Dennie Bible on: 04/04/2020 03:23 PM   Modules accepted: Orders

## 2020-05-07 ENCOUNTER — Other Ambulatory Visit: Payer: Self-pay

## 2020-05-07 ENCOUNTER — Ambulatory Visit (INDEPENDENT_AMBULATORY_CARE_PROVIDER_SITE_OTHER): Payer: BC Managed Care – PPO

## 2020-05-07 DIAGNOSIS — E538 Deficiency of other specified B group vitamins: Secondary | ICD-10-CM | POA: Diagnosis not present

## 2020-05-07 MED ORDER — CYANOCOBALAMIN 1000 MCG/ML IJ SOLN
1000.0000 ug | Freq: Once | INTRAMUSCULAR | Status: AC
  Administered 2020-05-07: 1000 ug via INTRAMUSCULAR

## 2020-05-07 NOTE — Progress Notes (Signed)
Patient presented for B 12 injection to left deltoid, patient voiced no concerns nor showed any signs of distress during injection. 

## 2020-06-03 ENCOUNTER — Ambulatory Visit: Payer: BC Managed Care – PPO | Admitting: Internal Medicine

## 2020-06-03 ENCOUNTER — Other Ambulatory Visit: Payer: Self-pay

## 2020-06-03 ENCOUNTER — Encounter: Payer: Self-pay | Admitting: Internal Medicine

## 2020-06-03 DIAGNOSIS — R928 Other abnormal and inconclusive findings on diagnostic imaging of breast: Secondary | ICD-10-CM | POA: Diagnosis not present

## 2020-06-03 DIAGNOSIS — E785 Hyperlipidemia, unspecified: Secondary | ICD-10-CM

## 2020-06-03 DIAGNOSIS — E538 Deficiency of other specified B group vitamins: Secondary | ICD-10-CM

## 2020-06-03 DIAGNOSIS — D696 Thrombocytopenia, unspecified: Secondary | ICD-10-CM

## 2020-06-03 NOTE — Progress Notes (Signed)
Patient ID: Leslie Davidson, female   DOB: 1961/02/17, 60 y.o.   MRN: [id number]   Subjective:    Patient ID: Leslie Davidson, female    DOB: 1960/12/22, 60 y.o.   MRN: [id number]  HPI This visit occurred during the SARS-CoV-2 public health emergency.  Safety protocols were in place, including screening questions prior to the visit, additional usage of staff PPE, and extensive cleaning of exam room while observing appropriate contact time as indicated for disinfecting solutions.  Patient here for a scheduled follow up. Here to follow up regarding previous acid reflux and increased stress.  She is doing better.  Eating well.  No acid reflux. Not taking PPI.  Not needing.  No nausea or vomiting.  No abdominal pain.  Has to take something to make her bowels move, but they have been moving.  Up to date with mammogram and colonoscopy.  Declines covid vaccine.  Had flu vaccine.  Discussed platelet count.  Improved last check.     Past Medical History:  Diagnosis Date  . Arthritis   . GERD (gastroesophageal reflux disease)    Past Surgical History:  Procedure Laterality Date  . AUGMENTATION MAMMAPLASTY Bilateral    2000  . BREAST ENHANCEMENT SURGERY  2000   Family History  Problem Relation Age of Onset  . Diabetes Mother   . Cancer Father        Lung cancer  . Arthritis Maternal Grandmother   . Breast cancer Neg Hx    Social History   Socioeconomic History  . Marital status: Married    Spouse name: Not on file  . Number of children: Not on file  . Years of education: Not on file  . Highest education level: Not on file  Occupational History  . Not on file  Tobacco Use  . Smoking status: Former Smoker    Types: Cigarettes    Quit date: 03/31/1983    Years since quitting: 37.2  . Smokeless tobacco: Never Used  Substance and Sexual Activity  . Alcohol use: Yes    Alcohol/week: 0.0 standard drinks    Comment: Occasional   . Drug use: No  . Sexual activity: Yes    Partners: Male    Birth  control/protection: Post-menopausal    Comment: Husband  Other Topics Concern  . Not on file  Social History Narrative   Retired from the school system    Lives with husband    Children- 3- son and 2 daughters    Pets: 1 dog lives inside    Caffeine- Coffee 3-4 cups, 1 cup diet pepsi   Social Determinants of Health   Financial Resource Strain: Not on file  Food Insecurity: Not on file  Transportation Needs: Not on file  Physical Activity: Not on file  Stress: Not on file  Social Connections: Not on file    Outpatient Encounter Medications as of 06/03/2020  Medication Sig  . meloxicam (MOBIC) 15 MG tablet Take 1 tablet (15 mg total) by mouth daily.  . Probiotic Product (PROBIOTIC PO) Take by mouth.   No facility-administered encounter medications on file as of 06/03/2020.    Review of Systems  Constitutional: Negative for appetite change and unexpected weight change.  HENT: Negative for congestion and sinus pressure.   Respiratory: Negative for cough, chest tightness and shortness of breath.   Cardiovascular: Negative for chest pain, palpitations and leg swelling.  Gastrointestinal: Negative for abdominal pain, diarrhea, nausea and vomiting.  Taking something to make her bowels move.   Genitourinary: Negative for difficulty urinating and dysuria.  Musculoskeletal: Negative for joint swelling and myalgias.  Skin: Negative for color change and rash.  Neurological: Negative for dizziness, light-headedness and headaches.  Psychiatric/Behavioral: Negative for agitation and dysphoric mood.       Objective:    Physical Exam Vitals reviewed.  Constitutional:      General: She is not in acute distress.    Appearance: Normal appearance.  HENT:     Head: Normocephalic and atraumatic.     Right Ear: External ear normal.     Left Ear: External ear normal.  Eyes:     General: No scleral icterus.       Right eye: No discharge.        Left eye: No discharge.      Conjunctiva/sclera: Conjunctivae normal.  Neck:     Thyroid: No thyromegaly.  Cardiovascular:     Rate and Rhythm: Normal rate and regular rhythm.  Pulmonary:     Effort: No respiratory distress.     Breath sounds: Normal breath sounds. No wheezing.  Abdominal:     General: Bowel sounds are normal.     Palpations: Abdomen is soft.     Tenderness: There is no abdominal tenderness.  Musculoskeletal:        General: No swelling or tenderness.     Cervical back: Neck supple. No tenderness.  Lymphadenopathy:     Cervical: No cervical adenopathy.  Skin:    Findings: No erythema or rash.  Neurological:     Mental Status: She is alert.  Psychiatric:        Mood and Affect: Mood normal.        Behavior: Behavior normal.     BP 106/64   Pulse 79   Temp 98.1 F (36.7 C) (Oral)   Resp 16   Ht 5\' 3"  (1.6 m)   Wt 99 lb (44.9 kg)   LMP 01/02/2013   SpO2 99%   BMI 17.54 kg/m  Wt Readings from Last 3 Encounters:  06/03/20 99 lb (44.9 kg)  12/05/19 100 lb (45.4 kg)  12/03/19 103 lb (46.7 kg)     Lab Results  Component Value Date   WBC 4.6 04/04/2020   HGB 13.1 04/04/2020   HCT 39.0 04/04/2020   PLT 145.0 (L) 04/04/2020   GLUCOSE 80 02/27/2020   CHOL 239 (H) 02/27/2020   TRIG 61.0 02/27/2020   HDL 102.30 02/27/2020   LDLCALC 124 (H) 02/27/2020   ALT 21 02/27/2020   AST 24 02/27/2020   NA 135 02/27/2020   K 4.3 02/27/2020   CL 100 02/27/2020   CREATININE 0.80 02/27/2020   BUN 16 02/27/2020   CO2 27 02/27/2020   TSH 1.62 02/27/2020   HGBA1C 5.9 09/06/2018    US BREAST LTD UNI LEFT INC AXILLA  Result Date: 12/11/2019 CLINICAL DATA:  60 year old female recalled from screening mammogram dated 11/27/2019 for a possible left breast mass. EXAM: DIGITAL DIAGNOSTIC LEFT MAMMOGRAM WITH IMPLANTS, CAD AND TOMO ULTRASOUND LEFT BREAST The patient has retropectoral implants. Standard and implant displaced views were performed. COMPARISON:  Previous exam(s). ACR Breast Density  Category b: There are scattered areas of fibroglandular density. FINDINGS: There is a persistent oval, circumscribed equal density mass in the upper inner quadrant of the left breast at middle depth. Further evaluation with ultrasound was performed. Mammographic images were processed with CAD. Targeted ultrasound is performed, showing an oval, circumscribed completely anechoic  mass at the 11 o'clock position 5 cm from the nipple. It measures 4 x 4 x 3 mm. This correlates well with the mammographic finding and is consistent with a benign simple cyst. IMPRESSION: Benign left breast simple cyst corresponding with the screening mammographic findings. No further imaging follow-up required. RECOMMENDATION: Screening mammogram in one year.(Code:SM-B-01Y) I have discussed the findings and recommendations with the patient. If applicable, a reminder letter will be sent to the patient regarding the next appointment. BI-RADS CATEGORY  2: Benign. Electronically Signed   By: Kristopher Oppenheim M.D.   On: 12/11/2019 11:31   MM DIAG BREAST W/IMPLANT TOMO UNI L  Result Date: 12/11/2019 CLINICAL DATA:  60 year old female recalled from screening mammogram dated 11/27/2019 for a possible left breast mass. EXAM: DIGITAL DIAGNOSTIC LEFT MAMMOGRAM WITH IMPLANTS, CAD AND TOMO ULTRASOUND LEFT BREAST The patient has retropectoral implants. Standard and implant displaced views were performed. COMPARISON:  Previous exam(s). ACR Breast Density Category b: There are scattered areas of fibroglandular density. FINDINGS: There is a persistent oval, circumscribed equal density mass in the upper inner quadrant of the left breast at middle depth. Further evaluation with ultrasound was performed. Mammographic images were processed with CAD. Targeted ultrasound is performed, showing an oval, circumscribed completely anechoic mass at the 11 o'clock position 5 cm from the nipple. It measures 4 x 4 x 3 mm. This correlates well with the mammographic finding  and is consistent with a benign simple cyst. IMPRESSION: Benign left breast simple cyst corresponding with the screening mammographic findings. No further imaging follow-up required. RECOMMENDATION: Screening mammogram in one year.(Code:SM-B-01Y) I have discussed the findings and recommendations with the patient. If applicable, a reminder letter will be sent to the patient regarding the next appointment. BI-RADS CATEGORY  2: Benign. Electronically Signed   By: Kristopher Oppenheim M.D.   On: 12/11/2019 11:31       Assessment & Plan:   Problem List Items Addressed This Visit    Abnormal mammogram    Mammogram 11/27/19 recommended f/u left breast mammogram and ultrasound.  F/u 12/11/19 - Birads II.  F/u one year.        B12 deficiency    Continue B12 injections.       Hyperlipidemia    Follow lipid panel.       Thrombocytopenia (HCC)    Platelet count improved on last check.  Follow.        Relevant Orders   CBC with Differential/Platelet       Einar Pheasant, MD

## 2020-06-07 ENCOUNTER — Ambulatory Visit (INDEPENDENT_AMBULATORY_CARE_PROVIDER_SITE_OTHER): Payer: BC Managed Care – PPO | Admitting: *Deleted

## 2020-06-07 ENCOUNTER — Other Ambulatory Visit: Payer: Self-pay

## 2020-06-07 DIAGNOSIS — E538 Deficiency of other specified B group vitamins: Secondary | ICD-10-CM | POA: Diagnosis not present

## 2020-06-07 MED ORDER — CYANOCOBALAMIN 1000 MCG/ML IJ SOLN
1000.0000 ug | Freq: Once | INTRAMUSCULAR | Status: AC
  Administered 2020-06-07: 1000 ug via INTRAMUSCULAR

## 2020-06-07 NOTE — Progress Notes (Addendum)
Patient presented for B 12 injection to right deltoid, patient voiced no concerns nor showed any signs of distress during injection. 

## 2020-06-09 ENCOUNTER — Encounter: Payer: Self-pay | Admitting: Internal Medicine

## 2020-06-09 NOTE — Assessment & Plan Note (Signed)
Continue B12 injections.   

## 2020-06-09 NOTE — Assessment & Plan Note (Signed)
Mammogram 11/27/19 recommended f/u left breast mammogram and ultrasound.  F/u 12/11/19 - Birads II.  F/u one year.

## 2020-06-09 NOTE — Assessment & Plan Note (Signed)
Platelet count improved on last check.  Follow.

## 2020-06-09 NOTE — Assessment & Plan Note (Signed)
Follow lipid panel.   

## 2020-07-09 ENCOUNTER — Ambulatory Visit (INDEPENDENT_AMBULATORY_CARE_PROVIDER_SITE_OTHER): Payer: BC Managed Care – PPO

## 2020-07-09 ENCOUNTER — Other Ambulatory Visit: Payer: Self-pay

## 2020-07-09 DIAGNOSIS — E538 Deficiency of other specified B group vitamins: Secondary | ICD-10-CM | POA: Diagnosis not present

## 2020-07-09 MED ORDER — CYANOCOBALAMIN 1000 MCG/ML IJ SOLN
1000.0000 ug | Freq: Once | INTRAMUSCULAR | Status: AC
  Administered 2020-07-09: 1000 ug via INTRAMUSCULAR

## 2020-07-09 NOTE — Progress Notes (Signed)
Patient presented for B 12 injection to left deltoid, patient voiced no concerns nor showed any signs of distress during injection. 

## 2020-08-12 ENCOUNTER — Other Ambulatory Visit: Payer: Self-pay

## 2020-08-12 ENCOUNTER — Ambulatory Visit (INDEPENDENT_AMBULATORY_CARE_PROVIDER_SITE_OTHER): Payer: BC Managed Care – PPO | Admitting: *Deleted

## 2020-08-12 DIAGNOSIS — E538 Deficiency of other specified B group vitamins: Secondary | ICD-10-CM

## 2020-08-13 ENCOUNTER — Ambulatory Visit: Payer: BC Managed Care – PPO

## 2020-08-13 MED ORDER — CYANOCOBALAMIN 1000 MCG/ML IJ SOLN
1000.0000 ug | Freq: Once | INTRAMUSCULAR | Status: AC
  Administered 2020-08-12: 1000 ug via INTRAMUSCULAR

## 2020-08-13 NOTE — Progress Notes (Signed)
Patient presented for B 12 injection to left deltoid, patient voiced no concerns nor showed any signs of distress during injection. Late documenting given 08/12/20

## 2020-09-12 ENCOUNTER — Ambulatory Visit (INDEPENDENT_AMBULATORY_CARE_PROVIDER_SITE_OTHER): Payer: BC Managed Care – PPO

## 2020-09-12 ENCOUNTER — Other Ambulatory Visit: Payer: Self-pay

## 2020-09-12 DIAGNOSIS — E538 Deficiency of other specified B group vitamins: Secondary | ICD-10-CM | POA: Diagnosis not present

## 2020-09-12 MED ORDER — CYANOCOBALAMIN 1000 MCG/ML IJ SOLN
1000.0000 ug | Freq: Once | INTRAMUSCULAR | Status: AC
  Administered 2020-09-12: 1000 ug via INTRAMUSCULAR

## 2020-09-12 NOTE — Progress Notes (Signed)
Patient presented for B 12 injection to right deltoid, patient voiced no concerns nor showed any signs of distress during injection. 

## 2020-10-15 ENCOUNTER — Ambulatory Visit: Payer: BC Managed Care – PPO

## 2020-10-16 ENCOUNTER — Other Ambulatory Visit: Payer: Self-pay

## 2020-10-16 ENCOUNTER — Ambulatory Visit (INDEPENDENT_AMBULATORY_CARE_PROVIDER_SITE_OTHER): Payer: BC Managed Care – PPO

## 2020-10-16 DIAGNOSIS — E538 Deficiency of other specified B group vitamins: Secondary | ICD-10-CM

## 2020-10-16 MED ORDER — CYANOCOBALAMIN 1000 MCG/ML IJ SOLN
1000.0000 ug | Freq: Once | INTRAMUSCULAR | Status: AC
Start: 2020-10-16 — End: 2020-10-16
  Administered 2020-10-16: 1000 ug via INTRAMUSCULAR

## 2020-10-16 NOTE — Progress Notes (Signed)
Patient presented for B 12 injection to left deltoid, patient voiced no concerns nor showed any signs of distress during injection. 

## 2020-11-19 ENCOUNTER — Other Ambulatory Visit: Payer: Self-pay

## 2020-11-19 ENCOUNTER — Ambulatory Visit (INDEPENDENT_AMBULATORY_CARE_PROVIDER_SITE_OTHER): Payer: BC Managed Care – PPO

## 2020-11-19 DIAGNOSIS — E538 Deficiency of other specified B group vitamins: Secondary | ICD-10-CM

## 2020-11-19 MED ORDER — CYANOCOBALAMIN 1000 MCG/ML IJ SOLN
1000.0000 ug | Freq: Once | INTRAMUSCULAR | Status: AC
  Administered 2020-11-19: 1000 ug via INTRAMUSCULAR

## 2020-11-19 NOTE — Progress Notes (Signed)
Patient presented for B 12 injection to left deltoid, patient voiced no concerns nor showed any signs of distress during injection. 

## 2020-11-21 ENCOUNTER — Encounter: Payer: Self-pay | Admitting: Dermatology

## 2020-11-21 ENCOUNTER — Other Ambulatory Visit: Payer: Self-pay

## 2020-11-21 ENCOUNTER — Ambulatory Visit: Payer: BC Managed Care – PPO | Admitting: Dermatology

## 2020-11-21 DIAGNOSIS — D1800 Hemangioma unspecified site: Secondary | ICD-10-CM

## 2020-11-21 DIAGNOSIS — D2339 Other benign neoplasm of skin of other parts of face: Secondary | ICD-10-CM | POA: Diagnosis not present

## 2020-11-21 DIAGNOSIS — Z1283 Encounter for screening for malignant neoplasm of skin: Secondary | ICD-10-CM | POA: Diagnosis not present

## 2020-11-21 DIAGNOSIS — D229 Melanocytic nevi, unspecified: Secondary | ICD-10-CM

## 2020-11-21 DIAGNOSIS — L578 Other skin changes due to chronic exposure to nonionizing radiation: Secondary | ICD-10-CM

## 2020-11-21 DIAGNOSIS — L814 Other melanin hyperpigmentation: Secondary | ICD-10-CM

## 2020-11-21 DIAGNOSIS — L821 Other seborrheic keratosis: Secondary | ICD-10-CM

## 2020-11-21 DIAGNOSIS — D239 Other benign neoplasm of skin, unspecified: Secondary | ICD-10-CM

## 2020-11-21 NOTE — Progress Notes (Signed)
   New Patient Visit  Subjective  Leslie Davidson is a 60 y.o. female who presents for the following: Annual Exam (Mole check ). Pt c/o pink spot on her nose.  The patient presents for Total-Body Skin Exam (TBSE) for skin cancer screening and mole check.   The following portions of the chart were reviewed this encounter and updated as appropriate:   Tobacco  Allergies  Meds  Problems  Med Hx  Surg Hx  Fam Hx     Review of Systems:  No other skin or systemic complaints except as noted in HPI or Assessment and Plan.  Objective  Well appearing patient in no apparent distress; mood and affect are within normal limits.  A full examination was performed including scalp, head, eyes, ears, nose, lips, neck, chest, axillae, abdomen, back, buttocks, bilateral upper extremities, bilateral lower extremities, hands, feet, fingers, toes, fingernails, and toenails. All findings within normal limits unless otherwise noted below.  right nose Dilated pore   Assessment & Plan  Dilated pore of Winer right nose  Benign-appearing.  Observation.  Call clinic for new or changing moles.  Recommend daily use of broad spectrum spf 30+ sunscreen to sun-exposed areas.    Skin cancer screening  Lentigines - Scattered tan macules - Due to sun exposure - Benign-appering, observe - Recommend daily broad spectrum sunscreen SPF 30+ to sun-exposed areas, reapply every 2 hours as needed. - Call for any changes  Seborrheic Keratoses - Stuck-on, waxy, tan-brown papules and/or plaques  - Benign-appearing - Discussed benign etiology and prognosis. - Observe - Call for any changes  Melanocytic Nevi - Tan-brown and/or pink-flesh-colored symmetric macules and papules - Benign appearing on exam today - Observation - Call clinic for new or changing moles - Recommend daily use of broad spectrum spf 30+ sunscreen to sun-exposed areas.   Hemangiomas - Red papules - Discussed benign nature - Observe - Call  for any changes  Actinic Damage - Chronic condition, secondary to cumulative UV/sun exposure - diffuse scaly erythematous macules with underlying dyspigmentation - Recommend daily broad spectrum sunscreen SPF 30+ to sun-exposed areas, reapply every 2 hours as needed.  - Staying in the shade or wearing long sleeves, sun glasses (UVA+UVB protection) and wide brim hats (4-inch brim around the entire circumference of the hat) are also recommended for sun protection.  - Call for new or changing lesions.  Skin cancer screening performed today.   Return in about 1 year (around 11/21/2021) for TBSE.  IMarye Round, CMA, am acting as scribe for Sarina Ser, MD .  Documentation: I have reviewed the above documentation for accuracy and completeness, and I agree with the above.  Sarina Ser, MD

## 2020-11-21 NOTE — Patient Instructions (Signed)

## 2020-12-04 ENCOUNTER — Ambulatory Visit (INDEPENDENT_AMBULATORY_CARE_PROVIDER_SITE_OTHER): Payer: BC Managed Care – PPO | Admitting: Internal Medicine

## 2020-12-04 ENCOUNTER — Other Ambulatory Visit: Payer: Self-pay

## 2020-12-04 VITALS — BP 120/70 | HR 68 | Temp 97.8°F | Resp 16 | Ht 63.0 in | Wt 97.4 lb

## 2020-12-04 DIAGNOSIS — E538 Deficiency of other specified B group vitamins: Secondary | ICD-10-CM | POA: Diagnosis not present

## 2020-12-04 DIAGNOSIS — Z23 Encounter for immunization: Secondary | ICD-10-CM

## 2020-12-04 DIAGNOSIS — R32 Unspecified urinary incontinence: Secondary | ICD-10-CM

## 2020-12-04 DIAGNOSIS — Z0000 Encounter for general adult medical examination without abnormal findings: Secondary | ICD-10-CM

## 2020-12-04 DIAGNOSIS — D696 Thrombocytopenia, unspecified: Secondary | ICD-10-CM | POA: Diagnosis not present

## 2020-12-04 DIAGNOSIS — Z1231 Encounter for screening mammogram for malignant neoplasm of breast: Secondary | ICD-10-CM

## 2020-12-04 DIAGNOSIS — E785 Hyperlipidemia, unspecified: Secondary | ICD-10-CM | POA: Diagnosis not present

## 2020-12-04 DIAGNOSIS — Z0001 Encounter for general adult medical examination with abnormal findings: Secondary | ICD-10-CM | POA: Diagnosis not present

## 2020-12-04 DIAGNOSIS — R1013 Epigastric pain: Secondary | ICD-10-CM | POA: Diagnosis not present

## 2020-12-04 DIAGNOSIS — K219 Gastro-esophageal reflux disease without esophagitis: Secondary | ICD-10-CM

## 2020-12-04 LAB — CBC WITH DIFFERENTIAL/PLATELET
Basophils Absolute: 0 10*3/uL (ref 0.0–0.1)
Basophils Relative: 0.9 % (ref 0.0–3.0)
Eosinophils Absolute: 0 10*3/uL (ref 0.0–0.7)
Eosinophils Relative: 1 % (ref 0.0–5.0)
HCT: 39.7 % (ref 36.0–46.0)
Hemoglobin: 13.2 g/dL (ref 12.0–15.0)
Lymphocytes Relative: 26.8 % (ref 12.0–46.0)
Lymphs Abs: 1.2 10*3/uL (ref 0.7–4.0)
MCHC: 33.2 g/dL (ref 30.0–36.0)
MCV: 102.9 fl — ABNORMAL HIGH (ref 78.0–100.0)
Monocytes Absolute: 0.4 10*3/uL (ref 0.1–1.0)
Monocytes Relative: 8.5 % (ref 3.0–12.0)
Neutro Abs: 2.8 10*3/uL (ref 1.4–7.7)
Neutrophils Relative %: 62.8 % (ref 43.0–77.0)
Platelets: 119 10*3/uL — ABNORMAL LOW (ref 150.0–400.0)
RBC: 3.86 Mil/uL — ABNORMAL LOW (ref 3.87–5.11)
RDW: 12.6 % (ref 11.5–15.5)
WBC: 4.5 10*3/uL (ref 4.0–10.5)

## 2020-12-04 LAB — VITAMIN B12: Vitamin B-12: 419 pg/mL (ref 211–911)

## 2020-12-04 LAB — BASIC METABOLIC PANEL
BUN: 21 mg/dL (ref 6–23)
CO2: 28 mEq/L (ref 19–32)
Calcium: 9.1 mg/dL (ref 8.4–10.5)
Chloride: 103 mEq/L (ref 96–112)
Creatinine, Ser: 0.74 mg/dL (ref 0.40–1.20)
GFR: 86.91 mL/min (ref >60.00)
Glucose, Bld: 85 mg/dL (ref 70–99)
Potassium: 4 mEq/L (ref 3.5–5.1)
Sodium: 138 mEq/L (ref 135–145)

## 2020-12-04 LAB — URINALYSIS, ROUTINE W REFLEX MICROSCOPIC
Bilirubin Urine: NEGATIVE
Hgb urine dipstick: NEGATIVE
Ketones, ur: NEGATIVE
Leukocytes,Ua: NEGATIVE
Nitrite: NEGATIVE
RBC / HPF: NONE SEEN (ref 0–?)
Specific Gravity, Urine: <=1.005 — AB (ref 1.000–1.030)
Total Protein, Urine: NEGATIVE
Urine Glucose: NEGATIVE
Urobilinogen, UA: 0.2 (ref 0.0–1.0)
pH: 5.5 (ref 5.0–8.0)

## 2020-12-04 LAB — LIPID PANEL
Cholesterol: 182 mg/dL (ref 0–200)
HDL: 77 mg/dL (ref >39.00)
LDL Cholesterol: 93 mg/dL (ref 0–99)
NonHDL: 104.93
Total CHOL/HDL Ratio: 2
Triglycerides: 62 mg/dL (ref 0.0–149.0)
VLDL: 12.4 mg/dL (ref 0.0–40.0)

## 2020-12-04 LAB — HEPATIC FUNCTION PANEL
ALT: 37 U/L — ABNORMAL HIGH (ref 0–35)
AST: 30 U/L (ref 0–37)
Albumin: 4 g/dL (ref 3.5–5.2)
Alkaline Phosphatase: 69 U/L (ref 39–117)
Bilirubin, Direct: 0.1 mg/dL (ref 0.0–0.3)
Total Bilirubin: 0.5 mg/dL (ref 0.2–1.2)
Total Protein: 6 g/dL (ref 6.0–8.3)

## 2020-12-04 LAB — LIPASE: Lipase: 24 U/L (ref 11.0–59.0)

## 2020-12-04 LAB — TSH: TSH: 1.09 u[IU]/mL (ref 0.35–5.50)

## 2020-12-04 NOTE — Assessment & Plan Note (Addendum)
Physical today 12/04/20.  PAP 09/06/18 - negative with negative HPV.  Need colonoscopy report - had 05/2019.  Schedule mammogram.

## 2020-12-04 NOTE — Progress Notes (Signed)
Patient ID: Leslie Davidson, female   DOB: Jul 29, 1960, 60 y.o.   MRN: [id number]   Subjective:    Patient ID: Leslie Davidson, female    DOB: [birth year], 60 y.o.   MRN: [id number]  This visit occurred during the SARS-CoV-2 public health emergency.  Safety protocols were in place, including screening questions prior to the visit, additional usage of staff PPE, and extensive cleaning of exam room while observing appropriate contact time as indicated for disinfecting solutions.   Patient here for physical exam.   Chief Complaint  Patient presents with   Annual Exam   .   HPI Reports she is doing relatively well.  Increased acid reflux and upper abdominal pain.  Intermittent flares.  Increased gas.  She has taken PPIs previously.  Off now.  No chest pain or sob.  Stays active.  No increased cough or congestion.  Some incontinence - SI.  No vaginal problems.  Bowels stable.  Handling stress.     Past Medical History:  Diagnosis Date   Arthritis    GERD (gastroesophageal reflux disease)    Past Surgical History:  Procedure Laterality Date   AUGMENTATION MAMMAPLASTY Bilateral    2000   BREAST ENHANCEMENT SURGERY  2000   Family History  Problem Relation Age of Onset   Diabetes Mother    Cancer Father        Lung cancer   Arthritis Maternal Grandmother    Breast cancer Neg Hx    Social History   Socioeconomic History   Marital status: Married    Spouse name: Not on file   Number of children: Not on file   Years of education: Not on file   Highest education level: Not on file  Occupational History   Not on file  Tobacco Use   Smoking status: Former    Types: Cigarettes    Quit date: 03/31/1983    Years since quitting: 37.7   Smokeless tobacco: Never  Substance and Sexual Activity   Alcohol use: Yes    Alcohol/week: 0.0 standard drinks    Comment: Occasional    Drug use: No   Sexual activity: Yes    Partners: Male    Birth control/protection: Post-menopausal    Comment: Husband   Other Topics Concern   Not on file  Social History Narrative   Retired from the school system    Lives with husband    Children- 55- son and 2 daughters    Pets: 1 dog lives inside    Caffeine- Coffee 3-4 cups, 1 cup diet pepsi   Social Determinants of Health   Financial Resource Strain: Not on file  Food Insecurity: Not on file  Transportation Needs: Not on file  Physical Activity: Not on file  Stress: Not on file  Social Connections: Not on file    Review of Systems  Constitutional:  Negative for fatigue and unexpected weight change.  HENT:  Negative for congestion, sinus pressure and sore throat.   Eyes:  Negative for pain and visual disturbance.  Respiratory:  Negative for cough, chest tightness and shortness of breath.   Cardiovascular:  Negative for chest pain, palpitations and leg swelling.  Gastrointestinal:  Negative for diarrhea, nausea and vomiting.       Upper abdominal pain - intermittent - as outlined.   Genitourinary:  Negative for difficulty urinating and dysuria.  Musculoskeletal:  Negative for joint swelling and myalgias.  Skin:  Negative for color change and rash.  Neurological:  Negative for dizziness, light-headedness and headaches.  Hematological:  Negative for adenopathy. Does not bruise/bleed easily.  Psychiatric/Behavioral:  Negative for agitation and dysphoric mood.       Objective:     BP 120/70   Pulse 68   Temp 97.8 F (36.6 C)   Resp 16   Ht '5\' 3"'$  (1.6 m)   Wt 97 lb 6.4 oz (44.2 kg)   LMP 01/02/2013   SpO2 99%   BMI 17.25 kg/m  Wt Readings from Last 3 Encounters:  12/04/20 97 lb 6.4 oz (44.2 kg)  06/03/20 99 lb (44.9 kg)  12/05/19 100 lb (45.4 kg)    Physical Exam Vitals reviewed.  Constitutional:      General: She is not in acute distress.    Appearance: Normal appearance. She is well-developed.  HENT:     Head: Normocephalic and atraumatic.     Right Ear: External ear normal.     Left Ear: External ear normal.  Eyes:      General: No scleral icterus.       Right eye: No discharge.        Left eye: No discharge.     Conjunctiva/sclera: Conjunctivae normal.  Neck:     Thyroid: No thyromegaly.  Cardiovascular:     Rate and Rhythm: Normal rate and regular rhythm.  Pulmonary:     Effort: No tachypnea, accessory muscle usage or respiratory distress.     Breath sounds: Normal breath sounds. No decreased breath sounds, wheezing or rhonchi.     Comments: Breasts:  implants.  Chest:  Breasts:    Right: No inverted nipple, mass, nipple discharge or tenderness (no axillary adenopathy).     Left: No inverted nipple, mass, nipple discharge or tenderness (no axilarry adenopathy).  Abdominal:     General: Bowel sounds are normal.     Palpations: Abdomen is soft.     Tenderness: There is no abdominal tenderness.  Musculoskeletal:        General: No swelling or tenderness.     Cervical back: Neck supple. No tenderness.  Lymphadenopathy:     Cervical: No cervical adenopathy.  Skin:    General: Skin is warm.     Findings: No erythema or rash.  Neurological:     Mental Status: She is alert and oriented to person, place, and time.  Psychiatric:        Mood and Affect: Mood normal.        Behavior: Behavior normal.     Outpatient Encounter Medications as of 12/04/2020  Medication Sig   Probiotic Product (PROBIOTIC PO) Take by mouth.   No facility-administered encounter medications on file as of 12/04/2020.     Lab Results  Component Value Date   WBC 4.5 12/04/2020   HGB 13.2 12/04/2020   HCT 39.7 12/04/2020   PLT 119.0 (L) 12/04/2020   GLUCOSE 85 12/04/2020   CHOL 182 12/04/2020   TRIG 62.0 12/04/2020   HDL 77.00 12/04/2020   LDLCALC 93 12/04/2020   ALT 37 (H) 12/04/2020   AST 30 12/04/2020   NA 138 12/04/2020   K 4.0 12/04/2020   CL 103 12/04/2020   CREATININE 0.74 12/04/2020   BUN 21 12/04/2020   CO2 28 12/04/2020   TSH 1.09 12/04/2020   HGBA1C 5.9 09/06/2018    US BREAST LTD UNI LEFT INC  AXILLA  Result Date: 12/11/2019 CLINICAL DATA:  60 year old female recalled from screening mammogram dated 11/27/2019 for a possible left breast mass. EXAM: DIGITAL  DIAGNOSTIC LEFT MAMMOGRAM WITH IMPLANTS, CAD AND TOMO ULTRASOUND LEFT BREAST The patient has retropectoral implants. Standard and implant displaced views were performed. COMPARISON:  Previous exam(s). ACR Breast Density Category b: There are scattered areas of fibroglandular density. FINDINGS: There is a persistent oval, circumscribed equal density mass in the upper inner quadrant of the left breast at middle depth. Further evaluation with ultrasound was performed. Mammographic images were processed with CAD. Targeted ultrasound is performed, showing an oval, circumscribed completely anechoic mass at the 11 o'clock position 5 cm from the nipple. It measures 4 x 4 x 3 mm. This correlates well with the mammographic finding and is consistent with a benign simple cyst. IMPRESSION: Benign left breast simple cyst corresponding with the screening mammographic findings. No further imaging follow-up required. RECOMMENDATION: Screening mammogram in one year.(Code:SM-B-01Y) I have discussed the findings and recommendations with the patient. If applicable, a reminder letter will be sent to the patient regarding the next appointment. BI-RADS CATEGORY  2: Benign. Electronically Signed   By: Kristopher Oppenheim M.D.   On: 12/11/2019 11:31   MM DIAG BREAST W/IMPLANT TOMO UNI L  Result Date: 12/11/2019 CLINICAL DATA:  60 year old female recalled from screening mammogram dated 11/27/2019 for a possible left breast mass. EXAM: DIGITAL DIAGNOSTIC LEFT MAMMOGRAM WITH IMPLANTS, CAD AND TOMO ULTRASOUND LEFT BREAST The patient has retropectoral implants. Standard and implant displaced views were performed. COMPARISON:  Previous exam(s). ACR Breast Density Category b: There are scattered areas of fibroglandular density. FINDINGS: There is a persistent oval, circumscribed equal  density mass in the upper inner quadrant of the left breast at middle depth. Further evaluation with ultrasound was performed. Mammographic images were processed with CAD. Targeted ultrasound is performed, showing an oval, circumscribed completely anechoic mass at the 11 o'clock position 5 cm from the nipple. It measures 4 x 4 x 3 mm. This correlates well with the mammographic finding and is consistent with a benign simple cyst. IMPRESSION: Benign left breast simple cyst corresponding with the screening mammographic findings. No further imaging follow-up required. RECOMMENDATION: Screening mammogram in one year.(Code:SM-B-01Y) I have discussed the findings and recommendations with the patient. If applicable, a reminder letter will be sent to the patient regarding the next appointment. BI-RADS CATEGORY  2: Benign. Electronically Signed   By: Kristopher Oppenheim M.D.   On: 12/11/2019 11:31       Assessment & Plan:   Problem List Items Addressed This Visit     Abdominal pain, epigastric    Persistent intermittent abdominal pain/epigastric pain.  Acid reflux as outlined.  Start aciphex.  Check cbc, metabolic panel and liver function tests.  Also check abdominal ultrasound.       Relevant Orders   Hepatic function panel (Completed)   Basic metabolic panel (Completed)   Lipase (Completed)   US Abdomen Complete   Ambulatory referral to Gastroenterology   B12 deficiency      Check B12 level today.       Relevant Orders   Vitamin B12 (Completed)   GERD (gastroesophageal reflux disease)    Acid reflux.  Restart PPI.  Start aciphex.  Follow.  Given return of symptoms off medication, refer to GI for evaluation and question of need for EGD.       Relevant Orders   Ambulatory referral to Gastroenterology   Healthcare maintenance    Physical today 12/04/20.  PAP 09/06/18 - negative with negative HPV.  Need colonoscopy report - had 05/2019.  Schedule mammogram.  Hyperlipidemia    Follow lipid panel.        Relevant Orders   TSH   Lipid panel (Completed)   Incontinence    Discussed SI.  Discussed kegel exercises, pelvic floor therapy.  Check urine.        Relevant Orders   Urinalysis, Routine w reflex microscopic (Completed)   Thrombocytopenia (HCC)    Follow cbc.       Relevant Orders   TSH (Completed)   CBC with Differential/Platelet (Completed)   Other Visit Diagnoses     Routine general medical examination at a health care facility    -  Primary   Visit for screening mammogram       Relevant Orders   MM 3D SCREEN BREAST BILATERAL   MM 3D SCREEN BREAST W/IMPLANT BILATERAL   Need for immunization against influenza       Relevant Orders   Flu Vaccine QUAD 65moIM (Fluarix, Fluzone & Alfiuria Quad PF) (Completed)        CEinar Pheasant MD

## 2020-12-05 ENCOUNTER — Other Ambulatory Visit: Payer: Self-pay | Admitting: Internal Medicine

## 2020-12-05 ENCOUNTER — Telehealth: Payer: Self-pay | Admitting: Internal Medicine

## 2020-12-05 DIAGNOSIS — R945 Abnormal results of liver function studies: Secondary | ICD-10-CM

## 2020-12-05 DIAGNOSIS — R7989 Other specified abnormal findings of blood chemistry: Secondary | ICD-10-CM

## 2020-12-05 DIAGNOSIS — D696 Thrombocytopenia, unspecified: Secondary | ICD-10-CM

## 2020-12-05 MED ORDER — RABEPRAZOLE SODIUM 20 MG PO TBEC: 20.0000 mg | DELAYED_RELEASE_TABLET | Freq: Every day | ORAL | 2 refills | Status: DC

## 2020-12-05 NOTE — Telephone Encounter (Signed)
Patient was seen yesterday by Dr.Scott and went to the pharmacy to pick up her new medicine but they did not have a prescription for her and she is unsure the name of the medicine.Please advise.

## 2020-12-05 NOTE — Telephone Encounter (Signed)
I think we discussed you starting her on aciphex?

## 2020-12-05 NOTE — Telephone Encounter (Signed)
Patient calling in and informed

## 2020-12-05 NOTE — Telephone Encounter (Signed)
Rx sent in for aciphex.  Sorry for the inconvenience.

## 2020-12-05 NOTE — Progress Notes (Signed)
Order placed for f/u labs.  

## 2020-12-05 NOTE — Telephone Encounter (Signed)
LMTCB

## 2020-12-08 ENCOUNTER — Encounter: Payer: Self-pay | Admitting: Internal Medicine

## 2020-12-08 DIAGNOSIS — R32 Unspecified urinary incontinence: Secondary | ICD-10-CM | POA: Insufficient documentation

## 2020-12-08 NOTE — Assessment & Plan Note (Signed)
Follow cbc.  

## 2020-12-08 NOTE — Assessment & Plan Note (Signed)
Acid reflux.  Restart PPI.  Start aciphex.  Follow.  Given return of symptoms off medication, refer to GI for evaluation and question of need for EGD.

## 2020-12-08 NOTE — Assessment & Plan Note (Signed)
Check B12 level today.  

## 2020-12-08 NOTE — Assessment & Plan Note (Signed)
Discussed SI.  Discussed kegel exercises, pelvic floor therapy.  Check urine.

## 2020-12-08 NOTE — Assessment & Plan Note (Signed)
Persistent intermittent abdominal pain/epigastric pain.  Acid reflux as outlined.  Start aciphex.  Check cbc, metabolic panel and liver function tests.  Also check abdominal ultrasound.

## 2020-12-08 NOTE — Assessment & Plan Note (Signed)
Follow lipid panel.   

## 2020-12-09 ENCOUNTER — Telehealth: Payer: Self-pay | Admitting: Internal Medicine

## 2020-12-09 NOTE — Telephone Encounter (Signed)
Patient is returning your call to schedule an ultrasound,please advise.

## 2020-12-09 NOTE — Telephone Encounter (Signed)
Lft pt vm to call ofc to sch US abdomen. Thank you!

## 2020-12-10 NOTE — Telephone Encounter (Signed)
I informed the patient you would return her call.

## 2020-12-17 ENCOUNTER — Ambulatory Visit
Admission: RE | Admit: 2020-12-17 | Discharge: 2020-12-17 | Disposition: A | Discharge status: Home / Self Care | Payer: BC Managed Care – PPO | Source: Ambulatory Visit | Attending: Internal Medicine | Admitting: Internal Medicine

## 2020-12-17 ENCOUNTER — Other Ambulatory Visit: Payer: Self-pay

## 2020-12-17 DIAGNOSIS — R1013 Epigastric pain: Secondary | ICD-10-CM | POA: Insufficient documentation

## 2020-12-17 DIAGNOSIS — K219 Gastro-esophageal reflux disease without esophagitis: Secondary | ICD-10-CM | POA: Diagnosis not present

## 2020-12-19 NOTE — Progress Notes (Signed)
Patient notified and voiced understanding.

## 2020-12-20 ENCOUNTER — Other Ambulatory Visit: Payer: Self-pay

## 2020-12-20 ENCOUNTER — Ambulatory Visit (INDEPENDENT_AMBULATORY_CARE_PROVIDER_SITE_OTHER): Payer: BC Managed Care – PPO

## 2020-12-20 DIAGNOSIS — R7989 Other specified abnormal findings of blood chemistry: Secondary | ICD-10-CM

## 2020-12-20 DIAGNOSIS — E538 Deficiency of other specified B group vitamins: Secondary | ICD-10-CM

## 2020-12-20 DIAGNOSIS — R945 Abnormal results of liver function studies: Secondary | ICD-10-CM

## 2020-12-20 DIAGNOSIS — D696 Thrombocytopenia, unspecified: Secondary | ICD-10-CM

## 2020-12-20 LAB — CBC WITH DIFFERENTIAL/PLATELET
Basophils Absolute: 0 10*3/uL (ref 0.0–0.1)
Basophils Relative: 0.9 % (ref 0.0–3.0)
Eosinophils Absolute: 0 10*3/uL (ref 0.0–0.7)
Eosinophils Relative: 1.2 % (ref 0.0–5.0)
HCT: 41.1 % (ref 36.0–46.0)
Hemoglobin: 13.6 g/dL (ref 12.0–15.0)
Lymphocytes Relative: 36.7 % (ref 12.0–46.0)
Lymphs Abs: 1.4 10*3/uL (ref 0.7–4.0)
MCHC: 33.1 g/dL (ref 30.0–36.0)
MCV: 101.8 fl — ABNORMAL HIGH (ref 78.0–100.0)
Monocytes Absolute: 0.3 10*3/uL (ref 0.1–1.0)
Monocytes Relative: 8.1 % (ref 3.0–12.0)
Neutro Abs: 2.1 10*3/uL (ref 1.4–7.7)
Neutrophils Relative %: 53.1 % (ref 43.0–77.0)
Platelets: 127 10*3/uL — ABNORMAL LOW (ref 150.0–400.0)
RBC: 4.04 Mil/uL (ref 3.87–5.11)
RDW: 12.5 % (ref 11.5–15.5)
WBC: 3.9 10*3/uL — ABNORMAL LOW (ref 4.0–10.5)

## 2020-12-20 LAB — HEPATIC FUNCTION PANEL
ALT: 30 U/L (ref 0–35)
AST: 30 U/L (ref 0–37)
Albumin: 4.4 g/dL (ref 3.5–5.2)
Alkaline Phosphatase: 66 U/L (ref 39–117)
Bilirubin, Direct: 0.1 mg/dL (ref 0.0–0.3)
Total Bilirubin: 0.7 mg/dL (ref 0.2–1.2)
Total Protein: 6.5 g/dL (ref 6.0–8.3)

## 2020-12-20 MED ORDER — CYANOCOBALAMIN 1000 MCG/ML IJ SOLN
1000.0000 ug | Freq: Once | INTRAMUSCULAR | Status: AC
Start: 2020-12-20 — End: 2020-12-20
  Administered 2020-12-20: 1000 ug via INTRAMUSCULAR

## 2020-12-20 NOTE — Progress Notes (Signed)
Patient presented for B 12 injection to left deltoid, patient voiced no concerns nor showed any signs of distress during injection. 

## 2020-12-24 LAB — FOLATE RBC: RBC Folate: 476 ng/mL RBC (ref >280)

## 2020-12-26 ENCOUNTER — Telehealth: Payer: Self-pay | Admitting: Internal Medicine

## 2020-12-26 NOTE — Telephone Encounter (Signed)
See lab result note.

## 2020-12-27 ENCOUNTER — Other Ambulatory Visit: Payer: Self-pay | Admitting: Internal Medicine

## 2020-12-27 ENCOUNTER — Encounter: Payer: Self-pay | Admitting: Internal Medicine

## 2020-12-27 DIAGNOSIS — D696 Thrombocytopenia, unspecified: Secondary | ICD-10-CM

## 2020-12-27 NOTE — Progress Notes (Signed)
Order placed for hematology referral.  

## 2021-01-15 ENCOUNTER — Inpatient Hospital Stay: Payer: BC Managed Care – PPO | Attending: Internal Medicine | Admitting: Internal Medicine

## 2021-01-15 ENCOUNTER — Encounter: Payer: Self-pay | Admitting: Internal Medicine

## 2021-01-15 ENCOUNTER — Other Ambulatory Visit: Payer: Self-pay

## 2021-01-15 ENCOUNTER — Inpatient Hospital Stay: Payer: BC Managed Care – PPO

## 2021-01-15 VITALS — BP 114/81 | HR 71 | Temp 97.0°F | Resp 18 | Wt 97.0 lb

## 2021-01-15 DIAGNOSIS — D7589 Other specified diseases of blood and blood-forming organs: Secondary | ICD-10-CM | POA: Insufficient documentation

## 2021-01-15 DIAGNOSIS — D696 Thrombocytopenia, unspecified: Secondary | ICD-10-CM

## 2021-01-15 DIAGNOSIS — Z87891 Personal history of nicotine dependence: Secondary | ICD-10-CM | POA: Insufficient documentation

## 2021-01-15 DIAGNOSIS — R87619 Unspecified abnormal cytological findings in specimens from cervix uteri: Secondary | ICD-10-CM | POA: Insufficient documentation

## 2021-01-15 DIAGNOSIS — IMO0002 Reserved for concepts with insufficient information to code with codable children: Secondary | ICD-10-CM | POA: Insufficient documentation

## 2021-01-15 DIAGNOSIS — F3289 Other specified depressive episodes: Secondary | ICD-10-CM | POA: Insufficient documentation

## 2021-01-15 LAB — CBC WITH DIFFERENTIAL/PLATELET
Abs Immature Granulocytes: 0.02 10*3/uL (ref 0.00–0.07)
Basophils Absolute: 0 10*3/uL (ref 0.0–0.1)
Basophils Relative: 1 %
Eosinophils Absolute: 0 10*3/uL (ref 0.0–0.5)
Eosinophils Relative: 1 %
HCT: 44.6 % (ref 36.0–46.0)
Hemoglobin: 15 g/dL (ref 12.0–15.0)
Immature Granulocytes: 0 %
Lymphocytes Relative: 27 %
Lymphs Abs: 1.5 10*3/uL (ref 0.7–4.0)
MCH: 33.9 pg (ref 26.0–34.0)
MCHC: 33.6 g/dL (ref 30.0–36.0)
MCV: 100.7 fL — ABNORMAL HIGH (ref 80.0–100.0)
Monocytes Absolute: 0.4 10*3/uL (ref 0.1–1.0)
Monocytes Relative: 6 %
Neutro Abs: 3.6 10*3/uL (ref 1.7–7.7)
Neutrophils Relative %: 65 %
Platelets: 185 10*3/uL (ref 150–400)
RBC: 4.43 MIL/uL (ref 3.87–5.11)
RDW: 12.3 % (ref 11.5–15.5)
WBC: 5.5 10*3/uL (ref 4.0–10.5)
nRBC: 0 % (ref 0.0–0.2)

## 2021-01-15 LAB — HEPATITIS C ANTIBODY: HCV Ab: NONREACTIVE

## 2021-01-15 LAB — RETICULOCYTES
Immature Retic Fract: 6.5 % (ref 2.3–15.9)
RBC.: 4.35 MIL/uL (ref 3.87–5.11)
Retic Count, Absolute: 53.9 10*3/uL (ref 19.0–186.0)
Retic Ct Pct: 1.2 % (ref 0.4–3.1)

## 2021-01-15 LAB — HEPATITIS B SURFACE ANTIGEN: Hepatitis B Surface Ag: NONREACTIVE

## 2021-01-15 LAB — TECHNOLOGIST SMEAR REVIEW: Plt Morphology: ADEQUATE

## 2021-01-15 LAB — HIV ANTIBODY (ROUTINE TESTING W REFLEX): HIV Screen 4th Generation wRfx: NONREACTIVE

## 2021-01-15 LAB — LACTATE DEHYDROGENASE: LDH: 167 U/L (ref 98–192)

## 2021-01-15 NOTE — Assessment & Plan Note (Addendum)
#  Thrombocytopenia: Chronic [2016]-mild.  100,000.  The etiology is unclear suspect ITP versus other causes.  Recent ultrasound negative for any liver diseases.  No significant alcohol intake as per patient.  #Mild macrocytosis MCV 102; recent B12 normal.  No liver disease noted on the ultrasound.  Monitor for now.   #Recommend checking CBC; review of smear; LDH; HIV/hepatitis.  Discussed the role of bone marrow biopsy in evaluation of hematologic disorders.  Hold off bone marrow biopsy at this time given patient's mild thrombocytopenia/chronicity.  Await above work-up.  Patient agreement.  Thank you Dr. Nicki Reaper for allowing me to participate in the care of your pleasant patient. Please do not hesitate to contact me with questions or concerns in the interim.  # DISPOSITION: # labs today # follow up in 2-3 weeks; MD vitual visit- Dr.B

## 2021-01-15 NOTE — Progress Notes (Signed)
[facility] CONSULT NOTE  Patient Care Team: Einar Pheasant, MD as PCP - General (Internal Medicine) Minna Merritts, MD as Consulting Physician (Cardiology)  CHIEF COMPLAINTS/PURPOSE OF CONSULTATION: Thrombocytosis.   HEMATOLOGY HISTORY  # THROMBOCYTOPENIA  [2016- platelets-120s-130s;  Hb-13; MCV 102; white count-WNL]; OCT 2022- [place]- NEG   HISTORY OF PRESENTING ILLNESS: Alone.  Ambulating independently.  Leslie Davidson 60 y.o.  female pleasant patient was been referred to [place] for further evaluation of low platelets which was incidentally found on blood work.   Patient denies history of any easy bruising or bleeding.  Patient denies any weight loss or night sweats or recurrent fevers.  No cough or shortness of breath or chest pain.  Not on blood thinners.  No new medications.  Alcohol: Rare   Review of Systems  Constitutional:  Negative for chills, diaphoresis, fever, malaise/fatigue and weight loss.  HENT:  Negative for nosebleeds and sore throat.   Eyes:  Negative for double vision.  Respiratory:  Negative for cough, hemoptysis, sputum production, shortness of breath and wheezing.   Cardiovascular:  Negative for chest pain, palpitations, orthopnea and leg swelling.  Gastrointestinal:  Negative for abdominal pain, blood in stool, constipation, diarrhea, heartburn, melena, nausea and vomiting.  Genitourinary:  Negative for dysuria, frequency and urgency.  Musculoskeletal:  Negative for back pain and joint pain.  Skin: Negative.  Negative for itching and rash.  Neurological:  Negative for dizziness, tingling, focal weakness, weakness and headaches.  Endo/Heme/Allergies:  Does not bruise/bleed easily.  Psychiatric/Behavioral:  Negative for depression. The patient is not nervous/anxious and does not have insomnia.     MEDICAL HISTORY:  Past Medical History:  Diagnosis Date   Arthritis    GERD (gastroesophageal reflux disease)     SURGICAL HISTORY: Past  Surgical History:  Procedure Laterality Date   AUGMENTATION MAMMAPLASTY Bilateral    2000   BREAST ENHANCEMENT SURGERY  2000    SOCIAL HISTORY: Social History   Socioeconomic History   Marital status: Married    Spouse name: Not on file   Number of children: Not on file   Years of education: Not on file   Highest education level: Not on file  Occupational History   Not on file  Tobacco Use   Smoking status: Former    Types: Cigarettes    Quit date: 03/31/1983    Years since quitting: 37.8   Smokeless tobacco: Never  Substance and Sexual Activity   Alcohol use: Yes    Alcohol/week: 0.0 standard drinks    Comment: Occasional    Drug use: No   Sexual activity: Yes    Partners: Male    Birth control/protection: Post-menopausal    Comment: Husband  Other Topics Concern   Not on file  Social History Narrative   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      Quit smoking in 1985. Rare alcohol.    Social Determinants of Health   Financial Resource Strain: Not on file  Food Insecurity: Not on file  Transportation Needs: Not on file  Physical Activity: Not on file  Stress: Not on file  Social Connections: Not on file  Intimate Partner Violence: Not on file    FAMILY HISTORY: Family History  Problem Relation Age of Onset   Diabetes Mother    Cancer Father  Lung cancer   Arthritis Maternal Grandmother    Breast cancer Neg Hx     ALLERGIES:  has no active allergies.  MEDICATIONS:  Current Outpatient Medications  Medication Sig Dispense Refill   COLLAGEN PO Take by mouth.     RABEprazole (ACIPHEX) 20 MG tablet Take 1 tablet (20 mg total) by mouth daily. (Patient not taking: Reported on 01/15/2021) 30 tablet 2   No current facility-administered medications for this visit.     PHYSICAL EXAMINATION:   Vitals:    01/15/21 1127  BP: 114/81  Pulse: 71  Resp: 18  Temp: (!) 97 F (36.1 C)  SpO2: 100%   Filed Weights   01/15/21 1127  Weight: 97 lb (44 kg)    Physical Exam Vitals and nursing note reviewed.  HENT:     Head: Normocephalic and atraumatic.     Mouth/Throat:     Pharynx: Oropharynx is clear.  Eyes:     Extraocular Movements: Extraocular movements intact.     Pupils: Pupils are equal, round, and reactive to light.  Cardiovascular:     Rate and Rhythm: Normal rate and regular rhythm.  Pulmonary:     Effort: Pulmonary effort is normal.     Breath sounds: Normal breath sounds.  Abdominal:     Palpations: Abdomen is soft.  Musculoskeletal:        General: Normal range of motion.     Cervical back: Normal range of motion.  Skin:    General: Skin is warm.  Neurological:     General: No focal deficit present.     Mental Status: She is alert and oriented to person, place, and time.  Psychiatric:        Behavior: Behavior normal.        Judgment: Judgment normal.     LABORATORY DATA:  I have reviewed the data as listed Lab Results  Component Value Date   WBC 5.5 01/15/2021   HGB 15.0 01/15/2021   HCT 44.6 01/15/2021   MCV 100.7 (H) 01/15/2021   PLT 185 01/15/2021   Recent Labs    02/27/20 0825 12/04/20 1100 12/20/20 1001  NA 135 138  --   K 4.3 4.0  --   CL 100 103  --   CO2 27 28  --   GLUCOSE 80 85  --   BUN 16 21  --   CREATININE 0.80 0.74  --   CALCIUM 9.2 9.1  --   PROT 6.7 6.0 6.5  ALBUMIN 4.4 4.0 4.4  AST 24 30 30  ALT 21 37* 30  ALKPHOS 68 69 66  BILITOT 0.6 0.5 0.7  BILIDIR  --  0.1 0.1     US Abdomen Complete  Result Date: 12/18/2020 CLINICAL DATA:  Chronic epigastric pain and GERD. EXAM: ABDOMEN ULTRASOUND COMPLETE COMPARISON:  Ultrasound February 01, 2012 FINDINGS: Gallbladder: No gallstones or wall thickening visualized. No sonographic Murphy sign noted by sonographer. Common bile duct: Diameter: 4 mm Liver: No focal lesion identified.  Within normal limits in parenchymal echogenicity. Portal vein is patent on color Doppler imaging with normal direction of blood flow towards the liver. IVC: No abnormality visualized. Pancreas: Visualized portion unremarkable. Spleen: Size and appearance within normal limits. Right Kidney: Length: 9.8 cm. Echogenicity within normal limits. No mass or hydronephrosis visualized. Left Kidney: Length: 10.7 cm. Echogenicity within normal limits. No mass or hydronephrosis visualized. Abdominal aorta: No aneurysm visualized. Other findings: None. IMPRESSION: Unremarkable ultrasound of the abdomen. Electronically Signed     By: Jeffrey  Waltz M.D.   On: 12/18/2020 09:27    ASSESSMENT & PLAN:   Thrombocytopenia (HCC) #Thrombocytopenia: Chronic [2016]-mild.  100,000.  The etiology is unclear suspect ITP versus other causes.  Recent ultrasound negative for any liver diseases.  No significant alcohol intake as per patient.  #Mild macrocytosis MCV 102; recent B12 normal.  No liver disease noted on the ultrasound.  Monitor for now.   #Recommend checking CBC; review of smear; LDH; HIV/hepatitis.  Discussed the role of bone marrow biopsy in evaluation of hematologic disorders.  Hold off bone marrow biopsy at this time given patient's mild thrombocytopenia/chronicity.  Await above work-up.  Patient agreement.  Thank you Dr. Scott for allowing me to participate in the care of your pleasant patient. Please do not hesitate to contact me with questions or concerns in the interim.  # DISPOSITION: # labs today # follow up in 2-3 weeks; MD vitual visit- Dr.B     Govinda R Brahmanday, MD 01/15/2021 2:47 PM   

## 2021-01-21 ENCOUNTER — Other Ambulatory Visit: Payer: Self-pay

## 2021-01-21 ENCOUNTER — Ambulatory Visit (INDEPENDENT_AMBULATORY_CARE_PROVIDER_SITE_OTHER): Payer: BC Managed Care – PPO

## 2021-01-21 DIAGNOSIS — E538 Deficiency of other specified B group vitamins: Secondary | ICD-10-CM

## 2021-01-21 MED ORDER — CYANOCOBALAMIN 1000 MCG/ML IJ SOLN
1000.0000 ug | Freq: Once | INTRAMUSCULAR | Status: AC
  Administered 2021-01-21: 1000 ug via INTRAMUSCULAR

## 2021-01-21 MED ORDER — CYANOCOBALAMIN 1000 MCG/ML IJ SOLN: 1000.0000 ug | Freq: Once | INTRAMUSCULAR | Status: DC

## 2021-01-21 NOTE — Progress Notes (Signed)
Patient presented for B 12 injection to right deltoid, patient voiced no concerns nor showed any signs of distress during injection. 

## 2021-01-31 ENCOUNTER — Inpatient Hospital Stay: Payer: BC Managed Care – PPO | Attending: Internal Medicine | Admitting: Internal Medicine

## 2021-01-31 DIAGNOSIS — D696 Thrombocytopenia, unspecified: Secondary | ICD-10-CM

## 2021-01-31 NOTE — Progress Notes (Signed)
[facility] CONSULT NOTE  Patient Care Team: Einar Pheasant, MD as PCP - General (Internal Medicine) Minna Merritts, MD as Consulting Physician (Cardiology)  CHIEF COMPLAINTS/PURPOSE OF CONSULTATION: Thrombocytosis.   HEMATOLOGY HISTORY  # THROMBOCYTOPENIA  [2016- platelets-120s-130s;  Hb-13; MCV 102; white count-WNL]; OCT 2022- [place]- NEG   HISTORY OF PRESENTING ILLNESS: Leslie Davidson 60 y.o.  female pleasant patient is here today with results of her blood work ordered follow-up thrombocytopenia.  Denies any new symptoms. No new medications. [facility] CONSULT NOTE  Patient Care Team: Einar Pheasant, MD as PCP - General (Internal Medicine) Minna Merritts, MD as Consulting Physician (Cardiology)  CHIEF COMPLAINTS/PURPOSE OF CONSULTATION: DVT/PE  #  Oncology History   No history exists.     HISTORY OF PRESENTING ILLNESS:  Leslie Davidson 60 y.o.  female   With regards risk factors: Long distance travel- Immobilization/trauma:  Previous history of DVT/PE:  Family history:  Birth control pills:   ROS   MEDICAL HISTORY:  Past Medical History:  Diagnosis Date   Arthritis    GERD (gastroesophageal reflux disease)     SURGICAL HISTORY: Past Surgical History:  Procedure Laterality Date   AUGMENTATION MAMMAPLASTY Bilateral    2000   BREAST ENHANCEMENT SURGERY  2000    SOCIAL HISTORY: Social History   Socioeconomic History   Marital status: Married    Spouse name: Not on file   Number of children: Not on file   Years of education: Not on file   Highest education level: Not on file  Occupational History   Not on file  Tobacco Use   Smoking status: Former    Types: Cigarettes    Quit date: 03/31/1983    Years since quitting: 37.8   Smokeless tobacco: Never  Substance and Sexual Activity   Alcohol use: Yes    Alcohol/week: 0.0 standard drinks    Comment: Occasional    Drug use: No   Sexual activity: Yes    Partners:  Male    Birth control/protection: Post-menopausal    Comment: Husband  Other Topics Concern   Not on file  Social History Narrative   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      Quit smoking in 1985. Rare alcohol.    Social Determinants of Health   Financial Resource Strain: Not on file  Food Insecurity: Not on file  Transportation Needs: Not on file  Physical Activity: Not on file  Stress: Not on file  Social Connections: Not on file  Intimate Partner Violence: Not on file    FAMILY HISTORY: Family History  Problem Relation Age of Onset   Diabetes Mother    Cancer Father        Lung cancer   Arthritis Maternal Grandmother    Breast cancer Neg Hx     ALLERGIES:  has no active allergies.  MEDICATIONS:  Current Outpatient Medications  Medication Sig Dispense Refill   COLLAGEN PO Take by mouth.     RABEprazole (ACIPHEX) 20 MG tablet Take 1 tablet (20 mg total) by mouth daily. (Patient not taking: No sig reported) 30 tablet 2   No current facility-administered medications for this visit.       PHYSICAL EXAMINATION:  There were no vitals filed for this visit. There were no vitals filed for  this visit.  Physical Exam   LABORATORY DATA:  I have reviewed the data as listed Lab Results  Component Value Date   WBC 5.5 01/15/2021   HGB 15.0 01/15/2021   HCT 44.6 01/15/2021   MCV 100.7 (H) 01/15/2021   PLT 185 01/15/2021   Recent Labs    02/27/20 0825 12/04/20 1100 12/20/20 1001  NA 135 138  --   K 4.3 4.0  --   CL 100 103  --   CO2 27 28  --   GLUCOSE 80 85  --   BUN 16 21  --   CREATININE 0.80 0.74  --   CALCIUM 9.2 9.1  --   PROT 6.7 6.0 6.5  ALBUMIN 4.4 4.0 4.4  AST '24 30 30  ' ALT 21 37* 30  ALKPHOS 68 69 66  BILITOT 0.6 0.5 0.7  BILIDIR  --  0.1 0.1    RADIOGRAPHIC STUDIES: I have personally  reviewed the radiological images as listed and agreed with the findings in the report. No results found.  ASSESSMENT & PLAN:   Thrombocytopenia ([facility]) #Thrombocytopenia: Chronic [2016]-mild.  100,000.  The etiology is unclear suspect ITP versus other causes.  Recent ultrasound negative for any liver diseases.  No significant alcohol intake as per patient.  #Mild macrocytosis MCV 102; recent B12 normal.  No liver disease noted on the ultrasound.  Monitor for now.   #Recommend checking CBC; review of smear; LDH; HIV/hepatitis.  Discussed the role of bone marrow biopsy in evaluation of hematologic disorders.  Hold off bone marrow biopsy at this time given patient's mild thrombocytopenia/chronicity.  Await above work-up.  Patient agreement.    # DISPOSITION: # follow up as needed Dr.B   All questions were answered. The patient knows to call the clinic with any problems, questions or concerns.       Cammie Sickle, MD 01/31/2021 3:52 PM        MEDICATIONS:  Current Outpatient Medications  Medication Sig Dispense Refill   COLLAGEN PO Take by mouth.     RABEprazole (ACIPHEX) 20 MG tablet Take 1 tablet (20 mg total) by mouth daily. (Patient not taking: No sig reported) 30 tablet 2   No current facility-administered medications for this visit.     LABORATORY DATA:  I have reviewed the data as listed Lab Results  Component Value Date   WBC 5.5 01/15/2021   HGB 15.0 01/15/2021   HCT 44.6 01/15/2021   MCV 100.7 (H) 01/15/2021   PLT 185 01/15/2021   Recent Labs    02/27/20 0825 12/04/20 1100 12/20/20 1001  NA 135 138  --   K 4.3 4.0  --   CL 100 103  --   CO2 27 28  --   GLUCOSE 80 85  --   BUN 16 21  --   CREATININE 0.80 0.74  --   CALCIUM 9.2 9.1  --   PROT 6.7 6.0 6.5  ALBUMIN 4.4 4.0 4.4  AST '24 30 30  ' ALT 21 37* 30  ALKPHOS 68 69 66  BILITOT 0.6 0.5 0.7  BILIDIR  --  0.1 0.1     No results found.  ASSESSMENT & PLAN:   Thrombocytopenia  ([facility]) #Thrombocytopenia: Chronic [2016]-mild.  100,000.  The etiology is unclear suspect ITP versus other causes.  Recent ultrasound negative for any liver diseases.  No significant alcohol intake as per patient.  #Mild macrocytosis MCV 102; recent B12 normal.  No liver disease noted on the  ultrasound.  Monitor for now.   #Recommend checking CBC; review of smear; LDH; HIV/hepatitis.  Discussed the role of bone marrow biopsy in evaluation of hematologic disorders.  Hold off bone marrow biopsy at this time given patient's mild thrombocytopenia/chronicity.  Await above work-up.  Patient agreement.    # DISPOSITION: # follow up as needed Dr.B     Cammie Sickle, MD 01/31/2021 3:50 PM

## 2021-01-31 NOTE — Assessment & Plan Note (Addendum)
#  Thrombocytopenia: Chronic [2016]-mild.  100,000.  Recent ultrasound negative for any liver diseases.  Labs October 2022 platelets 187; normal.  Hepatitis HIV peripheral smear normal.  I suspect patient has ITP.  This does not have to be treated on this patient's platelets are less than 50/starts bleeding.   #Mild macrocytosis MCV 102; recent B12 normal.  No liver disease noted on the ultrasound.  Clinically insignificant.  Would not recommend any bone marrow biopsy for further evaluation.  #Since patient is clinically stable I think is reasonable for the patient to follow-up with PCP/can follow-up with [place] as needed.  Patient comfortable with the plan; to call us if any questions or concerns in the interim.   # DISPOSITION: # follow up as needed Dr.B

## 2021-01-31 NOTE — Progress Notes (Signed)
[facility] NOTE  Patient Care Team: Einar Pheasant, MD as PCP - General (Internal Medicine) Rockey Situ Kathlene November, MD as Consulting Physician (Cardiology)  CHIEF COMPLAINTS/PURPOSE OF CONSULTATION: Thrombocytopenia    connected with Nile Riggs on 01/31/21 at  3:30 PM EST by video enabled telemedicine visit and verified that I am speaking with the correct person using two identifiers.  I discussed the limitations, risks, security and privacy concerns of performing an evaluation and management service by telemedicine and the availability of in-person appointments. I also discussed with the patient that there may be a patient responsible charge related to this service. The patient expressed understanding and agreed to proceed.    Other persons participating in the visit and their role in the encounter: RN/medical reconciliation Patient's location: home Provider's location: office  HEMATOLOGY HISTORY  # THROMBOCYTOPENIA  [2016- platelets-120s-130s;  Hb-13; MCV 102; white count-WNL]; OCT 2022- [place]- NEG;    HISTORY OF PRESENTING ILLNESS: Leslie Davidson 60 y.o.  female pleasant patient is here today with results of her blood work ordered follow-up thrombocytopenia.  Patient denies any bleeding or bruising.  Patient alert oriented x3.  No acute distress.  ASSESSMENT & PLAN:   Thrombocytopenia ([facility]) #Thrombocytopenia: Chronic [2016]-mild.  100,000.  Recent ultrasound negative for any liver diseases.  Labs October 2022 platelets 187; normal.  Hepatitis HIV peripheral smear normal.  I suspect patient has ITP.  This does not have to be treated on this patient's platelets are less than 50/starts bleeding.   #Mild macrocytosis MCV 102; recent B12 normal.  No liver disease noted on the ultrasound.  Clinically insignificant.  Would not recommend any bone marrow biopsy for further evaluation.  #Since patient is clinically stable I think is reasonable for the patient to  follow-up with PCP/can follow-up with [place] as needed.  Patient comfortable with the plan; to call us if any questions or concerns in the interim.   # DISPOSITION: # follow up as needed Dr.B   All questions were answered. The patient knows to call the clinic with any problems, questions or concerns.       Cammie Sickle, MD 01/31/2021 4:07 PM        MEDICATIONS:  Current Outpatient Medications  Medication Sig Dispense Refill   COLLAGEN PO Take by mouth.     RABEprazole (ACIPHEX) 20 MG tablet Take 1 tablet (20 mg total) by mouth daily. (Patient not taking: No sig reported) 30 tablet 2   No current facility-administered medications for this visit.     LABORATORY DATA:  I have reviewed the data as listed Lab Results  Component Value Date   WBC 5.5 01/15/2021   HGB 15.0 01/15/2021   HCT 44.6 01/15/2021   MCV 100.7 (H) 01/15/2021   PLT 185 01/15/2021   Recent Labs    02/27/20 0825 12/04/20 1100 12/20/20 1001  NA 135 138  --   K 4.3 4.0  --   CL 100 103  --   CO2 27 28  --   GLUCOSE 80 85  --   BUN 16 21  --   CREATININE 0.80 0.74  --   CALCIUM 9.2 9.1  --   PROT 6.7 6.0 6.5  ALBUMIN 4.4 4.0 4.4  AST _0 ALT 21 37* 30  ALKPHOS 68 69 66  BILITOT 0.6 0.5 0.7  BILIDIR  --  0.1 0.1     No results found.  ASSESSMENT & PLAN:   Thrombocytopenia ([facility]) #  Thrombocytopenia: Chronic [2016]-mild.  100,000.  Recent ultrasound negative for any liver diseases.  Labs October 2022 platelets 187; normal.  Hepatitis HIV peripheral smear normal.  I suspect patient has ITP.  This does not have to be treated on this patient's platelets are less than 50/starts bleeding.   #Mild macrocytosis MCV 102; recent B12 normal.  No liver disease noted on the ultrasound.  Clinically insignificant.  Would not recommend any bone marrow biopsy for further evaluation.  #Since patient is clinically stable I think is reasonable for the patient to follow-up with PCP/can follow-up with  [place] as needed.  Patient comfortable with the plan; to call us if any questions or concerns in the interim.   # DISPOSITION: # follow up as needed Dr.B     Cammie Sickle, MD 01/31/2021 4:07 PM

## 2021-01-31 NOTE — Progress Notes (Signed)
Pt confirmed availability for virtual visit as scheduled. Pt requests link to be sent by text to her cell [phone]. Pt has no concerns/complaints at this time.

## 2021-02-06 ENCOUNTER — Encounter: Payer: Self-pay | Admitting: Internal Medicine

## 2021-02-06 ENCOUNTER — Other Ambulatory Visit: Payer: Self-pay

## 2021-02-06 ENCOUNTER — Ambulatory Visit: Payer: BC Managed Care – PPO | Admitting: Internal Medicine

## 2021-02-06 DIAGNOSIS — R1013 Epigastric pain: Secondary | ICD-10-CM

## 2021-02-06 DIAGNOSIS — K219 Gastro-esophageal reflux disease without esophagitis: Secondary | ICD-10-CM | POA: Diagnosis not present

## 2021-02-06 DIAGNOSIS — E785 Hyperlipidemia, unspecified: Secondary | ICD-10-CM | POA: Diagnosis not present

## 2021-02-06 DIAGNOSIS — D696 Thrombocytopenia, unspecified: Secondary | ICD-10-CM

## 2021-02-06 NOTE — Progress Notes (Signed)
Patient ID: Leslie Davidson, female   DOB: 24-Sep-1960, 60 y.o.   MRN: [id number]   Subjective:    Patient ID: Leslie Davidson, female    DOB: [birth year], 60 y.o.   MRN: [id number]  This visit occurred during the SARS-CoV-2 public health emergency.  Safety protocols were in place, including screening questions prior to the visit, additional usage of staff PPE, and extensive cleaning of exam room while observing appropriate contact time as indicated for disinfecting solutions.   Patient here for scheduled follow up.   Chief Complaint  Patient presents with   Gastroesophageal Reflux    Aciphex resolved.   Marland Kitchen   HPI Was started on aciphex last visit.  Doing well.  Symptoms have resolved.  Now just taking if needed.  Not a persistent issue for her now.  No chest pain.  No sob.  Stays active.  No abdominal pain.  Bowels moving.  Overall doing better.     Past Medical History:  Diagnosis Date   Arthritis    GERD (gastroesophageal reflux disease)    Past Surgical History:  Procedure Laterality Date   AUGMENTATION MAMMAPLASTY Bilateral    2000   BREAST ENHANCEMENT SURGERY  2000   Family History  Problem Relation Age of Onset   Diabetes Mother    Cancer Father        Lung cancer   Arthritis Maternal Grandmother    Breast cancer Neg Hx    Social History   Socioeconomic History   Marital status: Married    Spouse name: Not on file   Number of children: Not on file   Years of education: Not on file   Highest education level: Not on file  Occupational History   Not on file  Tobacco Use   Smoking status: Former    Types: Cigarettes    Quit date: 03/31/1983    Years since quitting: 37.9   Smokeless tobacco: Never  Substance and Sexual Activity   Alcohol use: Yes    Alcohol/week: 0.0 standard drinks    Comment: Occasional    Drug use: No   Sexual activity: Yes    Partners: Male    Birth control/protection: Post-menopausal    Comment: Husband  Other Topics Concern   Not on file   Social History Narrative   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      Quit smoking in 1985. Rare alcohol.    Social Determinants of Health   Financial Resource Strain: Not on file  Food Insecurity: Not on file  Transportation Needs: Not on file  Physical Activity: Not on file  Stress: Not on file  Social Connections: Not on file     Review of Systems  Constitutional:  Negative for appetite change and unexpected weight change.  HENT:  Negative for congestion and sinus pressure.   Respiratory:  Negative for cough, chest tightness and shortness of breath.   Cardiovascular:  Negative for chest pain, palpitations and leg swelling.  Gastrointestinal:  Negative for abdominal pain, diarrhea, nausea and vomiting.  Genitourinary:  Negative for difficulty urinating and dysuria.  Musculoskeletal:  Negative for joint swelling and myalgias.  Skin:  Negative for color change and rash.  Neurological:  Negative for dizziness, light-headedness and headaches.  Psychiatric/Behavioral:  Negative for agitation and dysphoric mood.  Objective:     BP 100/60   Pulse 68   Temp (!) 97.4 F (36.3 C)   Resp 16   Ht 5\' 3"  (1.6 m)   Wt 96 lb 6.4 oz (43.7 kg)   LMP 01/02/2013   SpO2 99%   BMI 17.08 kg/m  Wt Readings from Last 3 Encounters:  02/06/21 96 lb 6.4 oz (43.7 kg)  01/15/21 97 lb (44 kg)  12/04/20 97 lb 6.4 oz (44.2 kg)    Physical Exam Vitals reviewed.  Constitutional:      General: She is not in acute distress.    Appearance: Normal appearance.  HENT:     Head: Normocephalic and atraumatic.     Right Ear: External ear normal.     Left Ear: External ear normal.  Eyes:     General: No scleral icterus.       Right eye: No discharge.        Left eye: No discharge.     Conjunctiva/sclera: Conjunctivae normal.  Neck:      Thyroid: No thyromegaly.  Cardiovascular:     Rate and Rhythm: Normal rate and regular rhythm.  Pulmonary:     Effort: No respiratory distress.     Breath sounds: Normal breath sounds. No wheezing.  Abdominal:     General: Bowel sounds are normal.     Palpations: Abdomen is soft.     Tenderness: There is no abdominal tenderness.  Musculoskeletal:        General: No swelling or tenderness.     Cervical back: Neck supple. No tenderness.  Lymphadenopathy:     Cervical: No cervical adenopathy.  Skin:    Findings: No erythema or rash.  Neurological:     Mental Status: She is alert.  Psychiatric:        Mood and Affect: Mood normal.        Behavior: Behavior normal.     Outpatient Encounter Medications as of 02/06/2021  Medication Sig   COLLAGEN PO Take by mouth.   RABEprazole (ACIPHEX) 20 MG tablet Take 1 tablet (20 mg total) by mouth daily.   No facility-administered encounter medications on file as of 02/06/2021.     Lab Results  Component Value Date   WBC 5.5 01/15/2021   HGB 15.0 01/15/2021   HCT 44.6 01/15/2021   PLT 185 01/15/2021   GLUCOSE 85 12/04/2020   CHOL 182 12/04/2020   TRIG 62.0 12/04/2020   HDL 77.00 12/04/2020   LDLCALC 93 12/04/2020   ALT 30 12/20/2020   AST 30 12/20/2020   NA 138 12/04/2020   K 4.0 12/04/2020   CL 103 12/04/2020   CREATININE 0.74 12/04/2020   BUN 21 12/04/2020   CO2 28 12/04/2020   TSH 1.09 12/04/2020   HGBA1C 5.9 09/06/2018    US Abdomen Complete  Result Date: 12/18/2020 CLINICAL DATA:  Chronic epigastric pain and GERD. EXAM: ABDOMEN ULTRASOUND COMPLETE COMPARISON:  Ultrasound February 01, 2012 FINDINGS: Gallbladder: No gallstones or wall thickening visualized. No sonographic Murphy sign noted by sonographer. Common bile duct: Diameter: 4 mm Liver: No focal lesion identified. Within normal limits in parenchymal echogenicity. Portal vein is patent on color Doppler imaging with normal direction of blood flow towards the liver.  IVC: No abnormality visualized. Pancreas: Visualized portion unremarkable. Spleen: Size and appearance within normal limits. Right Kidney: Length: 9.8 cm. Echogenicity within normal limits. No mass or hydronephrosis visualized. Left Kidney: Length: 10.7 cm. Echogenicity within normal limits. No mass or hydronephrosis visualized.  Abdominal aorta: No aneurysm visualized. Other findings: None. IMPRESSION: Unremarkable ultrasound of the abdomen. Electronically Signed   By: Dahlia Bailiff M.D.   On: 12/18/2020 09:27       Assessment & Plan:   Problem List Items Addressed This Visit     Abdominal pain, epigastric    Resolved with aciphex.       GERD (gastroesophageal reflux disease)    Started on aciphex last visit.  Symptoms resolved while on aciphex.  Now has to take prn.  Discussed GI - for evaluation and possible need for EGD       Hyperlipidemia    Follow lipid panel.       Thrombocytopenia (HCC)    Chronic - mild.  Recent ultrasound - negative for liver disease.  Hepatitis, HIV, peripheral smear - normal.  Saw hematology.  Suspected ITP.  Recommended to continue to follow.  Treat if pts platelets are less than 50 or starts bleeding.         Einar Pheasant, MD

## 2021-02-16 ENCOUNTER — Encounter: Payer: Self-pay | Admitting: Internal Medicine

## 2021-02-16 NOTE — Assessment & Plan Note (Signed)
Resolved with aciphex.

## 2021-02-16 NOTE — Assessment & Plan Note (Signed)
Follow lipid panel.   

## 2021-02-16 NOTE — Assessment & Plan Note (Signed)
Started on aciphex last visit.  Symptoms resolved while on aciphex.  Now has to take prn.  Discussed GI - for evaluation and possible need for EGD

## 2021-02-16 NOTE — Assessment & Plan Note (Signed)
Chronic - mild.  Recent ultrasound - negative for liver disease.  Hepatitis, HIV, peripheral smear - normal.  Saw hematology.  Suspected ITP.  Recommended to continue to follow.  Treat if pts platelets are less than 50 or starts bleeding.

## 2021-02-17 DIAGNOSIS — R1013 Epigastric pain: Secondary | ICD-10-CM | POA: Diagnosis not present

## 2021-02-17 DIAGNOSIS — K219 Gastro-esophageal reflux disease without esophagitis: Secondary | ICD-10-CM | POA: Diagnosis not present

## 2021-02-25 ENCOUNTER — Ambulatory Visit (INDEPENDENT_AMBULATORY_CARE_PROVIDER_SITE_OTHER): Payer: BC Managed Care – PPO

## 2021-02-25 ENCOUNTER — Other Ambulatory Visit: Payer: Self-pay

## 2021-02-25 DIAGNOSIS — E538 Deficiency of other specified B group vitamins: Secondary | ICD-10-CM

## 2021-02-25 MED ORDER — CYANOCOBALAMIN 1000 MCG/ML IJ SOLN
1000.0000 ug | Freq: Once | INTRAMUSCULAR | Status: AC
  Administered 2021-02-25: 1000 ug via INTRAMUSCULAR

## 2021-02-25 NOTE — Progress Notes (Signed)
Patient presented for B 12 injection to left deltoid, patient voiced no concerns nor showed any signs of distress during injection. 

## 2021-03-21 ENCOUNTER — Encounter: Payer: Self-pay | Admitting: Emergency Medicine

## 2021-03-21 ENCOUNTER — Emergency Department: Payer: BC Managed Care – PPO

## 2021-03-21 DIAGNOSIS — Y92009 Unspecified place in unspecified non-institutional (private) residence as the place of occurrence of the external cause: Secondary | ICD-10-CM | POA: Diagnosis not present

## 2021-03-21 DIAGNOSIS — W19XXXA Unspecified fall, initial encounter: Secondary | ICD-10-CM | POA: Diagnosis not present

## 2021-03-21 DIAGNOSIS — S72002A Fracture of unspecified part of neck of left femur, initial encounter for closed fracture: Principal | ICD-10-CM | POA: Diagnosis present

## 2021-03-21 DIAGNOSIS — Z79899 Other long term (current) drug therapy: Secondary | ICD-10-CM | POA: Diagnosis not present

## 2021-03-21 DIAGNOSIS — Z87891 Personal history of nicotine dependence: Secondary | ICD-10-CM | POA: Diagnosis not present

## 2021-03-21 DIAGNOSIS — Z681 Body mass index (BMI) 19 or less, adult: Secondary | ICD-10-CM | POA: Diagnosis not present

## 2021-03-21 DIAGNOSIS — E441 Mild protein-calorie malnutrition: Secondary | ICD-10-CM | POA: Diagnosis not present

## 2021-03-21 DIAGNOSIS — T148XXA Other injury of unspecified body region, initial encounter: Secondary | ICD-10-CM | POA: Diagnosis not present

## 2021-03-21 DIAGNOSIS — Z20822 Contact with and (suspected) exposure to covid-19: Secondary | ICD-10-CM | POA: Diagnosis not present

## 2021-03-21 DIAGNOSIS — K219 Gastro-esophageal reflux disease without esophagitis: Secondary | ICD-10-CM | POA: Diagnosis present

## 2021-03-21 DIAGNOSIS — S72002D Fracture of unspecified part of neck of left femur, subsequent encounter for closed fracture with routine healing: Secondary | ICD-10-CM | POA: Diagnosis not present

## 2021-03-21 DIAGNOSIS — M419 Scoliosis, unspecified: Secondary | ICD-10-CM | POA: Diagnosis not present

## 2021-03-21 DIAGNOSIS — R918 Other nonspecific abnormal finding of lung field: Secondary | ICD-10-CM | POA: Diagnosis not present

## 2021-03-21 DIAGNOSIS — S72012A Unspecified intracapsular fracture of left femur, initial encounter for closed fracture: Secondary | ICD-10-CM | POA: Diagnosis not present

## 2021-03-21 LAB — CBC WITH DIFFERENTIAL/PLATELET
Abs Immature Granulocytes: 0.03 10*3/uL (ref 0.00–0.07)
Basophils Absolute: 0 10*3/uL (ref 0.0–0.1)
Basophils Relative: 1 %
Eosinophils Absolute: 0 10*3/uL (ref 0.0–0.5)
Eosinophils Relative: 1 %
HCT: 40.8 % (ref 36.0–46.0)
Hemoglobin: 14.3 g/dL (ref 12.0–15.0)
Immature Granulocytes: 1 %
Lymphocytes Relative: 25 %
Lymphs Abs: 1.3 10*3/uL (ref 0.7–4.0)
MCH: 34.6 pg — ABNORMAL HIGH (ref 26.0–34.0)
MCHC: 35 g/dL (ref 30.0–36.0)
MCV: 98.8 fL (ref 80.0–100.0)
Monocytes Absolute: 0.3 10*3/uL (ref 0.1–1.0)
Monocytes Relative: 6 %
Neutro Abs: 3.5 10*3/uL (ref 1.7–7.7)
Neutrophils Relative %: 66 %
Platelets: 151 10*3/uL (ref 150–400)
RBC: 4.13 MIL/uL (ref 3.87–5.11)
RDW: 12.3 % (ref 11.5–15.5)
WBC: 5.1 10*3/uL (ref 4.0–10.5)
nRBC: 0 % (ref 0.0–0.2)

## 2021-03-21 LAB — TYPE AND SCREEN
ABO/RH(D): A POS
Antibody Screen: NEGATIVE

## 2021-03-21 LAB — COMPREHENSIVE METABOLIC PANEL
ALT: 55 U/L — ABNORMAL HIGH (ref 0–44)
AST: 48 U/L — ABNORMAL HIGH (ref 15–41)
Albumin: 4.6 g/dL (ref 3.5–5.0)
Alkaline Phosphatase: 74 U/L (ref 38–126)
Anion gap: 8 (ref 5–15)
BUN: 23 mg/dL (ref 8–23)
CO2: 28 mmol/L (ref 22–32)
Calcium: 10 mg/dL (ref 8.9–10.3)
Chloride: 100 mmol/L (ref 98–111)
Creatinine, Ser: 0.77 mg/dL (ref 0.44–1.00)
GFR, Estimated: >60 mL/min (ref >60)
Glucose, Bld: 111 mg/dL — ABNORMAL HIGH (ref 70–99)
Potassium: 3.9 mmol/L (ref 3.5–5.1)
Sodium: 136 mmol/L (ref 135–145)
Total Bilirubin: 0.7 mg/dL (ref 0.3–1.2)
Total Protein: 7.4 g/dL (ref 6.5–8.1)

## 2021-03-21 LAB — RESP PANEL BY RT-PCR (FLU A&B, COVID) ARPGX2
Influenza A by PCR: NEGATIVE
Influenza B by PCR: NEGATIVE
SARS Coronavirus 2 by RT PCR: NEGATIVE

## 2021-03-21 NOTE — ED Triage Notes (Signed)
Pt presents to the ED via POV following a fall in her home after a family dog knocked her over. She states she landed on her left hip and was unable to bear weight on that side of her body. Denies LOC, hitting her head, nor on blood thinners. No signs of distress noted.

## 2021-03-21 NOTE — ED Notes (Signed)
E-signature pad unavailable - Pt verbalized understanding of triage information - no additional concerns at this time.

## 2021-03-21 NOTE — ED Notes (Signed)
THis RN attempted IV insertion twice, pt tolerated well but IV was not successful

## 2021-03-22 ENCOUNTER — Inpatient Hospital Stay: Payer: BC Managed Care – PPO | Admitting: Anesthesiology

## 2021-03-22 ENCOUNTER — Inpatient Hospital Stay: Payer: BC Managed Care – PPO

## 2021-03-22 ENCOUNTER — Inpatient Hospital Stay
Admission: EM | Admit: 2021-03-22 | Discharge: 2021-03-23 | DRG: 481 | Disposition: A | Discharge status: Home / Self Care | Payer: BC Managed Care – PPO | Attending: Internal Medicine | Admitting: Internal Medicine

## 2021-03-22 ENCOUNTER — Encounter
Admission: EM | Disposition: A | Discharge status: Home / Self Care | Payer: Self-pay | Source: Home / Self Care | Attending: Internal Medicine

## 2021-03-22 DIAGNOSIS — Y92009 Unspecified place in unspecified non-institutional (private) residence as the place of occurrence of the external cause: Secondary | ICD-10-CM | POA: Diagnosis not present

## 2021-03-22 DIAGNOSIS — Z01818 Encounter for other preprocedural examination: Secondary | ICD-10-CM

## 2021-03-22 DIAGNOSIS — T148XXA Other injury of unspecified body region, initial encounter: Secondary | ICD-10-CM | POA: Diagnosis present

## 2021-03-22 DIAGNOSIS — Z681 Body mass index (BMI) 19 or less, adult: Secondary | ICD-10-CM | POA: Diagnosis not present

## 2021-03-22 DIAGNOSIS — S72002A Fracture of unspecified part of neck of left femur, initial encounter for closed fracture: Secondary | ICD-10-CM | POA: Diagnosis present

## 2021-03-22 DIAGNOSIS — W19XXXA Unspecified fall, initial encounter: Secondary | ICD-10-CM | POA: Diagnosis present

## 2021-03-22 DIAGNOSIS — Z87891 Personal history of nicotine dependence: Secondary | ICD-10-CM | POA: Diagnosis not present

## 2021-03-22 DIAGNOSIS — E441 Mild protein-calorie malnutrition: Secondary | ICD-10-CM | POA: Diagnosis present

## 2021-03-22 DIAGNOSIS — Z20822 Contact with and (suspected) exposure to covid-19: Secondary | ICD-10-CM | POA: Diagnosis present

## 2021-03-22 DIAGNOSIS — Z79899 Other long term (current) drug therapy: Secondary | ICD-10-CM | POA: Diagnosis not present

## 2021-03-22 DIAGNOSIS — S72002D Fracture of unspecified part of neck of left femur, subsequent encounter for closed fracture with routine healing: Secondary | ICD-10-CM

## 2021-03-22 DIAGNOSIS — K219 Gastro-esophageal reflux disease without esophagitis: Secondary | ICD-10-CM | POA: Diagnosis present

## 2021-03-22 HISTORY — PX: HIP PINNING,CANNULATED: SHX1758

## 2021-03-22 SURGERY — FIXATION, FEMUR, NECK, PERCUTANEOUS, USING SCREW
Anesthesia: General | Site: Hip | Laterality: Left

## 2021-03-22 MED ORDER — FENTANYL CITRATE (PF) 100 MCG/2ML IJ SOLN: 25.0000 ug | INTRAMUSCULAR | Status: DC | PRN

## 2021-03-22 MED ORDER — ONDANSETRON HCL 4 MG PO TABS: 4.0000 mg | ORAL_TABLET | Freq: Four times a day (QID) | ORAL | Status: DC | PRN

## 2021-03-22 MED ORDER — PHENYLEPHRINE 40 MCG/ML (10ML) SYRINGE FOR IV PUSH (FOR BLOOD PRESSURE SUPPORT)
PREFILLED_SYRINGE | INTRAVENOUS | Status: DC | PRN
  Administered 2021-03-22 (×9): 80 ug via INTRAVENOUS
  Administered 2021-03-22: 40 ug via INTRAVENOUS

## 2021-03-22 MED ORDER — OXYCODONE-ACETAMINOPHEN 5-325 MG PO TABS
2.0000 | ORAL_TABLET | Freq: Once | ORAL | Status: AC
  Administered 2021-03-22: 02:00:00 2 via ORAL
  Filled 2021-03-22: qty 2

## 2021-03-22 MED ORDER — KETOROLAC TROMETHAMINE 30 MG/ML IJ SOLN
INTRAMUSCULAR | Status: DC | PRN
Start: 2021-03-22 — End: 2021-03-22
  Administered 2021-03-22: 15 mg via INTRAVENOUS

## 2021-03-22 MED ORDER — PANTOPRAZOLE SODIUM 40 MG PO TBEC
40.0000 mg | DELAYED_RELEASE_TABLET | Freq: Every day | ORAL | Status: DC
  Administered 2021-03-22 – 2021-03-23 (×2): 40 mg via ORAL
  Filled 2021-03-22 (×2): qty 1

## 2021-03-22 MED ORDER — OXYCODONE HCL 5 MG PO TABS: 5.0000 mg | ORAL_TABLET | Freq: Once | ORAL | Status: DC | PRN

## 2021-03-22 MED ORDER — PROPOFOL 500 MG/50ML IV EMUL
INTRAVENOUS | Status: DC | PRN
  Administered 2021-03-22: 70 ug/kg/min via INTRAVENOUS

## 2021-03-22 MED ORDER — MIDAZOLAM HCL 2 MG/2ML IJ SOLN
INTRAMUSCULAR | Status: AC
  Filled 2021-03-22: qty 2

## 2021-03-22 MED ORDER — HYDROCODONE-ACETAMINOPHEN 5-325 MG PO TABS: 1.0000 | ORAL_TABLET | Freq: Four times a day (QID) | ORAL | 0 refills | Status: DC | PRN

## 2021-03-22 MED ORDER — PROPOFOL 500 MG/50ML IV EMUL
INTRAVENOUS | Status: AC
  Filled 2021-03-22: qty 50

## 2021-03-22 MED ORDER — FLEET ENEMA 7-19 GM/118ML RE ENEM: 1.0000 | ENEMA | Freq: Once | RECTAL | Status: DC | PRN

## 2021-03-22 MED ORDER — ONDANSETRON HCL 4 MG/2ML IJ SOLN: 4.0000 mg | Freq: Four times a day (QID) | INTRAMUSCULAR | Status: DC | PRN

## 2021-03-22 MED ORDER — METHOCARBAMOL 1000 MG/10ML IJ SOLN
500.0000 mg | Freq: Four times a day (QID) | INTRAVENOUS | Status: DC | PRN
  Filled 2021-03-22: qty 5

## 2021-03-22 MED ORDER — ENOXAPARIN SODIUM 40 MG/0.4ML IJ SOSY
40.0000 mg | PREFILLED_SYRINGE | INTRAMUSCULAR | Status: DC
  Administered 2021-03-23: 09:00:00 40 mg via SUBCUTANEOUS
  Filled 2021-03-22: qty 0.4

## 2021-03-22 MED ORDER — FENTANYL CITRATE (PF) 100 MCG/2ML IJ SOLN
INTRAMUSCULAR | Status: AC
  Filled 2021-03-22: qty 2

## 2021-03-22 MED ORDER — PROMETHAZINE HCL 25 MG/ML IJ SOLN: 6.2500 mg | INTRAMUSCULAR | Status: DC | PRN

## 2021-03-22 MED ORDER — MIDAZOLAM HCL 2 MG/2ML IJ SOLN
INTRAMUSCULAR | Status: DC | PRN
  Administered 2021-03-22: 2 mg via INTRAVENOUS

## 2021-03-22 MED ORDER — DROPERIDOL 2.5 MG/ML IJ SOLN
0.6250 mg | Freq: Once | INTRAMUSCULAR | Status: DC | PRN
  Filled 2021-03-22: qty 2

## 2021-03-22 MED ORDER — DOCUSATE SODIUM 100 MG PO CAPS
100.0000 mg | ORAL_CAPSULE | Freq: Two times a day (BID) | ORAL | Status: DC
  Administered 2021-03-22 – 2021-03-23 (×3): 100 mg via ORAL
  Filled 2021-03-22 (×3): qty 1

## 2021-03-22 MED ORDER — PROPOFOL 10 MG/ML IV BOLUS
INTRAVENOUS | Status: AC
  Filled 2021-03-22: qty 20

## 2021-03-22 MED ORDER — KETOROLAC TROMETHAMINE 15 MG/ML IJ SOLN
7.5000 mg | Freq: Four times a day (QID) | INTRAMUSCULAR | Status: DC
  Administered 2021-03-22 – 2021-03-23 (×3): 7.5 mg via INTRAVENOUS
  Filled 2021-03-22 (×3): qty 1

## 2021-03-22 MED ORDER — BISACODYL 10 MG RE SUPP: 10.0000 mg | Freq: Every day | RECTAL | Status: DC | PRN

## 2021-03-22 MED ORDER — ENSURE ENLIVE PO LIQD
237.0000 mL | Freq: Two times a day (BID) | ORAL | Status: DC
  Filled 2021-03-22 (×2): qty 237

## 2021-03-22 MED ORDER — BUPIVACAINE-EPINEPHRINE (PF) 0.5% -1:200000 IJ SOLN
INTRAMUSCULAR | Status: DC | PRN
  Administered 2021-03-22: 20 mL via PERINEURAL

## 2021-03-22 MED ORDER — EPHEDRINE SULFATE 50 MG/ML IJ SOLN
INTRAMUSCULAR | Status: DC | PRN
  Administered 2021-03-22: 2.5 mg via INTRAVENOUS
  Administered 2021-03-22: 5 mg via INTRAVENOUS

## 2021-03-22 MED ORDER — FENTANYL CITRATE PF 50 MCG/ML IJ SOSY
50.0000 ug | PREFILLED_SYRINGE | Freq: Once | INTRAMUSCULAR | Status: AC
  Administered 2021-03-22: 02:00:00 50 ug via INTRAVENOUS
  Filled 2021-03-22: qty 1

## 2021-03-22 MED ORDER — METOCLOPRAMIDE HCL 5 MG/ML IJ SOLN: 5.0000 mg | Freq: Three times a day (TID) | INTRAMUSCULAR | Status: DC | PRN

## 2021-03-22 MED ORDER — METOCLOPRAMIDE HCL 10 MG PO TABS: 5.0000 mg | ORAL_TABLET | Freq: Three times a day (TID) | ORAL | Status: DC | PRN

## 2021-03-22 MED ORDER — ADULT MULTIVITAMIN W/MINERALS CH
1.0000 | ORAL_TABLET | Freq: Every day | ORAL | Status: DC
  Administered 2021-03-23: 09:00:00 1 via ORAL
  Filled 2021-03-22 (×2): qty 1

## 2021-03-22 MED ORDER — ONDANSETRON HCL 4 MG/2ML IJ SOLN
INTRAMUSCULAR | Status: DC | PRN
  Administered 2021-03-22: 4 mg via INTRAVENOUS

## 2021-03-22 MED ORDER — FENTANYL CITRATE (PF) 100 MCG/2ML IJ SOLN
INTRAMUSCULAR | Status: DC | PRN
  Administered 2021-03-22 (×2): 25 ug via INTRAVENOUS

## 2021-03-22 MED ORDER — 0.9 % SODIUM CHLORIDE (POUR BTL) OPTIME
TOPICAL | Status: DC | PRN
  Administered 2021-03-22: 10:00:00 1000 mL

## 2021-03-22 MED ORDER — SODIUM CHLORIDE 0.9 % IV SOLN: INTRAVENOUS | Status: DC

## 2021-03-22 MED ORDER — MAGNESIUM HYDROXIDE 400 MG/5ML PO SUSP: 30.0000 mL | Freq: Every day | ORAL | Status: DC | PRN

## 2021-03-22 MED ORDER — ACETAMINOPHEN 10 MG/ML IV SOLN: 1000.0000 mg | Freq: Once | INTRAVENOUS | Status: DC | PRN

## 2021-03-22 MED ORDER — DIPHENHYDRAMINE HCL 12.5 MG/5ML PO ELIX: 12.5000 mg | ORAL_SOLUTION | ORAL | Status: DC | PRN

## 2021-03-22 MED ORDER — GLYCOPYRROLATE 0.2 MG/ML IJ SOLN
INTRAMUSCULAR | Status: DC | PRN
  Administered 2021-03-22: .2 mg via INTRAVENOUS

## 2021-03-22 MED ORDER — CEFAZOLIN SODIUM-DEXTROSE 2-4 GM/100ML-% IV SOLN
2.0000 g | Freq: Four times a day (QID) | INTRAVENOUS | Status: AC
  Administered 2021-03-22 – 2021-03-23 (×3): 2 g via INTRAVENOUS
  Filled 2021-03-22 (×3): qty 100

## 2021-03-22 MED ORDER — LACTATED RINGERS IV SOLN: INTRAVENOUS | Status: DC

## 2021-03-22 MED ORDER — METHOCARBAMOL 500 MG PO TABS
500.0000 mg | ORAL_TABLET | Freq: Four times a day (QID) | ORAL | Status: DC | PRN
  Filled 2021-03-22: qty 1

## 2021-03-22 MED ORDER — ONDANSETRON HCL 4 MG/2ML IJ SOLN
4.0000 mg | Freq: Once | INTRAMUSCULAR | Status: AC
  Administered 2021-03-22: 02:00:00 4 mg via INTRAVENOUS
  Filled 2021-03-22: qty 2

## 2021-03-22 MED ORDER — ACETAMINOPHEN 500 MG PO TABS
500.0000 mg | ORAL_TABLET | Freq: Four times a day (QID) | ORAL | Status: AC
  Administered 2021-03-22 – 2021-03-23 (×4): 500 mg via ORAL
  Filled 2021-03-22 (×4): qty 1

## 2021-03-22 MED ORDER — KETOROLAC TROMETHAMINE 30 MG/ML IJ SOLN
INTRAMUSCULAR | Status: AC
  Filled 2021-03-22: qty 1

## 2021-03-22 MED ORDER — ACETAMINOPHEN 325 MG PO TABS: 325.0000 mg | ORAL_TABLET | Freq: Four times a day (QID) | ORAL | Status: DC | PRN

## 2021-03-22 MED ORDER — MORPHINE SULFATE (PF) 2 MG/ML IV SOLN: 0.5000 mg | INTRAVENOUS | Status: DC | PRN

## 2021-03-22 MED ORDER — OXYCODONE HCL 5 MG/5ML PO SOLN: 5.0000 mg | Freq: Once | ORAL | Status: DC | PRN

## 2021-03-22 MED ORDER — HYDROCODONE-ACETAMINOPHEN 5-325 MG PO TABS
1.0000 | ORAL_TABLET | Freq: Four times a day (QID) | ORAL | Status: DC | PRN
  Administered 2021-03-22: 14:00:00 1 via ORAL
  Administered 2021-03-22: 08:00:00 2 via ORAL
  Administered 2021-03-23: 05:00:00 1 via ORAL
  Filled 2021-03-22 (×3): qty 1
  Filled 2021-03-22: qty 2
  Filled 2021-03-22: qty 1

## 2021-03-22 MED ORDER — CEFAZOLIN SODIUM-DEXTROSE 1-4 GM/50ML-% IV SOLN
INTRAVENOUS | Status: DC | PRN
  Administered 2021-03-22: 1 g via INTRAVENOUS

## 2021-03-22 MED ORDER — ASPIRIN EC 325 MG PO TBEC: 325.0000 mg | DELAYED_RELEASE_TABLET | Freq: Every day | ORAL | 0 refills | Status: DC

## 2021-03-22 SURGICAL SUPPLY — 37 items
BIT DRILL CANNULATED 5.0 (BIT) ×2 IMPLANT
BNDG COHESIVE 4X5 TAN ST LF (GAUZE/BANDAGES/DRESSINGS) ×3 IMPLANT
BNDG COHESIVE 6X5 TAN ST LF (GAUZE/BANDAGES/DRESSINGS) ×3 IMPLANT
CHLORAPREP W/TINT 26 (MISCELLANEOUS) ×6 IMPLANT
DRSG OPSITE POSTOP 3X4 (GAUZE/BANDAGES/DRESSINGS) ×3 IMPLANT
ELECT REM PT RETURN 9FT ADLT (ELECTROSURGICAL) ×3
ELECTRODE REM PT RTRN 9FT ADLT (ELECTROSURGICAL) ×1 IMPLANT
GAUZE 4X4 16PLY ~~LOC~~+RFID DBL (SPONGE) ×3 IMPLANT
GLOVE SURG ENC MOIS LTX SZ8 (GLOVE) ×6 IMPLANT
GLOVE SURG UNDER LTX SZ8 (GLOVE) ×3 IMPLANT
GOWN STRL REUS W/ TWL LRG LVL3 (GOWN DISPOSABLE) ×1 IMPLANT
GOWN STRL REUS W/ TWL XL LVL3 (GOWN DISPOSABLE) ×1 IMPLANT
GOWN STRL REUS W/TWL LRG LVL3 (GOWN DISPOSABLE) ×2
GOWN STRL REUS W/TWL XL LVL3 (GOWN DISPOSABLE) ×2
GUIDE PIN 3.2MM 5PK (PIN) ×3
MANIFOLD NEPTUNE II (INSTRUMENTS) ×3 IMPLANT
NDL FILTER BLUNT 18X1 1/2 (NEEDLE) ×1 IMPLANT
NEEDLE FILTER BLUNT 18X 1/2SAF (NEEDLE) ×2
NEEDLE FILTER BLUNT 18X1 1/2 (NEEDLE) ×1 IMPLANT
NEEDLE HYPO 22GX1.5 SAFETY (NEEDLE) ×3 IMPLANT
NS IRRIG 1000ML POUR BTL (IV SOLUTION) ×2 IMPLANT
PACK HIP COMPR (MISCELLANEOUS) ×3 IMPLANT
PIN GUIDE 3.2MM 5PK (PIN) IMPLANT
SCREW CANCELLOUS 7.0X75MM (Screw) ×4 IMPLANT
SCREW CANNULATED 7.0X70 (Screw) ×2 IMPLANT
SCREW CANNULATED 7.0X80 (Screw) ×2 IMPLANT
SPONGE T-LAP 18X18 ~~LOC~~+RFID (SPONGE) ×6 IMPLANT
STAPLER SKIN PROX 35W (STAPLE) ×3 IMPLANT
STRAP SAFETY 5IN WIDE (MISCELLANEOUS) ×3 IMPLANT
SUT PROLENE 2 0 FS (SUTURE) IMPLANT
SUT VIC AB 0 CT1 36 (SUTURE) ×3 IMPLANT
SUT VIC AB 0 SH 27 (SUTURE) IMPLANT
SUT VIC AB 2-0 CT1 27 (SUTURE) ×2
SUT VIC AB 2-0 CT1 TAPERPNT 27 (SUTURE) ×1 IMPLANT
SYR 20ML LL LF (SYRINGE) ×3 IMPLANT
SYR 5ML LL (SYRINGE) ×3 IMPLANT
WATER STERILE IRR 1000ML POUR (IV SOLUTION) ×4 IMPLANT

## 2021-03-22 NOTE — Anesthesia Postprocedure Evaluation (Signed)
Anesthesia Post Note  Patient: Leslie Davidson  Procedure(s) Performed: CANNULATED HIP PINNING (Left: Hip)  Patient location during evaluation: PACU Anesthesia Type: General Level of consciousness: awake and alert Pain management: pain level controlled Vital Signs Assessment: post-procedure vital signs reviewed and stable Respiratory status: spontaneous breathing, nonlabored ventilation and respiratory function stable Cardiovascular status: blood pressure returned to baseline and stable Postop Assessment: no apparent nausea or vomiting Anesthetic complications: no   No notable events documented.   Last Vitals:  Vitals:   03/22/21 1133 03/22/21 1146  BP: (!) 87/68 107/61  Pulse: 76 64  Resp: 13 (!) 25  Temp:  (!) 36.4 C  SpO2: 100% 100%    Last Pain:  Vitals:   03/22/21 1146  TempSrc:   PainSc: 0-No pain                 Iran Ouch

## 2021-03-22 NOTE — ED Provider Notes (Signed)
[facility] Emergency Department Provider Note ____________________________________________   Event Date/Time   First MD Initiated Contact with Patient 03/22/21 0011     (approximate)  I have reviewed the triage vital signs and the nursing notes.   HISTORY  Chief Complaint Fall    HPI KYNDLE SCHLENDER is a 60 y.o. female with history of GERD who presents to the emergency department with left hip pain.  States that her daughter lost power today so came over with her family including their black [place] retrieval and the dog came running in the house and jumped on the patient and knocked her onto the ground onto her left side.  Complaining of left hip pain but has been able to ambulate.  Did not hit her head or lose consciousness.  Is not on any blood thinners.  No neck or back pain.  No chest or abdominal pain.         Past Medical History:  Diagnosis Date   Arthritis    GERD (gastroesophageal reflux disease)     Patient Active Problem List   Diagnosis Date Noted   Closed displaced fracture of left femoral neck ([place]) 03/22/2021   Preoperative clearance 03/22/2021   Accidental fall 03/22/2021   Closed left hip fracture ([place]) 03/22/2021   Abnormal Pap smear 01/15/2021   Depressive disorder, not elsewhere classified 01/15/2021   Incontinence 12/08/2020   Thrombocytopenia ([place]) 06/03/2020   B12 deficiency 09/22/2018   Healthcare maintenance 08/22/2016   Abdominal pain, epigastric 11/14/2015   Abnormal mammogram 05/27/2015   Hyperlipidemia 04/22/2015   GERD (gastroesophageal reflux disease) 11/09/2014   Arthritis 11/09/2014   Edema 11/26/2011   Macrocytosis 07/16/2011    Past Surgical History:  Procedure Laterality Date   AUGMENTATION MAMMAPLASTY Bilateral    2000   BREAST ENHANCEMENT SURGERY  2000    Prior to Admission medications   Medication Sig Start Date End Date Taking? Authorizing Provider  COLLAGEN PO Take by mouth.    [provider]  RABEprazole (ACIPHEX) 20 MG tablet Take 1 tablet (20 mg total) by mouth daily. Patient taking differently: Take 20 mg by mouth daily as needed. 12/05/20   Einar Pheasant, MD    Allergies Patient has no active allergies.  Family History  Problem Relation Age of Onset   Diabetes Mother    Cancer Father        Lung cancer   Arthritis Maternal Grandmother    Breast cancer Neg Hx     Social History Social History   Tobacco Use   Smoking status: Former    Types: Cigarettes    Quit date: 03/31/1983    Years since quitting: 38.0   Smokeless tobacco: Never  Substance Use Topics   Alcohol use: Yes    Alcohol/week: 0.0 standard drinks    Comment: Occasional    Drug use: No    Review of Systems Constitutional: No fever. Eyes: No visual changes. ENT: No sore throat. Cardiovascular: Denies chest pain. Respiratory: Denies shortness of breath. Gastrointestinal: No nausea, vomiting, diarrhea. Genitourinary: Negative for dysuria. Musculoskeletal: Negative for back pain. Skin: Negative for rash. Neurological: Negative for focal weakness or numbness.   ____________________________________________   PHYSICAL EXAM:  VITAL SIGNS: ED Triage Vitals  Enc Vitals Group     BP 03/21/21 2110 117/61     Pulse Rate 03/21/21 2110 74     Resp 03/21/21 2110 16     Temp 03/21/21 2110 98.4 F (36.9 C)  Temp Source 03/21/21 2110 Oral     SpO2 03/21/21 2110 98 %     Weight 03/21/21 2111 100 lb (45.4 kg)     Height 03/21/21 2111 5\' 8"  (1.727 m)     Head Circumference --      Peak Flow --      Pain Score 03/21/21 2111 10     Pain Loc --      Pain Edu? --      Excl. in [place]? --    CONSTITUTIONAL: Alert and oriented and responds appropriately to questions. Well-appearing; well-nourished; GCS 15 HEAD: Normocephalic; atraumatic EYES: Conjunctivae clear, PERRL, EOMI ENT: normal nose; no rhinorrhea; moist mucous membranes; pharynx without lesions noted; no dental injury; no  septal hematoma NECK: Supple, no meningismus, no LAD; no midline spinal tenderness, step-off or deformity; trachea midline CARD: RRR; S1 and S2 appreciated; no murmurs, no clicks, no rubs, no gallops RESP: Normal chest excursion without splinting or tachypnea; breath sounds clear and equal bilaterally; no wheezes, no rhonchi, no rales; no hypoxia or respiratory distress CHEST:  chest wall stable, no crepitus or ecchymosis or deformity, nontender to palpation; no flail chest ABD/GI: Normal bowel sounds; non-distended; soft, non-tender, no rebound, no guarding; no ecchymosis or other lesions noted PELVIS:  stable, nontender to palpation BACK:  The back appears normal and is non-tender to palpation, there is no CVA tenderness; no midline spinal tenderness, step-off or deformity EXT: Normal ROM in all joints; tender over the left lateral hip and anterior hip, no edema; normal capillary refill; no cyanosis, no bony tenderness or bony deformity of patient's extremities, no joint effusion, compartments are soft, extremities are warm and well-perfused, no ecchymosis, 2+ DP pulses bilaterally SKIN: Normal color for age and race; warm NEURO: Moves all extremities equally PSYCH: The patient's mood and manner are appropriate. Grooming and personal hygiene are appropriate.  ____________________________________________   LABS (all labs ordered are listed, but only abnormal results are displayed)  Labs Reviewed  CBC WITH DIFFERENTIAL/PLATELET - Abnormal; Notable for the following components:      Result Value   MCH 34.6 (*)    All other components within normal limits  COMPREHENSIVE METABOLIC PANEL - Abnormal; Notable for the following components:   Glucose, Bld 111 (*)    AST 48 (*)    ALT 55 (*)    All other components within normal limits  RESP PANEL BY RT-PCR (FLU A&B, COVID) ARPGX2  TYPE AND SCREEN    ____________________________________________  EKG   ____________________________________________  RADIOLOGY I, Ericca Labra, personally viewed and evaluated these images (plain radiographs) as part of my medical decision making, as well as reviewing the written report by the radiologist.  ED MD interpretation: Chest x-ray clear.  Left hip x-ray shows left femoral neck fracture.  Official radiology report(s): DG Chest 1 View  Result Date: 03/21/2021 CLINICAL DATA:  Fall hip fracture EXAM: CHEST  1 VIEW COMPARISON:  12/03/2019 FINDINGS: Hyperinflated lungs. No focal airspace disease or pleural effusion. Normal cardiomediastinal silhouette. No pneumothorax. Mild scoliosis of the spine. IMPRESSION: No active disease. Electronically Signed   By: Donavan Foil M.D.   On: 03/21/2021 21:43   DG HIP UNILAT WITH PELVIS 2-3 VIEWS LEFT  Result Date: 03/21/2021 CLINICAL DATA:  fall in her home after a family dog knocked her over. She states she landed on her left hip and was unable to bear weight on that side of her body EXAM: DG HIP (WITH OR WITHOUT PELVIS) 2-3V LEFT COMPARISON:  None. FINDINGS: Acute impacted minimally displaced left femoral neck fracture. Frontal view of the right hip demonstrates no evidence of hip fracture or dislocation. No acute displaced fracture or diastasis of the bones of the pelvis. There is no evidence of arthropathy or other focal bone abnormality. IMPRESSION: Acute impacted minimally displaced left femoral neck fracture. Electronically Signed   By: Iven Finn M.D.   On: 03/21/2021 21:45    ____________________________________________   PROCEDURES  Procedure(s) performed (including Critical Care):  Procedures    ____________________________________________   INITIAL IMPRESSION / ASSESSMENT AND PLAN / ED COURSE  As part of my medical decision making, I reviewed the following data within the [place] History obtained from family,  Nursing notes reviewed and incorporated, Labs reviewed , Old chart reviewed, Radiograph reviewed , Discussed with admitting physician , A consult was requested and obtained from this/these consultant(s) Orthopedics, and Notes from prior ED visits         Patient here with mechanical fall with left femoral neck fracture.  Neurovascular intact distally.  No other sign of traumatic injury on exam.  Discussed case with Dr. Roland Rack on call with orthopedics who will plan to take patient to the operating room later today.  We will keep her n.p.o. at this time.  He recommends admission to the hospitalist service.  ED PROGRESS  Discussed patient's case with hospitalist, Dr. Damita Dunnings.  I have recommended admission and patient (and family if present) agree with this plan. Admitting physician will place admission orders.   I reviewed all nursing notes, vitals, pertinent previous records and reviewed/interpreted all EKGs, lab and urine results, imaging (as available).    ____________________________________________   FINAL CLINICAL IMPRESSION(S) / ED DIAGNOSES  Final diagnoses:  Fall  Closed fracture of left hip, initial encounter [facility])     ED Discharge Orders     None       *Please note:  AUNISTY REALI was evaluated in Emergency Department on 03/22/2021 for the symptoms described in the history of present illness. She was evaluated in the context of the global COVID-19 pandemic, which necessitated consideration that the patient might be at risk for infection with the SARS-CoV-2 virus that causes COVID-19. Institutional protocols and algorithms that pertain to the evaluation of patients at risk for COVID-19 are in a state of rapid change based on information released by regulatory bodies including the CDC and federal and state organizations. These policies and algorithms were followed during the patient's care in the ED.  Some ED evaluations and interventions may be delayed as a result of limited  staffing during and the pandemic.*   Note:  This document was prepared using Dragon voice recognition software and may include unintentional dictation errors.    Chasta Deshpande, Delice Bison, DO 03/22/21 [phone]

## 2021-03-22 NOTE — Anesthesia Procedure Notes (Signed)
Procedure Name: MAC Date/Time: 03/22/2021 10:02 AM Performed by: Lily Peer, Jaleena Viviani, CRNA Pre-anesthesia Checklist: Patient identified, Emergency Drugs available, Suction available, Patient being monitored and Timeout performed Oxygen Delivery Method: Simple face mask Induction Type: IV induction

## 2021-03-22 NOTE — H&P (Signed)
History and Physical    Leslie Davidson XBJ:[id number] DOB: 29-Jun-1960 DOA: 03/22/2021  PCP: Einar Pheasant, MD   Patient coming from: home  I have personally briefly reviewed patient's relevant medical records in [facility]  Chief Complaint: fall, left hip pain  HPI: Leslie Davidson is a 60 y.o. female with medical history significant for No significant past medical history who presents to the ED following a fall at home in which her dog knocked her over landing onto her left hip with immediate pain and inability to bear weight on the left side of her body.  She denied hitting her head or any other injuries.  She was previously in her usual state of health.  Denies chest pain, shortness of breath, cough, fever or chills.  No previous nausea, vomiting, abdominal pain or diarrhea  ED course: Vitals unremarkable Blood work including CBC and CMP also unremarkable COVID and flu negative  Imaging: Hip x-ray with acute impacted minimally displaced left femoral neck fracture Chest x-ray no active disease  The ED provider spoke with Ortho.  Hospitalist consulted for admission.   Review of Systems: As per HPI otherwise all other systems on review of systems negative.   Assessment/Plan    Closed displaced fracture of left femoral neck (HCC)   Preoperative clearance   Accidental fall - Pain control - Can proceed with proposed orthopedic repair.  No prior history of CAD, CHF, arrhythmias, syncope, COPD, OSA, stroke and denies acute symptoms apart from pain related to acute fracture - N.p.o. for surgery later today - Further orders per Ortho   DVT prophylaxis: SCDs Code Status: full code  Family Communication:  husband Disposition Plan: Back to previous home environment Consults called: Orthopedics Status:At the time of admission, it appears that the appropriate admission status for this patient is INPATIENT. This is judged to be reasonable and necessary in order to provide the required  intensity of service to ensure the patient's safety given the presenting symptoms, physical exam findings, and initial radiographic and laboratory data in the context of their  Comorbid conditions.   Patient requires inpatient status due to high intensity of service, high risk for further deterioration and high frequency of surveillance required.   I certify that at the point of admission it is my clinical judgment that the patient will require inpatient hospital care spanning beyond 2 midnights     Physical Exam: Vitals:   03/21/21 2110 03/21/21 2111  BP: 117/61   Pulse: 74   Resp: 16   Temp: 98.4 F (36.9 C)   TempSrc: Oral   SpO2: 98%   Weight:  45.4 kg  Height:  5\' 8"  (1.727 m)   Constitutional: Alert, oriented x 3 . Not in any apparent distress HEENT:      Head: Normocephalic and atraumatic.         Eyes: PERLA, EOMI, Conjunctivae are normal. Sclera is non-icteric.       Mouth/Throat: Mucous membranes are moist.       Neck: Supple with no signs of meningismus. Cardiovascular: Regular rate and rhythm. No murmurs, gallops, or rubs. 2+ symmetrical distal pulses are present . No JVD. No  LE edema Respiratory: Respiratory effort normal .Lungs sounds clear bilaterally. No wheezes, crackles, or rhonchi.  Gastrointestinal: Soft, non tender, non distended. Positive bowel sounds.  Genitourinary: No CVA tenderness. Musculoskeletal: Left hip pain, shortening and external rotation. No cyanosis, or erythema of extremities. Neurologic:  Face is symmetric. Moving all extremities. No gross focal  neurologic deficits . Skin: Skin is warm, dry.  No rash or ulcers Psychiatric: Mood and affect are appropriate     Past Medical History:  Diagnosis Date   Arthritis    GERD (gastroesophageal reflux disease)     Past Surgical History:  Procedure Laterality Date   AUGMENTATION MAMMAPLASTY Bilateral    2000   BREAST ENHANCEMENT SURGERY  2000     reports that she quit smoking about 38 years  ago. Her smoking use included cigarettes. She has never used smokeless tobacco. She reports current alcohol use. She reports that she does not use drugs.  No Active Allergies  Family History  Problem Relation Age of Onset   Diabetes Mother    Cancer Father        Lung cancer   Arthritis Maternal Grandmother    Breast cancer Neg Hx       Prior to Admission medications   Medication Sig Start Date End Date Taking? Authorizing Provider  COLLAGEN PO Take by mouth.    [provider]  RABEprazole (ACIPHEX) 20 MG tablet Take 1 tablet (20 mg total) by mouth daily. 12/05/20   Einar Pheasant, MD      Labs on Admission: I have personally reviewed following labs and imaging studies  CBC: Recent Labs  Lab 03/21/21 2220  WBC 5.1  NEUTROABS 3.5  HGB 14.3  HCT 40.8  MCV 98.8  PLT 956   Basic Metabolic Panel: Recent Labs  Lab 03/21/21 2220  NA 136  K 3.9  CL 100  CO2 28  GLUCOSE 111*  BUN 23  CREATININE 0.77  CALCIUM 10.0   GFR: Estimated Creatinine Clearance: 52.3 mL/min (by C-G formula based on SCr of 0.77 mg/dL). Liver Function Tests: Recent Labs  Lab 03/21/21 2220  AST 48*  ALT 55*  ALKPHOS 74  BILITOT 0.7  PROT 7.4  ALBUMIN 4.6   No results for input(s): LIPASE, AMYLASE in the last 168 hours. No results for input(s): AMMONIA in the last 168 hours. Coagulation Profile: No results for input(s): INR, PROTIME in the last 168 hours. Cardiac Enzymes: No results for input(s): CKTOTAL, CKMB, CKMBINDEX, TROPONINI in the last 168 hours. BNP (last 3 results) No results for input(s): PROBNP in the last 8760 hours. HbA1C: No results for input(s): HGBA1C in the last 72 hours. CBG: No results for input(s): GLUCAP in the last 168 hours. Lipid Profile: No results for input(s): CHOL, HDL, LDLCALC, TRIG, CHOLHDL, LDLDIRECT in the last 72 hours. Thyroid Function Tests: No results for input(s): TSH, T4TOTAL, FREET4, T3FREE, THYROIDAB in the last 72 hours. Anemia  Panel: No results for input(s): VITAMINB12, FOLATE, FERRITIN, TIBC, IRON, RETICCTPCT in the last 72 hours. Urine analysis:    Component Value Date/Time   COLORURINE YELLOW 12/04/2020 1100   APPEARANCEUR CLEAR 12/04/2020 1100   LABSPEC <=1.005 (A) 12/04/2020 1100   PHURINE 5.5 12/04/2020 1100   GLUCOSEU NEGATIVE 12/04/2020 1100   HGBUR NEGATIVE 12/04/2020 1100   BILIRUBINUR NEGATIVE 12/04/2020 1100   KETONESUR NEGATIVE 12/04/2020 1100   UROBILINOGEN 0.2 12/04/2020 1100   NITRITE NEGATIVE 12/04/2020 1100   LEUKOCYTESUR NEGATIVE 12/04/2020 1100    Radiological Exams on Admission: DG Chest 1 View  Result Date: 03/21/2021 CLINICAL DATA:  Fall hip fracture EXAM: CHEST  1 VIEW COMPARISON:  12/03/2019 FINDINGS: Hyperinflated lungs. No focal airspace disease or pleural effusion. Normal cardiomediastinal silhouette. No pneumothorax. Mild scoliosis of the spine. IMPRESSION: No active disease. Electronically Signed   By: Madie Reno.D.  On: 03/21/2021 21:43   DG HIP UNILAT WITH PELVIS 2-3 VIEWS LEFT  Result Date: 03/21/2021 CLINICAL DATA:  fall in her home after a family dog knocked her over. She states she landed on her left hip and was unable to bear weight on that side of her body EXAM: DG HIP (WITH OR WITHOUT PELVIS) 2-3V LEFT COMPARISON:  None. FINDINGS: Acute impacted minimally displaced left femoral neck fracture. Frontal view of the right hip demonstrates no evidence of hip fracture or dislocation. No acute displaced fracture or diastasis of the bones of the pelvis. There is no evidence of arthropathy or other focal bone abnormality. IMPRESSION: Acute impacted minimally displaced left femoral neck fracture. Electronically Signed   By: Iven Finn M.D.   On: 03/21/2021 21:45       Athena Masse MD [facility]   03/22/2021, 2:19 AM

## 2021-03-22 NOTE — Transfer of Care (Signed)
Immediate Anesthesia Transfer of Care Note  Patient: Leslie Davidson  Procedure(s) Performed: CANNULATED HIP PINNING (Left: Hip)  Patient Location: PACU  Anesthesia Type:MAC  Level of Consciousness: drowsy  Airway & Oxygen Therapy: Patient Spontanous Breathing  Post-op Assessment: Report given to RN and Post -op Vital signs reviewed and stable  Post vital signs: Reviewed and stable  Last Vitals:  Vitals Value Taken Time  BP 87/42 03/22/21 1052  Temp    Pulse 64 03/22/21 1054  Resp 26 03/22/21 1054  SpO2 96 % 03/22/21 1054  Vitals shown include unvalidated device data.  Last Pain:  Vitals:   03/22/21 0800  TempSrc:   PainSc: 6          Complications: No notable events documented.

## 2021-03-22 NOTE — Progress Notes (Signed)
Pt seen and admitted for by Dr Damita Dunnings earlier this am, please see note for detailed H&P.     Leslie Davidson is a 60 y.o. female with medical history significant for No significant past medical history who presents to the ED following a fall at home in which her dog knocked her over landing onto her left hip with immediate pain and inability to bear weight on the left side of her body. Hip x-ray with acute impacted minimally displaced left femoral neck fracture Orthopedics consulted, and she underwent surgical repair of the femoral neck fracture.  Pain control and therapy evaluations in am.    Hosie Poisson, MD

## 2021-03-22 NOTE — Op Note (Signed)
03/22/2021  11:03 AM  Patient:   Leslie Davidson  Pre-Op Diagnosis:   Valgus-impacted left femoral neck fracture.  Post-Op Diagnosis:   Same.  Procedure:   In situ cannulated screw fixation of valgus-impacted left femoral neck fracture.  Surgeon:   Pascal Lux, MD  Assistant:   None  Anesthesia:   Local with IV sedation  Findings:   As above.  Complications:   None  EBL:   5 cc  Fluids:   500 cc crystalloid  UOP:   None  TT:   None  Drains:   None  Closure:   Staples  Implants:   Biomet 6.5 mm cannulated screws (16 mm) 3  Brief Clinical Note:   The patient is a 60 year old female who sustained above-noted injury last evening when she got knocked over by her daughter's dog who jumped up to greet her, causing her to fall onto her left side. She presented to the emergency room where x-rays demonstrated the above-noted injury. The patient has been cleared medically and presents at this time for definitive management of this injury.  Procedure:   The patient was brought into the operating room and lain in the supine position on the fracture table. After obtaining adequate IV sedation, the uninjured leg was placed in a flexed and abducted position over the well-leg holder while the operative leg was placed in gentle longitudinal traction with some internal rotation. The adequacy of fracture position was verified fluoroscopically in AP and lateral projections before the lateral aspect of the left hip and thigh were prepped with ChloraPrep solution and draped sterilely. Preoperative antibiotics were administered. A timeout was performed to verify the appropriate surgical site before the expected surgical incision site was injected with 20 cc of 0.5% Sensorcaine with epinephrine.   An approximately 3-4 cm incision was made over the lateral aspect of the lower part of the greater trochanter as verified fluoroscopically. This incision was carried down through the subcutaneous  cutaneous tissues to expose the iliotibial band. This was split the length the incision to expose the lateral aspect of the proximal femur at the level of the inferior most part of the greater trochanter. Under fluoroscopic guidance, a guide wire was drilled up through the femoral neck along the calcar into the femoral head to rest within 5 mm of subchondral bone. Its position was assessed fluoroscopically in AP and lateral projections and found to be excellent. Two additional guide wires were placed in parallel fashion more proximally, anterior and posterior to the original pin in an inverted triangular configuration. Again the position of these pins was verified fluoroscopically in AP, lateral, and oblique projections and found to be excellent. The length of each of these pins was measured before each pin was overdrilled with the appropriate cannulated drill. Each of these screws was inserted and advanced to within 8-10 mm of subchondral bone. Again the position of each of these screws was assessed fluoroscopically in AP, lateral, and oblique projections, and found to be excellent.  The wound was copiously irrigated with sterile saline solution before the IT band was reapproximated using #0 Vicryl interrupted sutures. The subcutaneous tissues also were closed using 2-0 Vicryl interrupted sutures before the skin was closed using staples. A sterile occlusive dressing was applied to the wound. The patient was awakened and transferred back to her hospital bed before being returned to the recovery room in satisfactory condition after tolerating the procedure well.

## 2021-03-22 NOTE — Consult Note (Signed)
ORTHOPAEDIC CONSULTATION  REQUESTING PHYSICIAN: Hosie Poisson, MD  Chief Complaint:   Left hip pain.  History of Present Illness: Leslie Davidson is a 60 y.o. female in otherwise good health who lives independently with her husband.  The patient was in her usual state of health yesterday afternoon when she apparently got knocked over by her daughter's dog, landing on her left hip.  She was unable to weight-bear and therefore was brought to the emergency room where x-rays demonstrated a valgus impacted left femoral neck fracture.  The patient denies any associated injuries.  She did not strike her head or lose consciousness.  The patient also denies any lightheadedness, dizziness, chest pain, shortness of breath, or other symptoms which may have precipitated her fall.  She is admitted at this time for definitive management of her injury.  Past Medical History:  Diagnosis Date   Arthritis    GERD (gastroesophageal reflux disease)    Past Surgical History:  Procedure Laterality Date   AUGMENTATION MAMMAPLASTY Bilateral    2000   BREAST ENHANCEMENT SURGERY  2000   Social History   Socioeconomic History   Marital status: Married    Spouse name: Not on file   Number of children: Not on file   Years of education: Not on file   Highest education level: Not on file  Occupational History   Not on file  Tobacco Use   Smoking status: Former    Types: Cigarettes    Quit date: 03/31/1983    Years since quitting: 38.0   Smokeless tobacco: Never  Substance and Sexual Activity   Alcohol use: Yes    Alcohol/week: 0.0 standard drinks    Comment: Occasional    Drug use: No   Sexual activity: Yes    Partners: Male    Birth control/protection: Post-menopausal    Comment: Husband  Other Topics Concern   Not on file  Social History Narrative   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      Quit smoking in 1985. Rare alcohol.    Social Determinants of Health   Financial Resource Strain: Not on file  Food Insecurity: Not on file  Transportation Needs: Not on file  Physical Activity: Not on file  Stress: Not on file  Social Connections: Not on file   Family History  Problem Relation Age of Onset   Diabetes Mother    Cancer Father        Lung cancer   Arthritis Maternal Grandmother    Breast cancer Neg Hx    No Active Allergies Prior to Admission medications   Medication Sig Start Date End Date Taking? Authorizing Provider  COLLAGEN PO Take by mouth.    [provider]  RABEprazole (ACIPHEX) 20 MG tablet Take 1 tablet (20 mg total) by mouth daily. Patient taking differently: Take 20 mg by mouth daily as needed. 12/05/20   Einar Pheasant, MD   DG Chest 1 View  Result Date: 03/21/2021 CLINICAL DATA:  Fall hip fracture EXAM: CHEST  1 VIEW COMPARISON:  12/03/2019 FINDINGS: Hyperinflated lungs. No focal airspace disease or pleural effusion. Normal cardiomediastinal silhouette. No pneumothorax. Mild scoliosis of the spine. IMPRESSION: No active disease. Electronically Signed   By: Donavan Foil M.D.   On: 03/21/2021 21:43   DG HIP UNILAT WITH PELVIS 2-3 VIEWS LEFT  Result Date: 03/21/2021 CLINICAL DATA:  fall in her home after a family dog knocked her over. She states she landed on her left hip and was unable to bear weight on that side of her body EXAM: DG HIP (WITH OR WITHOUT PELVIS) 2-3V LEFT COMPARISON:  None. FINDINGS: Acute impacted minimally displaced left femoral neck fracture. Frontal view of the right hip demonstrates no evidence of hip fracture or dislocation. No acute displaced fracture or diastasis of the bones of the pelvis. There is no evidence of arthropathy or other focal bone abnormality. IMPRESSION: Acute impacted minimally displaced left femoral  neck fracture. Electronically Signed   By: Iven Finn M.D.   On: 03/21/2021 21:45    Positive ROS: All other systems have been reviewed and were otherwise negative with the exception of those mentioned in the HPI and as above.  Physical Exam: General:  Alert, no acute distress Psychiatric:  Patient is competent for consent with normal mood and affect   Cardiovascular:  No pedal edema Respiratory:  No wheezing, non-labored breathing GI:  Abdomen is soft and non-tender Skin:  No lesions in the area of chief complaint Neurologic:  Sensation intact distally Lymphatic:  No axillary or cervical lymphadenopathy  Orthopedic Exam:  Orthopedic examination is limited to the left hip and lower extremity.  The left lower extremity is symmetrically aligned with the right lower extremity.  Skin inspection around the left hip is unremarkable.  No swelling, erythema, ecchymosis, abrasions, or other skin abnormalities are identified.  She has minimal tenderness to palpation of the lateral aspect of the left hip.  She has more mild to moderate pain with gentle logrolling of the hip/leg.  She is neurovascularly intact to the left lower extremity and foot, demonstrating the ability to dorsiflex and plantarflex her toes and ankle.  Sensations intact to light touch to all distributions.  She has good capillary refill to her left foot..  X-rays:  Recent x-rays of the pelvis and left hip are available for review and have been reviewed by myself.  These films demonstrate a valgus impacted left femoral neck fracture.  The fracture appears to be well aligned in both the AP and lateral projections.  No significant degenerative changes are identified.  No lytic lesions or other acute bony abnormalities are noted.  Assessment: Valgus impacted left femoral neck fracture.  Plan: The treatment options, including both surgical and nonsurgical choices, have been discussed in detail with the patient and her husband.  They  would like to proceed with surgical intervention to include an in situ cannulated screw fixation of the valgus impacted left femoral neck fracture.  The risks (including bleeding, infection, nerve and/or blood vessel injury, persistent or recurrent pain, loosening or failure of the components, leg length inequality, dislocation, need for further surgery, blood clots, strokes, heart attacks or arrhythmias, pneumonia, etc.) and benefits of the surgical procedure were discussed.  The patient states his/her understanding and agrees to proceed.  A formal written consent will be obtained by the nursing staff.  Thank you for asking me to participate in the care of this most pleasant woman.  I will be happy to follow her with you.   Pascal Lux, MD  Beeper #:  [phone]  03/22/2021 9:09 AM

## 2021-03-22 NOTE — Progress Notes (Signed)
Initial Nutrition Assessment  DOCUMENTATION CODES:   Underweight  INTERVENTION:   Ensure Enlive po BID, each supplement provides 350 kcal and 20 grams of protein  MVI po daily   NUTRITION DIAGNOSIS:   Increased nutrient needs related to hip fracture as evidenced by estimated needs.  GOAL:   Patient will meet greater than or equal to 90% of their needs  MONITOR:   PO intake, Supplement acceptance, Labs, Weight trends, Skin, I & O's  REASON FOR ASSESSMENT:   Consult Hip fracture protocol  ASSESSMENT:   60 y/o female with h/o GERD, B12 deficiency and MDD who is admitted with hip fracture after a fall.  RD working remotely.  Unable to speak with pt as pt in surgery at time of RD visit. Pt with increased estimated needs r/t hip fracture. Pt NPO today for planned surgical pinning. RD will add supplements and MVI to help pt meet her estimated needs. Per chart, pt appears weight stable at baseline. RD suspects pt with malnutrition as pt is underweight. RD will obtain nutrition related history and exam at follow-up.   Medications reviewed and include: hydrocodone  Labs reviewed: K 3.9 wnl, AST 48(H), ALT 55(H)  NUTRITION - FOCUSED PHYSICAL EXAM: Unable to perform at this time   Diet Order:   Diet Order             Diet NPO time specified  Diet effective now                  EDUCATION NEEDS:   Not appropriate for education at this time  Skin:  Skin Assessment: Reviewed RN Assessment (incision L hip)  Last BM:  pta  Height:   Ht Readings from Last 1 Encounters:  03/21/21 5\' 8"  (1.727 m)    Weight:   Wt Readings from Last 1 Encounters:  03/21/21 45.4 kg    Ideal Body Weight:  63.6 kg  BMI:  Body mass index is 15.2 kg/m.  Estimated Nutritional Needs:   Kcal:  1500-1700kcal/day  Protein:  75-85g/day  Fluid:  1.4-1.6L/day  Koleen Distance MS, RD, LDN Please refer to [facility] for RD and/or RD on-call/weekend/after hours pager

## 2021-03-22 NOTE — Anesthesia Preprocedure Evaluation (Addendum)
Anesthesia Evaluation   Patient awake    Reviewed: Allergy & Precautions, NPO status , Patient's Chart, lab work & pertinent test results  Airway Mallampati: III  TM Distance: >3 FB Neck ROM: Full    Dental  (+) Missing,    Pulmonary neg pulmonary ROS, former smoker,    Pulmonary exam normal breath sounds clear to auscultation       Cardiovascular negative cardio ROS Normal cardiovascular exam Rhythm:Regular Rate:Normal     Neuro/Psych PSYCHIATRIC DISORDERS Depression negative neurological ROS     GI/Hepatic negative GI ROS, Elevated LFTs   Endo/Other  negative endocrine ROS  Renal/GU negative Renal ROS  negative genitourinary   Musculoskeletal  (+) Arthritis ,   Abdominal Normal abdominal exam  (+)   Peds negative pediatric ROS (+)  Hematology negative hematology ROS (+)   Anesthesia Other Findings femoral neck fracture  Reproductive/Obstetrics negative OB ROS                            Anesthesia Physical Anesthesia Plan  ASA: 1  Anesthesia Plan: General   Post-op Pain Management:    Induction: Intravenous  PONV Risk Score and Plan: Propofol infusion, Treatment may vary due to age or medical condition and Midazolam  Airway Management Planned: Natural Airway and Nasal Cannula  Additional Equipment:   Intra-op Plan:   Post-operative Plan: Extubation in OR  Informed Consent: I have reviewed the patients History and Physical, chart, labs and discussed the procedure including the risks, benefits and alternatives for the proposed anesthesia with the patient or authorized representative who has indicated his/her understanding and acceptance.     Dental Advisory Given  Plan Discussed with: Anesthesiologist, CRNA and Surgeon  Anesthesia Plan Comments: (Patient consented for risks of anesthesia including but not limited to:  - adverse reactions to medications - damage to  eyes, teeth, lips or other oral mucosa - nerve damage due to positioning  - sore throat or hoarseness - Damage to heart, brain, nerves, lungs, other parts of body  Patient voiced understanding.)       Anesthesia Quick Evaluation

## 2021-03-22 NOTE — Plan of Care (Signed)
  Problem: Health Behavior/Discharge Planning: Goal: Ability to manage health-related needs will improve Outcome: Progressing   

## 2021-03-23 ENCOUNTER — Other Ambulatory Visit: Payer: Self-pay

## 2021-03-23 DIAGNOSIS — S72002D Fracture of unspecified part of neck of left femur, subsequent encounter for closed fracture with routine healing: Secondary | ICD-10-CM | POA: Diagnosis not present

## 2021-03-23 MED ORDER — ENSURE ENLIVE PO LIQD: 237.0000 mL | Freq: Two times a day (BID) | ORAL | 12 refills | Status: DC

## 2021-03-23 MED ORDER — ADULT MULTIVITAMIN W/MINERALS CH: 1.0000 | ORAL_TABLET | Freq: Every day | ORAL | Status: DC

## 2021-03-23 NOTE — Progress Notes (Signed)
Discharge Note: Reviewed discharge instructions with pt. Pt verbalized understanding. Pt discharged with a rolling walker and all personal belongings. Pt wheeled by staff. Pt transported to home via family vehicle.

## 2021-03-23 NOTE — Evaluation (Signed)
Physical Therapy Evaluation Patient Details Name: Leslie Davidson MRN: [id number] DOB: September 17, 1960 Today's Date: 03/23/2021  History of Present Illness  Pt is a 60 y/o F admitted on 03/22/21 with c/c of fall & L hip pain. Pt found to have valgus-impacted L femoral neck fx & underwent in situ cannulated screw fixation by Dr. Roland Rack on 03/22/21. Pt is PWB LLE.  Clinical Impression  Pt seen for PT tx with husband present for session. PT provides pt with HEP handout & briefly reviews all exercises (pt requires AAROM for hip abduction & SLR but otherwise performs exercises without assistance). PT provides education re: PWB & how to maintain this during gait/stairs. Pt is able to ambulate household distances with RW & negotiate stairs with RW & assistance from husband. At this time pt is safe to d/c home with PRN assistance & use of RW. Will continue to follow pt acutely to progress gait & LLE strengthening.       Recommendations for follow up therapy are one component of a multi-disciplinary discharge planning process, led by the attending physician.  Recommendations may be updated based on patient status, additional functional criteria and insurance authorization.  Follow Up Recommendations Outpatient PT (when pt can begin weight bearing on LLE)    Assistance Recommended at Discharge PRN  Functional Status Assessment Patient has had a recent decline in their functional status and demonstrates the ability to make significant improvements in function in a reasonable and predictable amount of time.  Equipment Recommendations  Rolling walker (2 wheels)    Recommendations for Other Services       Precautions / Restrictions Precautions Precautions: Fall Restrictions Weight Bearing Restrictions: Yes LLE Weight Bearing: Partial weight bearing      Mobility  Bed Mobility Overal bed mobility: Modified Independent             General bed mobility comments: uses BUE to assist LLE on/off bed PRN     Transfers Overall transfer level: Modified independent Equipment used: Rolling walker (2 wheels)               General transfer comment: Initial educational/instructional cuing for proper hand placement on stable surface during sit<>stand    Ambulation/Gait Ambulation/Gait assistance: Supervision Gait Distance (Feet): 65 Feet (+ 65 ft) Assistive device: Rolling walker (2 wheels) Gait Pattern/deviations: Decreased step length - right;Decreased step length - left;Decreased stride length;Decreased dorsiflexion - left Gait velocity: slightly decreased     General Gait Details: Pt initially holds L heel off of floor with ability to place foot flat with cuing from PT. PT provides education/instruction/demo of PWB gait with RW & pt is able to progress to this, maintaining PWB fairly well.  Stairs Stairs: Yes Stairs assistance:  (CGA 1st trial, supervision 2nd trial from PT with husband providing assistance for RW stabilization) Stair Management: No rails;Step to pattern;Backwards;With walker Number of Stairs: 2 (x 2 trials) General stair comments: PT provides education/instructional cuing & demo with husband present & able to recall compensatory pattern as well. Pt's husband assists with RW stabilization as pt negotiates 2 steps x 2 trials.  Wheelchair Mobility    Modified Rankin (Stroke Patients Only)       Balance Overall balance assessment: Mild deficits observed, not formally tested   Sitting balance-Leahy Scale: Normal     Standing balance support: During functional activity Standing balance-Leahy Scale: Good  Pertinent Vitals/Pain Pain Assessment: 0-10 Pain Score: 4  Pain Location: LLE Pain Descriptors / Indicators: Discomfort Pain Intervention(s): Monitored during session    Home Living Family/patient expects to be discharged to:: Private residence Living Arrangements: Spouse/significant other Available Help at  Discharge: Family Type of Home: House Home Access: Stairs to enter Entrance Stairs-Rails: None Technical brewer of Steps: 2   Home Layout: One level Home Equipment: None      Prior Function Prior Level of Function : Independent/Modified Independent                     Hand Dominance        Extremity/Trunk Assessment   Upper Extremity Assessment Upper Extremity Assessment: Overall WFL for tasks assessed    Lower Extremity Assessment Lower Extremity Assessment:  (LLE limited by post surgical pain, not formally tested)    Cervical / Trunk Assessment Cervical / Trunk Assessment: Normal  Communication   Communication: No difficulties  Cognition Arousal/Alertness: Awake/alert Behavior During Therapy: WFL for tasks assessed/performed Overall Cognitive Status: Within Functional Limits for tasks assessed                                          General Comments      Exercises     Assessment/Plan    PT Assessment Patient needs continued PT services  PT Problem List Decreased strength;Decreased mobility;Decreased safety awareness;Decreased balance;Decreased activity tolerance;Decreased knowledge of use of DME;Pain       PT Treatment Interventions DME instruction;Therapeutic exercise;Gait training;Balance training;Functional mobility training;Therapeutic activities;Patient/family education;Modalities;Neuromuscular re-education    PT Goals (Current goals can be found in the Care Plan section)  Acute Rehab PT Goals Patient Stated Goal: go home ASAP PT Goal Formulation: With patient/family Time For Goal Achievement: 04/06/21 Potential to Achieve Goals: Good    Frequency BID   Barriers to discharge        Co-evaluation               AM-PAC PT "6 Clicks" Mobility  Outcome Measure Help needed turning from your back to your side while in a flat bed without using bedrails?: None Help needed moving from lying on your back to  sitting on the side of a flat bed without using bedrails?: None Help needed moving to and from a bed to a chair (including a wheelchair)?: A Little Help needed standing up from a chair using your arms (e.g., wheelchair or bedside chair)?: None Help needed to walk in hospital room?: A Little Help needed climbing 3-5 steps with a railing? : A Little 6 Click Score: 21    End of Session   Activity Tolerance: Patient tolerated treatment well Patient left: in bed;with call bell/phone within reach;with family/visitor present Nurse Communication: Mobility status PT Visit Diagnosis: Muscle weakness (generalized) (M62.81);Difficulty in walking, not elsewhere classified (R26.2)    Time: 0940-1001 PT Time Calculation (min) (ACUTE ONLY): 21 min   Charges:   PT Evaluation $PT Eval Low Complexity: 1 Low PT Treatments $Therapeutic Activity: 8-22 mins        Lavone Nian, PT, DPT 03/23/21, 10:12 AM   Waunita Schooner 03/23/2021, 10:11 AM

## 2021-03-23 NOTE — TOC Transition Note (Signed)
Transition of Care [facility]) - CM/SW Discharge Note   Patient Details  Name: Leslie Davidson MRN: [id number] Date of Birth: [birth year]  [facility] CM/SW Contact:  Izola Price, RN Phone Number: 03/23/2021, 11:05 AM   Clinical Narrative:   Patient being discharged to Home/Self Care. DME RW recommended by therapy and ordered via [employer] to be delivered to room today prior to discharge. Simmie Davies RN CM     Final next level of care: Home/Self Care Barriers to Discharge: Barriers Resolved   Patient Goals and CMS Choice     Choice offered to / list presented to : NA  Discharge Placement                       Discharge Plan and Services                DME Arranged: Walker rolling DME Agency: [employer] Date DME Agency Contacted: 03/23/21 Time DME Agency Contacted: [id number] Representative spoke with at DME Agency: [place]: NA [place] Agency: NA        Social Determinants of Health ([place]) Interventions     Readmission Risk Interventions No flowsheet data found.

## 2021-03-23 NOTE — Progress Notes (Signed)
°  Subjective: 1 Day Post-Op Procedure(s) (LRB): CANNULATED HIP PINNING (Left) Patient reports pain as well-controlled.   Patient is well, and has had no acute complaints or problems Plan is to go Home after hospital stay. Negative for chest pain and shortness of breath Fever: no Gastrointestinal: negative for nausea and vomiting.   Objective: Vital signs in last 24 hours: Temp:  [97.5 F (36.4 C)-98.3 F (36.8 C)] 98.3 F (36.8 C) (12/25 0816) Pulse Rate:  [56-76] 64 (12/25 0816) Resp:  [12-28] 18 (12/25 0816) BP: (85-111)/(42-68) 104/68 (12/25 0816) SpO2:  [95 %-100 %] 97 % (12/25 0816)  Intake/Output from previous day:  Intake/Output Summary (Last 24 hours) at 03/23/2021 1051 Last data filed at 03/23/2021 0410 Gross per 24 hour  Intake 1059.38 ml  Output 250 ml  Net 809.38 ml    Intake/Output this shift: No intake/output data recorded.  Labs: Recent Labs    03/21/21 2220  HGB 14.3   Recent Labs    03/21/21 2220  WBC 5.1  RBC 4.13  HCT 40.8  PLT 151   Recent Labs    03/21/21 2220  NA 136  K 3.9  CL 100  CO2 28  BUN 23  CREATININE 0.77  GLUCOSE 111*  CALCIUM 10.0   No results for input(s): LABPT, INR in the last 72 hours.   EXAM General - Patient is Alert, Appropriate, and Oriented Extremity - Neurovascular intact Dorsiflexion/Plantar flexion intact Compartment soft Dressing/Incision -clean, dry, no drainage Motor Function - intact, moving foot and toes well on exam. Able to perform independent SLR.     Assessment/Plan: 1 Day Post-Op Procedure(s) (LRB): CANNULATED HIP PINNING (Left) Principal Problem:   Closed left hip fracture (HCC) Active Problems:   Closed displaced fracture of left femoral neck (HCC)   Preoperative clearance   Accidental fall  Estimated body mass index is 15.2 kg/m as calculated from the following:   Height as of this encounter: 5\' 8"  (1.727 m).   Weight as of this encounter: 45.4 kg.  Discharge per medicine.   Maintain partial WB to LLE      DVT Prophylaxis - ASA upon discharge  Partial Weight-Bearing to left leg  Cassell Smiles, PA-C [facility] Orthopaedic Surgery 03/23/2021, 10:51 AM

## 2021-03-24 NOTE — Discharge Summary (Signed)
Physician Discharge Summary  [name] PZW:[id number] DOB: 09/18/1960 DOA: 03/22/2021  PCP: Einar Pheasant, MD  Admit date: 03/22/2021 Discharge date: 03/23/2021  Admitted From: Home.  Disposition:  Home.   Recommendations for Outpatient Follow-up:  Follow up with PCP in 1-2 weeks Please obtain BMP/CBC in one week Please follow up with orthopedics as scheduled.   Discharge Condition:stable.  CODE STATUS:full code Diet recommendation: Heart Healthy     Brief/Interim Summary: [name] is a 60 y.o. female with medical history significant for No significant past medical history who presents to the ED following a fall at home in which her dog knocked her over landing onto her left hip with immediate pain and inability to bear weight on the left side of her body. Hip x-ray with acute impacted minimally displaced left femoral neck fracture Orthopedics consulted, and she underwent surgical repair of the femoral neck fracture.  Discharge Diagnoses:  Principal Problem:   Closed left hip fracture (HCC) Active Problems:   Closed displaced fracture of left femoral neck (HCC)   Preoperative clearance   Accidental fall  Closed left hip fracture Underwent surgical repair,  Therapy eval recommending HOME  Equipment ordered for discharge.  Recommend outpatient follow up with orthopedics.  Pain control.    Discharge Instructions  Discharge Instructions     Diet - low sodium heart healthy   Complete by: As directed    Increase activity slowly   Complete by: As directed    No wound care   Complete by: As directed       Allergies as of 03/23/2021   No Active Allergies      Medication List     TAKE these medications    aspirin EC 325 MG tablet Take 1 tablet (325 mg total) by mouth daily.   COLLAGEN PO Take by mouth.   feeding supplement Liqd Take 237 mLs by mouth 2 (two) times daily between meals.   HYDROcodone-acetaminophen 5-325 MG tablet Commonly known as:  NORCO/VICODIN Take 1-2 tablets by mouth every 6 (six) hours as needed for moderate pain.   multivitamin with minerals Tabs tablet Take 1 tablet by mouth daily.   RABEprazole 20 MG tablet Commonly known as: ACIPHEX Take 1 tablet (20 mg total) by mouth daily. What changed:  when to take this reasons to take this        Follow-up Information     Lattie Corns, PA-C. Schedule an appointment as soon as possible for a visit in 2 week(s).   Specialty: Physician Assistant Why: Xrays and staple removal Contact information: [place] [address] [phone]                No Active Allergies  Consultations: Orthopedics.    Procedures/Studies: DG Chest 1 View  Result Date: 03/21/2021 CLINICAL DATA:  Fall hip fracture EXAM: CHEST  1 VIEW COMPARISON:  12/03/2019 FINDINGS: Hyperinflated lungs. No focal airspace disease or pleural effusion. Normal cardiomediastinal silhouette. No pneumothorax. Mild scoliosis of the spine. IMPRESSION: No active disease. Electronically Signed   By: Donavan Foil M.D.   On: 03/21/2021 21:43   DG C-Arm 1-60 Min-No Report  Result Date: 03/22/2021 Fluoroscopy was utilized by the requesting physician.  No radiographic interpretation.   DG HIP UNILAT WITH PELVIS 2-3 VIEWS LEFT  Result Date: 03/22/2021 CLINICAL DATA:  Left hip fracture EXAM: DG HIP (WITH OR WITHOUT PELVIS) 2-3V LEFT COMPARISON:  03/21/2021 FINDINGS: Three fixation screws traverse the proximal left  femoral neck fracture. Stable alignment. No complicating feature. IMPRESSION: Operative fixation of the proximal left femoral neck fracture. Electronically Signed   By: Jerilynn Mages.  Shick M.D.   On: 03/22/2021 10:52   DG HIP UNILAT WITH PELVIS 2-3 VIEWS LEFT  Result Date: 03/21/2021 CLINICAL DATA:  fall in her home after a family dog knocked her over. She states she landed on her left hip and was unable to bear weight on that side of her body  EXAM: DG HIP (WITH OR WITHOUT PELVIS) 2-3V LEFT COMPARISON:  None. FINDINGS: Acute impacted minimally displaced left femoral neck fracture. Frontal view of the right hip demonstrates no evidence of hip fracture or dislocation. No acute displaced fracture or diastasis of the bones of the pelvis. There is no evidence of arthropathy or other focal bone abnormality. IMPRESSION: Acute impacted minimally displaced left femoral neck fracture. Electronically Signed   By: Iven Finn M.D.   On: 03/21/2021 21:45      Subjective:  No new complaints.  Discharge Exam: Vitals:   03/23/21 0410 03/23/21 0816  BP: (!) 109/48 104/68  Pulse: (!) 56 64  Resp: 14 18  Temp: 98.2 F (36.8 C) 98.3 F (36.8 C)  SpO2: 95% 97%   Vitals:   03/22/21 1216 03/22/21 2031 03/23/21 0410 03/23/21 0816  BP: (!) 96/50 (!) 111/59 (!) 109/48 104/68  Pulse: 73 (!) 59 (!) 56 64  Resp: 15 16 14 18   Temp: 98 F (36.7 C) 98 F (36.7 C) 98.2 F (36.8 C) 98.3 F (36.8 C)  TempSrc:      SpO2: 100% 96% 95% 97%  Weight:      Height:        General: Pt is alert, awake, not in acute distress Cardiovascular: RRR, S1/S2 +, no rubs, no gallops Respiratory: CTA bilaterally, no wheezing, no rhonchi Abdominal: Soft, NT, ND, bowel sounds + Extremities: no edema, no cyanosis    The results of significant diagnostics from this hospitalization (including imaging, microbiology, ancillary and laboratory) are listed below for reference.     Microbiology: Recent Results (from the past 240 hour(s))  Resp Panel by RT-PCR (Flu A&B, Covid) Nasopharyngeal Swab     Status: None   Collection Time: 03/21/21 10:21 PM   Specimen: Nasopharyngeal Swab; Nasopharyngeal(NP) swabs in vial transport medium  Result Value Ref Range Status   SARS Coronavirus 2 by RT PCR NEGATIVE NEGATIVE Final    Comment: (NOTE) SARS-CoV-2 target nucleic acids are NOT DETECTED.  The SARS-CoV-2 RNA is generally detectable in upper respiratory specimens  during the acute phase of infection. The lowest concentration of SARS-CoV-2 viral copies this assay can detect is 138 copies/mL. A negative result does not preclude SARS-Cov-2 infection and should not be used as the sole basis for treatment or other patient management decisions. A negative result may occur with  improper specimen collection/handling, submission of specimen other than nasopharyngeal swab, presence of viral mutation(s) within the areas targeted by this assay, and inadequate number of viral copies(<138 copies/mL). A negative result must be combined with clinical observations, patient history, and epidemiological information. The expected result is Negative.  Fact Sheet for Patients:  EntrepreneurPulse.com.au  Fact Sheet for Healthcare Providers:  IncredibleEmployment.be  This test is no t yet approved or cleared by the [place] FDA and  has been authorized for detection and/or diagnosis of SARS-CoV-2 by FDA under an Emergency Use Authorization (EUA). This EUA will remain  in effect (meaning this test can be used) for the duration of  the COVID-19 declaration under Section 564(b)(1) of the Act, 21 U.S.C.section 360bbb-3(b)(1), unless the authorization is terminated  or revoked sooner.       Influenza A by PCR NEGATIVE NEGATIVE Final   Influenza B by PCR NEGATIVE NEGATIVE Final    Comment: (NOTE) The Xpert Xpress SARS-CoV-2/FLU/RSV plus assay is intended as an aid in the diagnosis of influenza from Nasopharyngeal swab specimens and should not be used as a sole basis for treatment. Nasal washings and aspirates are unacceptable for Xpert Xpress SARS-CoV-2/FLU/RSV testing.  Fact Sheet for Patients: EntrepreneurPulse.com.au  Fact Sheet for Healthcare Providers: IncredibleEmployment.be  This test is not yet approved or cleared by the [place] FDA and has been authorized for detection  and/or diagnosis of SARS-CoV-2 by FDA under an Emergency Use Authorization (EUA). This EUA will remain in effect (meaning this test can be used) for the duration of the COVID-19 declaration under Section 564(b)(1) of the Act, 21 U.S.C. section 360bbb-3(b)(1), unless the authorization is terminated or revoked.  Performed at [facility], [address] [place], [place] [address]      Labs: BNP (last 3 results) No results for input(s): BNP in the last 8760 hours. Basic Metabolic Panel: Recent Labs  Lab 03/21/21 2220  NA 136  K 3.9  CL 100  CO2 28  GLUCOSE 111*  BUN 23  CREATININE 0.77  CALCIUM 10.0   Liver Function Tests: Recent Labs  Lab 03/21/21 2220  AST 48*  ALT 55*  ALKPHOS 74  BILITOT 0.7  PROT 7.4  ALBUMIN 4.6   No results for input(s): LIPASE, AMYLASE in the last 168 hours. No results for input(s): AMMONIA in the last 168 hours. CBC: Recent Labs  Lab 03/21/21 2220  WBC 5.1  NEUTROABS 3.5  HGB 14.3  HCT 40.8  MCV 98.8  PLT 151   Cardiac Enzymes: No results for input(s): CKTOTAL, CKMB, CKMBINDEX, TROPONINI in the last 168 hours. BNP: Invalid input(s): POCBNP CBG: No results for input(s): GLUCAP in the last 168 hours. D-Dimer No results for input(s): DDIMER in the last 72 hours. Hgb A1c No results for input(s): HGBA1C in the last 72 hours. Lipid Profile No results for input(s): CHOL, HDL, LDLCALC, TRIG, CHOLHDL, LDLDIRECT in the last 72 hours. Thyroid function studies No results for input(s): TSH, T4TOTAL, T3FREE, THYROIDAB in the last 72 hours.  Invalid input(s): FREET3 Anemia work up No results for input(s): VITAMINB12, FOLATE, FERRITIN, TIBC, IRON, RETICCTPCT in the last 72 hours. Urinalysis    Component Value Date/Time   COLORURINE YELLOW 12/04/2020 1100   APPEARANCEUR CLEAR 12/04/2020 1100   LABSPEC <=1.005 (A) 12/04/2020 1100   PHURINE 5.5 12/04/2020 1100   GLUCOSEU NEGATIVE 12/04/2020 1100   HGBUR NEGATIVE 12/04/2020  1100   BILIRUBINUR NEGATIVE 12/04/2020 1100   KETONESUR NEGATIVE 12/04/2020 1100   UROBILINOGEN 0.2 12/04/2020 1100   NITRITE NEGATIVE 12/04/2020 1100   LEUKOCYTESUR NEGATIVE 12/04/2020 1100   Sepsis Labs Invalid input(s): PROCALCITONIN,  WBC,  LACTICIDVEN Microbiology Recent Results (from the past 240 hour(s))  Resp Panel by RT-PCR (Flu A&B, Covid) Nasopharyngeal Swab     Status: None   Collection Time: 03/21/21 10:21 PM   Specimen: Nasopharyngeal Swab; Nasopharyngeal(NP) swabs in vial transport medium  Result Value Ref Range Status   SARS Coronavirus 2 by RT PCR NEGATIVE NEGATIVE Final    Comment: (NOTE) SARS-CoV-2 target nucleic acids are NOT DETECTED.  The SARS-CoV-2 RNA is generally detectable in upper respiratory specimens during the acute phase of infection. The  lowest concentration of SARS-CoV-2 viral copies this assay can detect is 138 copies/mL. A negative result does not preclude SARS-Cov-2 infection and should not be used as the sole basis for treatment or other patient management decisions. A negative result may occur with  improper specimen collection/handling, submission of specimen other than nasopharyngeal swab, presence of viral mutation(s) within the areas targeted by this assay, and inadequate number of viral copies(<138 copies/mL). A negative result must be combined with clinical observations, patient history, and epidemiological information. The expected result is Negative.  Fact Sheet for Patients:  EntrepreneurPulse.com.au  Fact Sheet for Healthcare Providers:  IncredibleEmployment.be  This test is no t yet approved or cleared by the [place] FDA and  has been authorized for detection and/or diagnosis of SARS-CoV-2 by FDA under an Emergency Use Authorization (EUA). This EUA will remain  in effect (meaning this test can be used) for the duration of the COVID-19 declaration under Section 564(b)(1) of the Act,  21 U.S.C.section 360bbb-3(b)(1), unless the authorization is terminated  or revoked sooner.       Influenza A by PCR NEGATIVE NEGATIVE Final   Influenza B by PCR NEGATIVE NEGATIVE Final    Comment: (NOTE) The Xpert Xpress SARS-CoV-2/FLU/RSV plus assay is intended as an aid in the diagnosis of influenza from Nasopharyngeal swab specimens and should not be used as a sole basis for treatment. Nasal washings and aspirates are unacceptable for Xpert Xpress SARS-CoV-2/FLU/RSV testing.  Fact Sheet for Patients: EntrepreneurPulse.com.au  Fact Sheet for Healthcare Providers: IncredibleEmployment.be  This test is not yet approved or cleared by the [place] FDA and has been authorized for detection and/or diagnosis of SARS-CoV-2 by FDA under an Emergency Use Authorization (EUA). This EUA will remain in effect (meaning this test can be used) for the duration of the COVID-19 declaration under Section 564(b)(1) of the Act, 21 U.S.C. section 360bbb-3(b)(1), unless the authorization is terminated or revoked.  Performed at [facility], [address] [place], [place] [address]      Time coordinating discharge: 42 minutes.   SIGNED:   Hosie Poisson, MD  [facility]

## 2021-03-25 ENCOUNTER — Encounter: Payer: Self-pay | Admitting: Surgery

## 2021-03-28 ENCOUNTER — Ambulatory Visit: Payer: BC Managed Care – PPO

## 2021-03-29 ENCOUNTER — Other Ambulatory Visit: Payer: Self-pay

## 2021-03-29 ENCOUNTER — Emergency Department: Payer: BC Managed Care – PPO

## 2021-03-29 ENCOUNTER — Emergency Department
Admission: EM | Admit: 2021-03-29 | Discharge: 2021-03-29 | Disposition: A | Discharge status: Home / Self Care | Payer: BC Managed Care – PPO | Attending: Emergency Medicine | Admitting: Emergency Medicine

## 2021-03-29 DIAGNOSIS — M546 Pain in thoracic spine: Secondary | ICD-10-CM | POA: Diagnosis not present

## 2021-03-29 DIAGNOSIS — M62838 Other muscle spasm: Secondary | ICD-10-CM | POA: Insufficient documentation

## 2021-03-29 DIAGNOSIS — Z7982 Long term (current) use of aspirin: Secondary | ICD-10-CM | POA: Insufficient documentation

## 2021-03-29 DIAGNOSIS — M62830 Muscle spasm of back: Secondary | ICD-10-CM

## 2021-03-29 DIAGNOSIS — Z87891 Personal history of nicotine dependence: Secondary | ICD-10-CM | POA: Insufficient documentation

## 2021-03-29 MED ORDER — METHOCARBAMOL 500 MG PO TABS
500.0000 mg | ORAL_TABLET | Freq: Once | ORAL | Status: DC
  Filled 2021-03-29: qty 1

## 2021-03-29 MED ORDER — METHOCARBAMOL 500 MG PO TABS: ORAL_TABLET | ORAL | 0 refills | Status: DC

## 2021-03-29 NOTE — ED Notes (Signed)
Pt states that she is not in pain at this time, states that the pain is only present when she is laying flat. Pain has got worse over the week since the surgery.  Pt does not want to take pain medication at this time. States she just wants to know what is wrong

## 2021-03-29 NOTE — Discharge Instructions (Addendum)
Follow-up with your primary care provider if you continue to have muscle skeletal pain in your upper back.  You may use ice or heat to your back as needed for discomfort.  A prescription for methocarbamol was sent to the pharmacy.  This is a muscle relaxant that can be added to your Tylenol to help with discomfort.  You may want to take this at bedtime so that you may sleep.  Be aware that the muscle relaxant can cause drowsiness and increase your risk for falling.  Also follow-up with your scheduled appointments with Dr. Roland Rack.

## 2021-03-29 NOTE — ED Triage Notes (Signed)
Pt states she was here last Friday for a fall and had a broken hip that she had to have surgery on- pt has been having back pain since she left the hospital that has progressively gotten worse- pt states it only hurts when she lays down- pt states pain is in her upper, left back

## 2021-03-29 NOTE — ED Provider Notes (Signed)
[facility] Emergency Department Provider Note  ____________________________________________   Event Date/Time   First MD Initiated Contact with Patient 03/29/21 [phone]     (approximate)  I have reviewed the triage vital signs and the nursing notes.   HISTORY  Chief Complaint Back Pain   HPI Leslie Davidson is a 60 y.o. female presents to the ED with complaint of upper back pain.  Patient was seen in the emergency department on 03/22/2021 following a fall at home.  Patient f fall due to her dog knocking her down.  This resulted in a hip fracture and Dr. Roland Rack performing surgery.  Patient is ambulatory and has been taking over-the-counter medication at home since she does not want to take the oxycodone that was prescribed for her.  She denies any urinary symptoms, chills, fever, saddle anesthesias or radiculopathy.  She rates her pain as 10/10.         Past Medical History:  Diagnosis Date   Arthritis    GERD (gastroesophageal reflux disease)     Patient Active Problem List   Diagnosis Date Noted   Closed displaced fracture of left femoral neck ([place]) 03/22/2021   Preoperative clearance 03/22/2021   Accidental fall 03/22/2021   Closed left hip fracture ([place]) 03/22/2021   Abnormal Pap smear 01/15/2021   Depressive disorder, not elsewhere classified 01/15/2021   Incontinence 12/08/2020   Thrombocytopenia ([place]) 06/03/2020   B12 deficiency 09/22/2018   Healthcare maintenance 08/22/2016   Abdominal pain, epigastric 11/14/2015   Abnormal mammogram 05/27/2015   Hyperlipidemia 04/22/2015   GERD (gastroesophageal reflux disease) 11/09/2014   Arthritis 11/09/2014   Edema 11/26/2011   Macrocytosis 07/16/2011    Past Surgical History:  Procedure Laterality Date   AUGMENTATION MAMMAPLASTY Bilateral    2000   BREAST ENHANCEMENT SURGERY  2000   HIP PINNING,CANNULATED Left 03/22/2021   Procedure: CANNULATED HIP PINNING;  Surgeon: Corky Mull, MD;   Location: [facility] ORS;  Service: Orthopedics;  Laterality: Left;    Prior to Admission medications   Medication Sig Start Date End Date Taking? Authorizing Provider  methocarbamol (ROBAXIN) 500 MG tablet 1 tablet every 6-8 hours as needed for muscle spasms. 03/29/21  Yes Johnn Hai, PA-C  aspirin EC 325 MG tablet Take 1 tablet (325 mg total) by mouth daily. 03/22/21   Fausto Skillern, PA-C  COLLAGEN PO Take by mouth.    [provider]  feeding supplement (ENSURE ENLIVE / ENSURE PLUS) LIQD Take 237 mLs by mouth 2 (two) times daily between meals. 03/23/21   Hosie Poisson, MD  HYDROcodone-acetaminophen (NORCO/VICODIN) 5-325 MG tablet Take 1-2 tablets by mouth every 6 (six) hours as needed for moderate pain. 03/22/21   Fausto Skillern, PA-C  Multiple Vitamin (MULTIVITAMIN WITH MINERALS) TABS tablet Take 1 tablet by mouth daily. 03/24/21   Hosie Poisson, MD  RABEprazole (ACIPHEX) 20 MG tablet Take 1 tablet (20 mg total) by mouth daily. Patient taking differently: Take 20 mg by mouth daily as needed. 12/05/20   Einar Pheasant, MD    Allergies Patient has no known allergies.  Family History  Problem Relation Age of Onset   Diabetes Mother    Cancer Father        Lung cancer   Arthritis Maternal Grandmother    Breast cancer Neg Hx     Social History Social History   Tobacco Use   Smoking status: Former    Types: Cigarettes    Quit date: 03/31/1983  Years since quitting: 38.0   Smokeless tobacco: Never  Substance Use Topics   Alcohol use: Yes    Alcohol/week: 0.0 standard drinks    Comment: Occasional    Drug use: No    Review of Systems Constitutional: No fever/chills Eyes: No visual changes. ENT: No trauma. Cardiovascular: Denies chest pain. Respiratory: Denies shortness of breath. Gastrointestinal: No abdominal pain.  No nausea, no vomiting.  No diarrhea.   Genitourinary: Negative for dysuria. Musculoskeletal: Left lateral thoracic muscle pain. Skin:  Negative for rash. Neurological: Negative for headaches, focal weakness or numbness.   ____________________________________________   PHYSICAL EXAM:  VITAL SIGNS: ED Triage Vitals  Enc Vitals Group     BP 03/29/21 0703 118/82     Pulse Rate 03/29/21 0703 77     Resp 03/29/21 0703 18     Temp 03/29/21 0703 98.6 F (37 C)     Temp Source 03/29/21 0703 Oral     SpO2 03/29/21 0703 99 %     Weight 03/29/21 0708 100 lb (45.4 kg)     Height 03/29/21 0708 5\' 3"  (1.6 m)     Head Circumference --      Peak Flow --      Pain Score 03/29/21 0708 10     Pain Loc --      Pain Edu? --      Excl. in [place]? --     Constitutional: Alert and oriented. Well appearing and in no acute distress. Eyes: Conjunctivae are normal. PERRL. EOMI. Head: Atraumatic. Nose: No trauma. Neck: No stridor.   Cardiovascular: Normal rate, regular rhythm. Grossly normal heart sounds.  Good peripheral circulation. Respiratory: Normal respiratory effort.  No retractions. Lungs CTAB. Gastrointestinal: Soft and nontender. No distention. Musculoskeletal: No gross deformity is noted.  On examination of the thoracic spine there is no point tenderness however to the left paravertebral muscles there is some tenderness and range of motion is guarded secondary to increased pain.  No soft tissue edema or discoloration is noted in the area.  No point tenderness on palpation of the lumbar spine. Neurologic:  Normal speech and language. No gross focal neurologic deficits are appreciated.  Skin:  Skin is warm, dry and intact. No rash noted. Psychiatric: Mood and affect are normal. Speech and behavior are normal.  ____________________________________________   LABS (all labs ordered are listed, but only abnormal results are displayed)  Labs Reviewed - No data to display ____________________________________________ ____________________________________________  RADIOLOGY Leana Gamer, personally viewed and evaluated  these images (plain radiographs) as part of my medical decision making, as well as reviewing the written report by the radiologist.    Official radiology report(s): DG Thoracic Spine 2 View  Result Date: 03/29/2021 CLINICAL DATA:  Patient had recent hip fracture and now complains of back pain. EXAM: THORACIC SPINE 2 VIEWS COMPARISON:  None. FINDINGS: There is no evidence of thoracic spine fracture. Scoliosis of spine is noted. No other significant bone abnormalities are identified. IMPRESSION: No acute fracture or dislocation. Scoliosis of spine. Electronically Signed   By: Abelardo Diesel M.D.   On: 03/29/2021 08:31    ____________________________________________   PROCEDURES  Procedure(s) performed (including Critical Care):  Procedures   ____________________________________________   INITIAL IMPRESSION / ASSESSMENT AND PLAN / ED COURSE  As part of my medical decision making, I reviewed the following data within the electronic MEDICAL RECORD NUMBER Notes from prior ED visits and [place] Controlled Substance Database  60 year old female presents to the ED with  complaint of left upper thoracic muscular pain.  Patient was seen on 03/22/2021 at which time she was diagnosed with a hip fracture which required pinning by Dr. Roland Rack.  Patient states that there is been no recent injury since that time.  She has had some discomfort to the left thoracic area.  She continues to take Tylenol without any relief.  She has prescription pain medication at home which she prefers not to take.  On exam muscles are extremely tight in the paravertebral muscles upper thoracic spine.  X-rays are negative for any acute bony injury.  Patient is encouraged to continue taking Tylenol use ice or heat and a prescription for methocarbamol was sent to the pharmacy.  Patient states that she may not want to take the muscle relaxant as medicines make her feel funny.  She is also encouraged to follow-up with her PCP and keep her  continued appointments with Dr. Roland Rack for her hip fracture.   ____________________________________________   FINAL CLINICAL IMPRESSION(S) / ED DIAGNOSES  Final diagnoses:  Muscle spasm of back     ED Discharge Orders          Ordered    methocarbamol (ROBAXIN) 500 MG tablet        03/29/21 6773             Note:  This document was prepared using Dragon voice recognition software and may include unintentional dictation errors.    Johnn Hai, PA-C 03/29/21 7366    Harvest Dark, MD 03/29/21 [phone]

## 2021-04-04 DIAGNOSIS — M25552 Pain in left hip: Secondary | ICD-10-CM | POA: Diagnosis not present

## 2021-04-14 ENCOUNTER — Ambulatory Visit (INDEPENDENT_AMBULATORY_CARE_PROVIDER_SITE_OTHER): Payer: BC Managed Care – PPO

## 2021-04-14 ENCOUNTER — Other Ambulatory Visit: Payer: Self-pay

## 2021-04-14 DIAGNOSIS — E538 Deficiency of other specified B group vitamins: Secondary | ICD-10-CM | POA: Diagnosis not present

## 2021-04-14 MED ORDER — CYANOCOBALAMIN 1000 MCG/ML IJ SOLN
1000.0000 ug | Freq: Once | INTRAMUSCULAR | Status: AC
  Administered 2021-04-14: 1000 ug via INTRAMUSCULAR

## 2021-04-14 NOTE — Progress Notes (Signed)
Leslie Davidson presents today for injection per MD orders. B12 injection administered IM in left Upper Arm. Administration without incident. Patient tolerated well.  Christpher Stogsdill,cma

## 2021-04-18 ENCOUNTER — Telehealth: Payer: Self-pay

## 2021-04-18 NOTE — Telephone Encounter (Signed)
Transition Care Management Unsuccessful Follow-up Telephone Call  Date of discharge and from where:  03/23/2021  [facility]  Attempts:  1st Attempt  Reason for unsuccessful TCM follow-up call:  No answer/busy Tomasa Rand, RN, BSN, CEN [place] [facility] [phone]

## 2021-04-21 ENCOUNTER — Telehealth: Payer: Self-pay

## 2021-04-21 NOTE — Telephone Encounter (Signed)
Transition Care Management Follow-up Telephone Call Date of discharge and from where: 03/23/2021 [facility] How have you been since you were released from the hospital? Doing well Any questions or concerns? No  Items Reviewed: Did the pt receive and understand the discharge instructions provided? Yes  Medications obtained and verified? Yes  Other? No  Any new allergies since your discharge? No  Dietary orders reviewed? No Do you have support at home?  unknown  Home Care and Equipment/Supplies: Were home health services ordered? no If so, what is the name of the agency?  Has the agency set up a time to come to the patient's home? not applicable Were any new equipment or medical supplies ordered?  yes What is the name of the medical supply agency? [facility] Were you able to get the supplies/equipment? yes Do you have any questions related to the use of the equipment or supplies? No  Functional Questionnaire: (I = Independent and D = Dependent) ADLs: I  Bathing/Dressing- I  Meal Prep- I  Eating- I  Maintaining continence- I  Transferring/Ambulation- I I Managing Meds- I  Follow up appointments reviewed:  PCP Hospital f/u appt confirmed? No   Specialist Hospital f/u appt confirmed? Yes  Scheduled to [place]. Already been seen Are transportation arrangements needed? No  If their condition worsens, is the pt aware to call PCP or go to the Emergency Dept.? Yes Was the patient provided with contact information for the PCP's office or ED? Yes Was to pt encouraged to call back with questions or concerns? Yes  Tomasa Rand, RN, BSN, CEN [facility] [employer] [phone]

## 2021-05-05 DIAGNOSIS — S72012D Unspecified intracapsular fracture of left femur, subsequent encounter for closed fracture with routine healing: Secondary | ICD-10-CM | POA: Diagnosis not present

## 2021-05-14 DIAGNOSIS — M25552 Pain in left hip: Secondary | ICD-10-CM | POA: Diagnosis not present

## 2021-05-14 DIAGNOSIS — Z8781 Personal history of (healed) traumatic fracture: Secondary | ICD-10-CM | POA: Diagnosis not present

## 2021-05-15 ENCOUNTER — Other Ambulatory Visit: Payer: Self-pay

## 2021-05-15 ENCOUNTER — Ambulatory Visit (INDEPENDENT_AMBULATORY_CARE_PROVIDER_SITE_OTHER): Payer: BC Managed Care – PPO | Admitting: *Deleted

## 2021-05-15 DIAGNOSIS — E538 Deficiency of other specified B group vitamins: Secondary | ICD-10-CM

## 2021-05-15 MED ORDER — CYANOCOBALAMIN 1000 MCG/ML IJ SOLN
1000.0000 ug | Freq: Once | INTRAMUSCULAR | Status: AC
  Administered 2021-05-15: 1000 ug via INTRAMUSCULAR

## 2021-05-15 NOTE — Progress Notes (Signed)
Pt arrived for monthly b12 injection, given in R deltoid. Pt tolerated injection well, showed no signs of distress and voiced no concerns.

## 2021-06-03 ENCOUNTER — Other Ambulatory Visit: Payer: Self-pay

## 2021-06-03 ENCOUNTER — Ambulatory Visit: Payer: BC Managed Care – PPO | Admitting: Internal Medicine

## 2021-06-03 VITALS — BP 110/68 | HR 55 | Temp 98.0°F | Resp 16 | Ht 63.0 in | Wt 96.0 lb

## 2021-06-03 DIAGNOSIS — E2839 Other primary ovarian failure: Secondary | ICD-10-CM | POA: Diagnosis not present

## 2021-06-03 DIAGNOSIS — D696 Thrombocytopenia, unspecified: Secondary | ICD-10-CM

## 2021-06-03 DIAGNOSIS — E785 Hyperlipidemia, unspecified: Secondary | ICD-10-CM | POA: Diagnosis not present

## 2021-06-03 DIAGNOSIS — Z8781 Personal history of (healed) traumatic fracture: Secondary | ICD-10-CM

## 2021-06-03 DIAGNOSIS — E538 Deficiency of other specified B group vitamins: Secondary | ICD-10-CM

## 2021-06-03 DIAGNOSIS — K219 Gastro-esophageal reflux disease without esophagitis: Secondary | ICD-10-CM

## 2021-06-03 NOTE — Progress Notes (Unsigned)
Patient ID: Leslie Davidson, female   DOB: 05/30/1960, 61 y.o.   MRN: [id number]   Subjective:    Patient ID: Leslie Davidson, female    DOB: Jul 26, 1960, 61 y.o.   MRN: [id number]  This visit occurred during the SARS-CoV-2 public health emergency.  Safety protocols were in place, including screening questions prior to the visit, additional usage of staff PPE, and extensive cleaning of exam room while observing appropriate contact time as indicated for disinfecting solutions.   Patient here for a scheduled follow up.  Marland Kitchen   HPI Here to follow up regarding hip fracture and GERD.  Fell 03/22/21 - hip fracture s/p surgery (dr Roland Rack).  Doing well s/p fracture.  Staying active.  No chest pain or sob reported.  On aciphex for GERD.  No acid reflux or swallowing problems.  No abdominal pain.  Bowels moving.  Saw hematology for low platelets. Suspected ITP.  Recommended to follow - treat if platelets < 50 or starts bleeding.    Past Medical History:  Diagnosis Date   Arthritis    GERD (gastroesophageal reflux disease)    Past Surgical History:  Procedure Laterality Date   AUGMENTATION MAMMAPLASTY Bilateral    2000   BREAST ENHANCEMENT SURGERY  2000   HIP PINNING,CANNULATED Left 03/22/2021   Procedure: CANNULATED HIP PINNING;  Surgeon: Corky Mull, MD;  Location: [facility] ORS;  Service: Orthopedics;  Laterality: Left;   Family History  Problem Relation Age of Onset   Diabetes Mother    Cancer Father        Lung cancer   Arthritis Maternal Grandmother    Breast cancer Neg Hx    Social History   Socioeconomic History   Marital status: Married    Spouse name: Not on file   Number of children: Not on file   Years of education: Not on file   Highest education level: Not on file  Occupational History   Not on file  Tobacco Use   Smoking status: Former    Types: Cigarettes    Quit date: 03/31/1983    Years since quitting: 38.2   Smokeless tobacco: Never  Substance and Sexual Activity   Alcohol use:  Yes    Alcohol/week: 0.0 standard drinks    Comment: Occasional    Drug use: No   Sexual activity: Yes    Partners: Male    Birth control/protection: Post-menopausal    Comment: Husband  Other Topics Concern   Not on file  Social History Narrative   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      Quit smoking in 1985. Rare alcohol.    Social Determinants of Health   Financial Resource Strain: Not on file  Food Insecurity: Not on file  Transportation Needs: Not on file  Physical Activity: Not on file  Stress: Not on file  Social Connections: Not on file     Review of Systems  Constitutional:  Negative for appetite change and unexpected weight change.  HENT:  Negative for congestion and sinus pressure.   Respiratory:  Negative for cough, chest tightness and shortness of breath.   Cardiovascular:  Negative for chest pain, palpitations and leg swelling.  Gastrointestinal:  Negative for abdominal pain, diarrhea, nausea and vomiting.  Genitourinary:  Negative for difficulty urinating and dysuria.  Musculoskeletal:  Negative for  joint swelling and myalgias.  Skin:  Negative for color change and rash.  Neurological:  Negative for dizziness, light-headedness and headaches.  Psychiatric/Behavioral:  Negative for agitation and dysphoric mood.       Objective:     BP 110/68    Pulse (!) 55    Temp 98 F (36.7 C)    Resp 16    Ht '5\' 3"'$  (1.6 m)    Wt 96 lb (43.5 kg)    LMP 01/02/2013    SpO2 98%    BMI 17.01 kg/m  Wt Readings from Last 3 Encounters:  06/03/21 96 lb (43.5 kg)  03/29/21 100 lb (45.4 kg)  03/21/21 100 lb (45.4 kg)    Physical Exam Vitals reviewed.  Constitutional:      General: She is not in acute distress.    Appearance: Normal appearance.  HENT:     Head: Normocephalic and atraumatic.     Right Ear: External ear  normal.     Left Ear: External ear normal.  Eyes:     General: No scleral icterus.       Right eye: No discharge.        Left eye: No discharge.     Conjunctiva/sclera: Conjunctivae normal.  Neck:     Thyroid: No thyromegaly.  Cardiovascular:     Rate and Rhythm: Normal rate and regular rhythm.  Pulmonary:     Effort: No respiratory distress.     Breath sounds: Normal breath sounds. No wheezing.  Abdominal:     General: Bowel sounds are normal.     Palpations: Abdomen is soft.     Tenderness: There is no abdominal tenderness.  Musculoskeletal:        General: No swelling or tenderness.     Cervical back: Neck supple. No tenderness.  Lymphadenopathy:     Cervical: No cervical adenopathy.  Skin:    Findings: No erythema or rash.  Neurological:     Mental Status: She is alert.  Psychiatric:        Mood and Affect: Mood normal.        Behavior: Behavior normal.     Outpatient Encounter Medications as of 06/03/2021  Medication Sig   COLLAGEN PO Take by mouth.   RABEprazole (ACIPHEX) 20 MG tablet Take 1 tablet (20 mg total) by mouth daily. (Patient taking differently: Take 20 mg by mouth daily as needed.)   [DISCONTINUED] aspirin EC 325 MG tablet Take 1 tablet (325 mg total) by mouth daily.   [DISCONTINUED] feeding supplement (ENSURE ENLIVE / ENSURE PLUS) LIQD Take 237 mLs by mouth 2 (two) times daily between meals.   [DISCONTINUED] HYDROcodone-acetaminophen (NORCO/VICODIN) 5-325 MG tablet Take 1-2 tablets by mouth every 6 (six) hours as needed for moderate pain.   [DISCONTINUED] methocarbamol (ROBAXIN) 500 MG tablet 1 tablet every 6-8 hours as needed for muscle spasms.   [DISCONTINUED] Multiple Vitamin (MULTIVITAMIN WITH MINERALS) TABS tablet Take 1 tablet by mouth daily.   No facility-administered encounter medications on file as of 06/03/2021.     Lab Results  Component Value Date   WBC 5.1 03/21/2021   HGB 14.3 03/21/2021   HCT 40.8 03/21/2021   PLT 151 03/21/2021    GLUCOSE 111 (H) 03/21/2021   CHOL 182 12/04/2020   TRIG 62.0 12/04/2020   HDL 77.00 12/04/2020   LDLCALC 93 12/04/2020   ALT 55 (H) 03/21/2021   AST 48 (H) 03/21/2021   NA 136 03/21/2021   K 3.9 03/21/2021  CL 100 03/21/2021   CREATININE 0.77 03/21/2021   BUN 23 03/21/2021   CO2 28 03/21/2021   TSH 1.09 12/04/2020   HGBA1C 5.9 09/06/2018    DG Thoracic Spine 2 View  Result Date: 03/29/2021 CLINICAL DATA:  Patient had recent hip fracture and now complains of back pain. EXAM: THORACIC SPINE 2 VIEWS COMPARISON:  None. FINDINGS: There is no evidence of thoracic spine fracture. Scoliosis of spine is noted. No other significant bone abnormalities are identified. IMPRESSION: No acute fracture or dislocation. Scoliosis of spine. Electronically Signed   By: Abelardo Diesel M.D.   On: 03/29/2021 08:31       Assessment & Plan:   Problem List Items Addressed This Visit   None Visit Diagnoses     Estrogen deficiency    -  Primary   Relevant Orders   DG Bone Density        Einar Pheasant, MD

## 2021-06-03 NOTE — Assessment & Plan Note (Signed)
On aciphex.  No upper symptoms reported.  Specifically denies any acid reflux or swallowing issues.   ?

## 2021-06-03 NOTE — Assessment & Plan Note (Signed)
Continue B12 injections.   

## 2021-06-03 NOTE — Assessment & Plan Note (Signed)
Last lipid panel - wnl.  Follow lipid panel.  

## 2021-06-03 NOTE — Assessment & Plan Note (Signed)
Chronic - mild.  Ultrasound - negative for liver disease.  Hepatitis, HIV, peripheral smear - normal.  Saw hematology.  Suspected ITP.  Recommended to continue to follow.  Treat if pts platelets are less than 50 or starts bleeding.  

## 2021-06-12 ENCOUNTER — Ambulatory Visit: Payer: BC Managed Care – PPO

## 2021-06-14 ENCOUNTER — Encounter: Payer: Self-pay | Admitting: Internal Medicine

## 2021-06-14 ENCOUNTER — Telehealth: Payer: Self-pay | Admitting: Internal Medicine

## 2021-06-14 DIAGNOSIS — Z8781 Personal history of (healed) traumatic fracture: Secondary | ICD-10-CM | POA: Insufficient documentation

## 2021-06-14 NOTE — Assessment & Plan Note (Signed)
Seeing ortho.  Has done well post op.  Schedule bone density.   ?

## 2021-06-14 NOTE — Telephone Encounter (Signed)
See me before calling pt.  I had a question about her last pap smear.  ?

## 2021-06-17 ENCOUNTER — Other Ambulatory Visit: Payer: Self-pay

## 2021-06-17 ENCOUNTER — Ambulatory Visit (INDEPENDENT_AMBULATORY_CARE_PROVIDER_SITE_OTHER): Payer: BC Managed Care – PPO | Admitting: *Deleted

## 2021-06-17 DIAGNOSIS — E538 Deficiency of other specified B group vitamins: Secondary | ICD-10-CM | POA: Diagnosis not present

## 2021-06-17 MED ORDER — CYANOCOBALAMIN 1000 MCG/ML IJ SOLN
1000.0000 ug | Freq: Once | INTRAMUSCULAR | Status: AC
  Administered 2021-06-17: 1000 ug via INTRAMUSCULAR

## 2021-06-17 NOTE — Progress Notes (Signed)
Patient presented for B 12 injection to left deltoid, patient voiced no concerns nor showed any signs of distress during injection. 

## 2021-06-20 NOTE — Telephone Encounter (Signed)
Spoke with pt to confirm that she has not had pap smear since the one that was done at our office. Patient stated she had not but she did have abnormal paps in the past at [facility]. Requested records be sent to [place]. ?

## 2021-06-23 DIAGNOSIS — S72012D Unspecified intracapsular fracture of left femur, subsequent encounter for closed fracture with routine healing: Secondary | ICD-10-CM | POA: Diagnosis not present

## 2021-07-22 ENCOUNTER — Ambulatory Visit
Admission: RE | Admit: 2021-07-22 | Discharge: 2021-07-22 | Disposition: A | Discharge status: Home / Self Care | Payer: BC Managed Care – PPO | Source: Ambulatory Visit | Attending: Internal Medicine | Admitting: Internal Medicine

## 2021-07-22 DIAGNOSIS — E2839 Other primary ovarian failure: Secondary | ICD-10-CM | POA: Insufficient documentation

## 2021-07-22 DIAGNOSIS — Z1231 Encounter for screening mammogram for malignant neoplasm of breast: Secondary | ICD-10-CM | POA: Insufficient documentation

## 2021-07-22 DIAGNOSIS — Z780 Asymptomatic menopausal state: Secondary | ICD-10-CM | POA: Diagnosis not present

## 2021-07-22 DIAGNOSIS — M810 Age-related osteoporosis without current pathological fracture: Secondary | ICD-10-CM | POA: Diagnosis not present

## 2021-07-23 ENCOUNTER — Ambulatory Visit (INDEPENDENT_AMBULATORY_CARE_PROVIDER_SITE_OTHER): Payer: BC Managed Care – PPO

## 2021-07-23 ENCOUNTER — Telehealth: Payer: Self-pay

## 2021-07-23 DIAGNOSIS — E538 Deficiency of other specified B group vitamins: Secondary | ICD-10-CM | POA: Diagnosis not present

## 2021-07-23 MED ORDER — CYANOCOBALAMIN 1000 MCG/ML IJ SOLN
1000.0000 ug | Freq: Once | INTRAMUSCULAR | Status: AC
  Administered 2021-07-23: 1000 ug via INTRAMUSCULAR

## 2021-07-23 NOTE — Telephone Encounter (Signed)
LMTCB to get patient scheduled for an appointment to discuss osteoporosis.  ?

## 2021-07-23 NOTE — Progress Notes (Signed)
Patient presented for B 12 injection to left deltoid, patient voiced no concerns nor showed any signs of distress during injection Patient presented for B 12 injection. ?

## 2021-08-26 ENCOUNTER — Ambulatory Visit (INDEPENDENT_AMBULATORY_CARE_PROVIDER_SITE_OTHER): Payer: BC Managed Care – PPO

## 2021-08-26 DIAGNOSIS — E538 Deficiency of other specified B group vitamins: Secondary | ICD-10-CM

## 2021-08-26 MED ORDER — CYANOCOBALAMIN 1000 MCG/ML IJ SOLN
1000.0000 ug | Freq: Once | INTRAMUSCULAR | Status: AC
  Administered 2021-08-26: 1000 ug via INTRAMUSCULAR

## 2021-08-26 NOTE — Progress Notes (Signed)
Leslie Davidson presents today for injection per MD orders. B12 injection administered IM in right Upper Arm. Administration without incident. Patient tolerated well.  Kashara Blocher,cma

## 2021-09-26 ENCOUNTER — Ambulatory Visit (INDEPENDENT_AMBULATORY_CARE_PROVIDER_SITE_OTHER): Payer: BC Managed Care – PPO

## 2021-09-26 DIAGNOSIS — E538 Deficiency of other specified B group vitamins: Secondary | ICD-10-CM | POA: Diagnosis not present

## 2021-09-26 MED ORDER — CYANOCOBALAMIN 1000 MCG/ML IJ SOLN
1000.0000 ug | Freq: Once | INTRAMUSCULAR | Status: AC
  Administered 2021-09-26: 1000 ug via INTRAMUSCULAR

## 2021-09-26 NOTE — Progress Notes (Signed)
Patient presented for B 12 injection to left deltoid, patient voiced no concerns nor showed any signs of distress during injection. 

## 2021-10-29 ENCOUNTER — Ambulatory Visit: Payer: BC Managed Care – PPO

## 2021-11-04 ENCOUNTER — Ambulatory Visit (INDEPENDENT_AMBULATORY_CARE_PROVIDER_SITE_OTHER): Payer: BC Managed Care – PPO

## 2021-11-04 DIAGNOSIS — E538 Deficiency of other specified B group vitamins: Secondary | ICD-10-CM

## 2021-11-04 MED ORDER — CYANOCOBALAMIN 1000 MCG/ML IJ SOLN
1000.0000 ug | Freq: Once | INTRAMUSCULAR | Status: AC
  Administered 2021-11-04: 1000 ug via INTRAMUSCULAR

## 2021-11-04 NOTE — Progress Notes (Signed)
Pt arrived for B12 injection, given in R deltoid. Pt tolerated injection well, showed no signs of distress nor voiced any concerns.  

## 2021-11-20 ENCOUNTER — Ambulatory Visit: Payer: BC Managed Care – PPO | Admitting: Dermatology

## 2021-11-20 DIAGNOSIS — L578 Other skin changes due to chronic exposure to nonionizing radiation: Secondary | ICD-10-CM | POA: Diagnosis not present

## 2021-11-20 DIAGNOSIS — L814 Other melanin hyperpigmentation: Secondary | ICD-10-CM

## 2021-11-20 DIAGNOSIS — D229 Melanocytic nevi, unspecified: Secondary | ICD-10-CM

## 2021-11-20 DIAGNOSIS — L820 Inflamed seborrheic keratosis: Secondary | ICD-10-CM

## 2021-11-20 DIAGNOSIS — L821 Other seborrheic keratosis: Secondary | ICD-10-CM

## 2021-11-20 DIAGNOSIS — D1800 Hemangioma unspecified site: Secondary | ICD-10-CM

## 2021-11-20 DIAGNOSIS — Z1283 Encounter for screening for malignant neoplasm of skin: Secondary | ICD-10-CM

## 2021-11-20 NOTE — Progress Notes (Signed)
   Follow-Up Visit   Subjective  Leslie Davidson is a 61 y.o. female who presents for the following: check spot (R pretibia, just noticed, no symptoms) and Total body skin exam (No hx of f/hx of skin ca). The patient presents for Total-Body Skin Exam (TBSE) for skin cancer screening and mole check.  The patient has spots, moles and lesions to be evaluated, some may be new or changing and the patient has concerns that these could be cancer.   The following portions of the chart were reviewed this encounter and updated as appropriate:   Tobacco  Allergies  Meds  Problems  Med Hx  Surg Hx  Fam Hx     Review of Systems:  No other skin or systemic complaints except as noted in HPI or Assessment and Plan.  Objective  Well appearing patient in no apparent distress; mood and affect are within normal limits.  A full examination was performed including scalp, head, eyes, ears, nose, lips, neck, chest, axillae, abdomen, back, buttocks, bilateral upper extremities, bilateral lower extremities, hands, feet, fingers, toes, fingernails, and toenails. All findings within normal limits unless otherwise noted below.  Right Lower Leg - Anterior Stuck on waxy paps with erythema   Assessment & Plan   Lentigines - Scattered tan macules - Due to sun exposure - Benign-appearing, observe - Recommend daily broad spectrum sunscreen SPF 30+ to sun-exposed areas, reapply every 2 hours as needed. - Call for any changes - back  Seborrheic Keratoses - Stuck-on, waxy, tan-brown papules and/or plaques  - Benign-appearing - Discussed benign etiology and prognosis. - Observe - Call for any changes - trunk, legs  Melanocytic Nevi - Tan-brown and/or pink-flesh-colored symmetric macules and papules - Benign appearing on exam today - Observation - Call clinic for new or changing moles - Recommend daily use of broad spectrum spf 30+ sunscreen to sun-exposed areas.  - trunk  Hemangiomas - Red papules -  Discussed benign nature - Observe - Call for any changes - trunk  Actinic Damage - Chronic condition, secondary to cumulative UV/sun exposure - diffuse scaly erythematous macules with underlying dyspigmentation - Recommend daily broad spectrum sunscreen SPF 30+ to sun-exposed areas, reapply every 2 hours as needed.  - Staying in the shade or wearing long sleeves, sun glasses (UVA+UVB protection) and wide brim hats (4-inch brim around the entire circumference of the hat) are also recommended for sun protection.  - Call for new or changing lesions.  Skin cancer screening performed today.   Inflamed seborrheic keratosis Right Lower Leg - Anterior Pt declined treatement  Return in about 1 year (around 11/21/2022).  I, Othelia Pulling, RMA, am acting as scribe for Sarina Ser, MD . Documentation: I have reviewed the above documentation for accuracy and completeness, and I agree with the above.  Sarina Ser, MD

## 2021-11-20 NOTE — Patient Instructions (Signed)
Due to recent changes in healthcare laws, you may see results of your pathology and/or laboratory studies on MyChart before the doctors have had a chance to review them. We understand that in some cases there may be results that are confusing or concerning to you. Please understand that not all results are received at the same time and often the doctors may need to interpret multiple results in order to provide you with the best plan of care or course of treatment. Therefore, we ask that you please give us 2 business days to thoroughly review all your results before contacting the office for clarification. Should we see a critical lab result, you will be contacted sooner.   If You Need Anything After Your Visit  If you have any questions or concerns for your doctor, please call our main line at [phone] and press option 4 to reach your doctor's medical assistant. If no one answers, please leave a voicemail as directed and we will return your call as soon as possible. Messages left after 4 pm will be answered the following business day.   You may also send us a message via MyChart. We typically respond to MyChart messages within 1-2 business days.  For prescription refills, please ask your pharmacy to contact our office. Our fax number is [phone].  If you have an urgent issue when the clinic is closed that cannot wait until the next business day, you can page your doctor at the number below.    Please note that while we do our best to be available for urgent issues outside of office hours, we are not available 24/7.   If you have an urgent issue and are unable to reach us, you may choose to seek medical care at your doctor's office, retail clinic, urgent care center, or emergency room.  If you have a medical emergency, please immediately call 911 or go to the emergency department.  Pager Numbers  - Dr. Kowalski: [phone]  - Dr. Moye: [phone]  - Dr. Stewart:  [phone]  In the event of inclement weather, please call our main line at [phone] for an update on the status of any delays or closures.  Dermatology Medication Tips: Please keep the boxes that topical medications come in in order to help keep track of the instructions about where and how to use these. Pharmacies typically print the medication instructions only on the boxes and not directly on the medication tubes.   If your medication is too expensive, please contact our office at [phone] option 4 or send us a message through MyChart.   We are unable to tell what your co-pay for medications will be in advance as this is different depending on your insurance coverage. However, we may be able to find a substitute medication at lower cost or fill out paperwork to get insurance to cover a needed medication.   If a prior authorization is required to get your medication covered by your insurance company, please allow us 1-2 business days to complete this process.  Drug prices often vary depending on where the prescription is filled and some pharmacies may offer cheaper prices.  The website www.goodrx.com contains coupons for medications through different pharmacies. The prices here do not account for what the cost may be with help from insurance (it may be cheaper with your insurance), but the website can give you the price if you did not use any insurance.  - You can print the associated coupon and take it with   your prescription to the pharmacy.  - You may also stop by our office during regular business hours and pick up a GoodRx coupon card.  - If you need your prescription sent electronically to a different pharmacy, notify our office through [facility] MyChart or by phone at [phone] option 4.     Si Usted Necesita Algo Despus de Su Visita  Tambin puede enviarnos un mensaje a travs de MyChart. Por lo general respondemos a los mensajes de MyChart en el transcurso de 1 a 2  das hbiles.  Para renovar recetas, por favor pida a su farmacia que se ponga en contacto con nuestra oficina. Nuestro nmero de fax es el [phone].  Si tiene un asunto urgente cuando la clnica est cerrada y que no puede esperar hasta el siguiente da hbil, puede llamar/localizar a su doctor(a) al nmero que aparece a continuacin.   Por favor, tenga en cuenta que aunque hacemos todo lo posible para estar disponibles para asuntos urgentes fuera del horario de oficina, no estamos disponibles las 24 horas del da, los 7 das de la semana.   Si tiene un problema urgente y no puede comunicarse con nosotros, puede optar por buscar atencin mdica  en el consultorio de su doctor(a), en una clnica privada, en un centro de atencin urgente o en una sala de emergencias.  Si tiene una emergencia mdica, por favor llame inmediatamente al 911 o vaya a la sala de emergencias.  Nmeros de bper  - Dr. Kowalski: [phone]  - Dra. Moye: [phone]  - Dra. Stewart: [phone]  En caso de inclemencias del tiempo, por favor llame a nuestra lnea principal al [phone] para una actualizacin sobre el estado de cualquier retraso o cierre.  Consejos para la medicacin en dermatologa: Por favor, guarde las cajas en las que vienen los medicamentos de uso tpico para ayudarle a seguir las instrucciones sobre dnde y cmo usarlos. Las farmacias generalmente imprimen las instrucciones del medicamento slo en las cajas y no directamente en los tubos del medicamento.   Si su medicamento es muy caro, por favor, pngase en contacto con nuestra oficina llamando al [phone] y presione la opcin 4 o envenos un mensaje a travs de MyChart.   No podemos decirle cul ser su copago por los medicamentos por adelantado ya que esto es diferente dependiendo de la cobertura de su seguro. Sin embargo, es posible que podamos encontrar un medicamento sustituto a menor costo o llenar un formulario para que el  seguro cubra el medicamento que se considera necesario.   Si se requiere una autorizacin previa para que su compaa de seguros cubra su medicamento, por favor permtanos de 1 a 2 das hbiles para completar este proceso.  Los precios de los medicamentos varan con frecuencia dependiendo del lugar de dnde se surte la receta y alguna farmacias pueden ofrecer precios ms baratos.  El sitio web www.goodrx.com tiene cupones para medicamentos de diferentes farmacias. Los precios aqu no tienen en cuenta lo que podra costar con la ayuda del seguro (puede ser ms barato con su seguro), pero el sitio web puede darle el precio si no utiliz ningn seguro.  - Puede imprimir el cupn correspondiente y llevarlo con su receta a la farmacia.  - Tambin puede pasar por nuestra oficina durante el horario de atencin regular y recoger una tarjeta de cupones de GoodRx.  - Si necesita que su receta se enve electrnicamente a una farmacia diferente, informe a nuestra oficina a travs de MyChart de [facility]   o por telfono llamando al [phone] y presione la opcin 4.  

## 2021-11-23 ENCOUNTER — Encounter: Payer: Self-pay | Admitting: Dermatology

## 2021-12-02 ENCOUNTER — Ambulatory Visit: Payer: BC Managed Care – PPO

## 2021-12-05 ENCOUNTER — Ambulatory Visit (INDEPENDENT_AMBULATORY_CARE_PROVIDER_SITE_OTHER): Payer: BC Managed Care – PPO | Admitting: *Deleted

## 2021-12-05 DIAGNOSIS — E538 Deficiency of other specified B group vitamins: Secondary | ICD-10-CM

## 2021-12-05 MED ORDER — CYANOCOBALAMIN 1000 MCG/ML IJ SOLN
1000.0000 ug | Freq: Once | INTRAMUSCULAR | Status: AC
  Administered 2021-12-05: 1000 ug via INTRAMUSCULAR

## 2021-12-05 NOTE — Progress Notes (Signed)
Pt received B12 injection in left deltoid & tolerated it well with no complaints.

## 2021-12-08 ENCOUNTER — Other Ambulatory Visit (HOSPITAL_COMMUNITY)
Admission: RE | Admit: 2021-12-08 | Discharge: 2021-12-08 | Disposition: A | Discharge status: Home / Self Care | Payer: BC Managed Care – PPO | Source: Ambulatory Visit | Attending: Internal Medicine | Admitting: Internal Medicine

## 2021-12-08 ENCOUNTER — Ambulatory Visit (INDEPENDENT_AMBULATORY_CARE_PROVIDER_SITE_OTHER): Payer: BC Managed Care – PPO | Admitting: Internal Medicine

## 2021-12-08 ENCOUNTER — Encounter: Payer: Self-pay | Admitting: Internal Medicine

## 2021-12-08 VITALS — BP 100/70 | HR 55 | Temp 97.6°F | Resp 14 | Ht 63.0 in | Wt 95.2 lb

## 2021-12-08 DIAGNOSIS — Z23 Encounter for immunization: Secondary | ICD-10-CM

## 2021-12-08 DIAGNOSIS — D696 Thrombocytopenia, unspecified: Secondary | ICD-10-CM

## 2021-12-08 DIAGNOSIS — E538 Deficiency of other specified B group vitamins: Secondary | ICD-10-CM | POA: Diagnosis not present

## 2021-12-08 DIAGNOSIS — Z124 Encounter for screening for malignant neoplasm of cervix: Secondary | ICD-10-CM | POA: Insufficient documentation

## 2021-12-08 DIAGNOSIS — R1013 Epigastric pain: Secondary | ICD-10-CM

## 2021-12-08 DIAGNOSIS — K219 Gastro-esophageal reflux disease without esophagitis: Secondary | ICD-10-CM

## 2021-12-08 DIAGNOSIS — M810 Age-related osteoporosis without current pathological fracture: Secondary | ICD-10-CM | POA: Diagnosis not present

## 2021-12-08 DIAGNOSIS — E785 Hyperlipidemia, unspecified: Secondary | ICD-10-CM | POA: Diagnosis not present

## 2021-12-08 DIAGNOSIS — R7989 Other specified abnormal findings of blood chemistry: Secondary | ICD-10-CM

## 2021-12-08 DIAGNOSIS — Z0000 Encounter for general adult medical examination without abnormal findings: Secondary | ICD-10-CM | POA: Diagnosis not present

## 2021-12-08 LAB — CBC WITH DIFFERENTIAL/PLATELET
Basophils Absolute: 0 10*3/uL (ref 0.0–0.1)
Basophils Relative: 0.9 % (ref 0.0–3.0)
Eosinophils Absolute: 0 10*3/uL (ref 0.0–0.7)
Eosinophils Relative: 0.8 % (ref 0.0–5.0)
HCT: 39.6 % (ref 36.0–46.0)
Hemoglobin: 13.5 g/dL (ref 12.0–15.0)
Lymphocytes Relative: 33.6 % (ref 12.0–46.0)
Lymphs Abs: 1.3 10*3/uL (ref 0.7–4.0)
MCHC: 34 g/dL (ref 30.0–36.0)
MCV: 101.9 fl — ABNORMAL HIGH (ref 78.0–100.0)
Monocytes Absolute: 0.3 10*3/uL (ref 0.1–1.0)
Monocytes Relative: 7.1 % (ref 3.0–12.0)
Neutro Abs: 2.3 10*3/uL (ref 1.4–7.7)
Neutrophils Relative %: 57.6 % (ref 43.0–77.0)
Platelets: 128 10*3/uL — ABNORMAL LOW (ref 150.0–400.0)
RBC: 3.89 Mil/uL (ref 3.87–5.11)
RDW: 12.6 % (ref 11.5–15.5)
WBC: 4 10*3/uL (ref 4.0–10.5)

## 2021-12-08 LAB — URINALYSIS, ROUTINE W REFLEX MICROSCOPIC
Bilirubin Urine: NEGATIVE
Hgb urine dipstick: NEGATIVE
Ketones, ur: NEGATIVE
Leukocytes,Ua: NEGATIVE
Nitrite: NEGATIVE
RBC / HPF: NONE SEEN (ref 0–?)
Specific Gravity, Urine: <=1.005 — AB (ref 1.000–1.030)
Total Protein, Urine: NEGATIVE
Urine Glucose: NEGATIVE
Urobilinogen, UA: 0.2 (ref 0.0–1.0)
WBC, UA: NONE SEEN (ref 0–?)
pH: 6 (ref 5.0–8.0)

## 2021-12-08 LAB — HEPATIC FUNCTION PANEL
ALT: 28 U/L (ref 0–35)
AST: 30 U/L (ref 0–37)
Albumin: 4.1 g/dL (ref 3.5–5.2)
Alkaline Phosphatase: 57 U/L (ref 39–117)
Bilirubin, Direct: 0.1 mg/dL (ref 0.0–0.3)
Total Bilirubin: 0.7 mg/dL (ref 0.2–1.2)
Total Protein: 6.3 g/dL (ref 6.0–8.3)

## 2021-12-08 LAB — BASIC METABOLIC PANEL
BUN: 14 mg/dL (ref 6–23)
CO2: 31 mEq/L (ref 19–32)
Calcium: 9.3 mg/dL (ref 8.4–10.5)
Chloride: 102 mEq/L (ref 96–112)
Creatinine, Ser: 0.87 mg/dL (ref 0.40–1.20)
GFR: 71.06 mL/min (ref >60.00)
Glucose, Bld: 86 mg/dL (ref 70–99)
Potassium: 3.8 mEq/L (ref 3.5–5.1)
Sodium: 138 mEq/L (ref 135–145)

## 2021-12-08 LAB — LIPID PANEL
Cholesterol: 224 mg/dL — ABNORMAL HIGH (ref 0–200)
HDL: 87.3 mg/dL (ref >39.00)
LDL Cholesterol: 127 mg/dL — ABNORMAL HIGH (ref 0–99)
NonHDL: 136.96
Total CHOL/HDL Ratio: 3
Triglycerides: 50 mg/dL (ref 0.0–149.0)
VLDL: 10 mg/dL (ref 0.0–40.0)

## 2021-12-08 LAB — VITAMIN B12: Vitamin B-12: 828 pg/mL (ref 211–911)

## 2021-12-08 LAB — VITAMIN D 25 HYDROXY (VIT D DEFICIENCY, FRACTURES): VITD: 79.08 ng/mL (ref 30.00–100.00)

## 2021-12-08 LAB — TSH: TSH: 1.08 u[IU]/mL (ref 0.35–5.50)

## 2021-12-08 NOTE — Progress Notes (Signed)
Patient ID: Leslie Davidson, female   DOB: 1960-06-29, 61 y.o.   MRN: [id number]   Subjective:    Patient ID: Leslie Davidson, female    DOB: 06/08/1960, 61 y.o.   MRN: [id number]   Patient here for  Chief Complaint  Patient presents with   Annual Exam    Fasting for labs.    Marland Kitchen   HPI Here for her physical exam.  S/p hip fracture - after fall 03/22/21.  Doing well.  Stays active. Saw hematology for evaluation of low platelets.  Presumed ITP.  No chest pain or sob reported.  No abdominal pain.  Acid reflux controlled.  No abdominal pain.  Bowels moving.  Discussed bone density and treatment options.     Past Medical History:  Diagnosis Date   Arthritis    GERD (gastroesophageal reflux disease)    Past Surgical History:  Procedure Laterality Date   AUGMENTATION MAMMAPLASTY Bilateral    2000   BREAST ENHANCEMENT SURGERY  2000   HIP PINNING,CANNULATED Left 03/22/2021   Procedure: CANNULATED HIP PINNING;  Surgeon: Corky Mull, MD;  Location: [facility] ORS;  Service: Orthopedics;  Laterality: Left;   Family History  Problem Relation Age of Onset   Diabetes Mother    Cancer Father        Lung cancer   Arthritis Maternal Grandmother    Breast cancer Neg Hx    Social History   Socioeconomic History   Marital status: Married    Spouse name: Not on file   Number of children: Not on file   Years of education: Not on file   Highest education level: Not on file  Occupational History   Not on file  Tobacco Use   Smoking status: Former    Types: Cigarettes    Quit date: 03/31/1983    Years since quitting: 38.7   Smokeless tobacco: Never  Substance and Sexual Activity   Alcohol use: Yes    Alcohol/week: 0.0 standard drinks of alcohol    Comment: Occasional    Drug use: No   Sexual activity: Yes    Partners: Male    Birth control/protection: Post-menopausal    Comment: Husband  Other Topics Concern   Not on file  Social History Narrative   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      Quit smoking in 1985. Rare alcohol.    Social Determinants of Health   Financial Resource Strain: Not on file  Food Insecurity: Not on file  Transportation Needs: Not on file  Physical Activity: Not on file  Stress: Not on file  Social Connections: Not on file     Review of Systems  Constitutional:  Negative for appetite change and unexpected weight change.  HENT:  Negative for congestion, sinus pressure and sore throat.   Eyes:  Negative for pain and visual disturbance.  Respiratory:  Negative for cough, chest tightness and shortness of breath.   Cardiovascular:  Negative for chest pain, palpitations and leg swelling.  Gastrointestinal:  Negative for abdominal pain, diarrhea, nausea and vomiting.  Genitourinary:  Negative for difficulty urinating and dysuria.  Musculoskeletal:  Negative for joint swelling and myalgias.  Skin:  Negative for color change and rash.  Neurological:  Negative for dizziness, light-headedness and headaches.  Hematological:  Negative for adenopathy. Does not bruise/bleed  easily.  Psychiatric/Behavioral:  Negative for agitation and dysphoric mood.        Objective:     BP 100/70 (BP Location: Left Arm, Patient Position: Sitting, Cuff Size: Normal)   Pulse (!) 55   Temp 97.6 F (36.4 C) (Oral)   Resp 14   Ht '5\' 3"'$  (1.6 m)   Wt 95 lb 3.2 oz (43.2 kg)   LMP 01/02/2013   SpO2 97%   BMI 16.86 kg/m  Wt Readings from Last 3 Encounters:  12/08/21 95 lb 3.2 oz (43.2 kg)  06/03/21 96 lb (43.5 kg)  03/29/21 100 lb (45.4 kg)    Physical Exam Vitals reviewed.  Constitutional:      General: She is not in acute distress.    Appearance: Normal appearance. She is well-developed.  HENT:     Head: Normocephalic and atraumatic.     Right Ear: External ear normal.     Left Ear: External ear normal.   Eyes:     General: No scleral icterus.       Right eye: No discharge.        Left eye: No discharge.     Conjunctiva/sclera: Conjunctivae normal.  Neck:     Thyroid: No thyromegaly.  Cardiovascular:     Rate and Rhythm: Normal rate and regular rhythm.  Pulmonary:     Effort: No tachypnea, accessory muscle usage or respiratory distress.     Breath sounds: Normal breath sounds. No decreased breath sounds or wheezing.  Chest:  Breasts:    Right: No inverted nipple, mass, nipple discharge or tenderness (no axillary adenopathy).     Left: No inverted nipple, mass, nipple discharge or tenderness (no axilarry adenopathy).  Abdominal:     General: Bowel sounds are normal.     Palpations: Abdomen is soft.     Tenderness: There is no abdominal tenderness.  Genitourinary:    Comments: Normal external genitalia.  Vaginal vault without lesions.  Cervix identified.  Pap smear performed.  Could not appreciate any adnexal masses or tenderness.   Musculoskeletal:        General: No swelling or tenderness.     Cervical back: Neck supple.  Lymphadenopathy:     Cervical: No cervical adenopathy.  Skin:    Findings: No erythema or rash.  Neurological:     Mental Status: She is alert and oriented to person, place, and time.  Psychiatric:        Mood and Affect: Mood normal.        Behavior: Behavior normal.      Outpatient Encounter Medications as of 12/08/2021  Medication Sig   COLLAGEN PO Take by mouth.   RABEprazole (ACIPHEX) 20 MG tablet Take 1 tablet (20 mg total) by mouth daily. (Patient taking differently: Take 20 mg by mouth daily as needed.)   No facility-administered encounter medications on file as of 12/08/2021.     Lab Results  Component Value Date   WBC 4.0 12/08/2021   HGB 13.5 12/08/2021   HCT 39.6 12/08/2021   PLT 128.0 (L) 12/08/2021   GLUCOSE 86 12/08/2021   CHOL 224 (H) 12/08/2021   TRIG 50.0 12/08/2021   HDL 87.30 12/08/2021   LDLCALC 127 (H) 12/08/2021   ALT 28  12/08/2021   AST 30 12/08/2021   NA 138 12/08/2021   K 3.8 12/08/2021   CL 102 12/08/2021   CREATININE 0.87 12/08/2021   BUN 14 12/08/2021   CO2 31 12/08/2021   TSH 1.08 12/08/2021  HGBA1C 5.9 09/06/2018    MM 3D SCREEN BREAST W/IMPLANT BILATERAL  Result Date: 07/22/2021 CLINICAL DATA:  Screening. EXAM: DIGITAL SCREENING BILATERAL MAMMOGRAM WITH IMPLANTS, CAD AND TOMOSYNTHESIS TECHNIQUE: Bilateral screening digital craniocaudal and mediolateral oblique mammograms were obtained. Bilateral screening digital breast tomosynthesis was performed. The images were evaluated with computer-aided detection. Standard and/or implant displaced views were performed. COMPARISON:  Previous exams. ACR Breast Density Category b: There are scattered areas of fibroglandular density. FINDINGS: The patient has bilateral subpectoral saline implants. There are no findings suspicious for malignancy. IMPRESSION: No mammographic evidence of malignancy. A result letter of this screening mammogram will be mailed directly to the patient. RECOMMENDATION: Screening mammogram in one year. (Code:SM-B-01Y) BI-RADS CATEGORY  1:  Negative. Electronically Signed   By: Everlean Alstrom M.D.   On: 07/22/2021 16:31   DG Bone Density  Result Date: 07/22/2021 EXAM: DUAL X-RAY ABSORPTIOMETRY (DXA) FOR BONE MINERAL DENSITY IMPRESSION: Your patient Leslie Davidson completed a BMD test on 07/22/2021 using the [employer] (software version: 14.10) manufactured by [employer]. The following summarizes the results of our evaluation. Technologist: JB PATIENT BIOGRAPHICAL: Name: Leslie Davidson, Leslie Davidson Patient ID: [id number] Birth Date: 1961/02/13 Height: 63.0 in. Gender: Female Exam Date: 07/22/2021 Weight: 97.8 lbs. Indications: Caucasian, History of Fracture (Adult), Left Hip surgery, Postmenopausal Fractures: Left hip Treatments: Vitamin D DENSITOMETRY RESULTS: Site         Region     Measured Date Measured Age WHO Classification Young  Adult T-score BMD         %Change vs. Previous Significant Change (*) AP Spine L1-L2 07/22/2019 62.7 Osteoporosis -2.9 0.821 g/cm2 Right Femur Total 07/22/2019 62.7 Osteoporosis -3.0 0.626 g/cm2 Left Forearm Radius 33% 07[age]22/2019 62.7 Osteoporosis -2.6 0.649 g/cm2 ASSESSMENT: The BMD measured at Femur Total is 0.626 g/cm2 with a T-score of -3.0. This patient is considered osteoporotic according to [facility]) criteria. The scan quality is good. L-3 & 4 was excluded due to degenerative changes. Left femur was excluded due to surgical hardware. World Pharmacologist [facility]) criteria for post-menopausal, Caucasian Women: Normal:                   T-score at or above -1 SD Osteopenia/low bone mass: T-score between -1 and -2.5 SD Osteoporosis:             T-score at or below -2.5 SD RECOMMENDATIONS: 1. All patients should optimize calcium and vitamin D intake. 2. Consider FDA-approved medical therapies in postmenopausal women and men aged 61 years and older, based on the following: a. A hip or vertebral(clinical or morphometric) fracture b. T-score < -2.5 at the femoral neck or spine after appropriate evaluation to exclude secondary causes c. Low bone mass (T-score between -1.0 and -2.5 at the femoral neck or spine) and a 10-year probability of a hip fracture > 3% or a 10-year probability of a major osteoporosis-related fracture > 20% based on the [place]-adapted WHO algorithm 3. Clinician judgment and/or patient preferences may indicate treatment for people with 10-year fracture probabilities above or below these levels FOLLOW-UP: People with diagnosed cases of osteoporosis or at high risk for fracture should have regular bone mineral density tests. For patients eligible for Medicare, routine testing is allowed once every 2 years. The testing frequency can be increased to one year for patients who have rapidly progressing disease, those who are receiving or discontinuing medical therapy to restore bone  mass, or have additional risk factors. I have reviewed  this report, and agree with the above findings. Mark A. Thornton Papas, M.D. [facility] Electronically Signed   By: Lavonia Dana M.D.   On: 07/22/2021 12:05       Assessment & Plan:   Problem List Items Addressed This Visit     Abdominal pain, epigastric    On aciphex.  Acid reflux controlled.       Relevant Orders   Basic metabolic panel (Completed)   B12 deficiency    Check B12 level.       Relevant Orders   Vitamin B12 (Completed)   GERD (gastroesophageal reflux disease)    Controlled on aciphex.       Healthcare maintenance    Physical today 12/08/21.  PAP 12/08/21.  Colonoscopy 05/2019.  Mammogram 07/22/21 - Birads I.       Hyperlipidemia    Last lipid panel - wnl.  Follow lipid panel.       Relevant Orders   Lipid panel (Completed)   Osteoporosis    Discussed treatment options.  Discussed calcium, vitamin d and weight bearing exercise.  Discussed oral bisphosphonates.  Hold given her GI issues.  Discussed reclast and prolia.  Interested in [place].  Check labs, including urine and vitamin D.        Relevant Orders   VITAMIN D 25 Hydroxy (Vit-D Deficiency, Fractures) (Completed)   Urinalysis, Routine w reflex microscopic (Completed)   Thrombocytopenia (HCC)    Chronic - mild.  Ultrasound - negative for liver disease.  Hepatitis, HIV, peripheral smear - normal.  Saw hematology.  Suspected ITP.  Recommended to continue to follow.  Treat if pts platelets are less than 50 or starts bleeding.       Relevant Orders   TSH (Completed)   CBC with Differential/Platelet (Completed)   Other Visit Diagnoses     Routine general medical examination at a health care facility    -  Primary   Abnormal liver function test       Relevant Orders   Hepatic function panel (Completed)   Cervical cancer screening       Relevant Orders   Cytology - PAP( [place]) (Completed)   Need for immunization against influenza        Relevant Orders   Flu Vaccine QUAD 40moIM (Fluarix, Fluzone & Alfiuria Quad PF) (Completed)   [place]       Relevant Orders   CBC with Differential/Platelet        CEinar Pheasant MD

## 2021-12-08 NOTE — Assessment & Plan Note (Signed)
Physical today 12/08/21.  PAP 12/08/21.  Colonoscopy 05/2019.  Mammogram 07/22/21 - Birads I.

## 2021-12-11 LAB — CYTOLOGY - PAP
Comment: NEGATIVE
Diagnosis: NEGATIVE
High risk HPV: NEGATIVE

## 2021-12-14 ENCOUNTER — Encounter: Payer: Self-pay | Admitting: Internal Medicine

## 2021-12-14 NOTE — Assessment & Plan Note (Signed)
Chronic - mild.  Ultrasound - negative for liver disease.  Hepatitis, HIV, peripheral smear - normal.  Saw hematology.  Suspected ITP.  Recommended to continue to follow.  Treat if pts platelets are less than 50 or starts bleeding.

## 2021-12-14 NOTE — Assessment & Plan Note (Signed)
Discussed treatment options.  Discussed calcium, vitamin d and weight bearing exercise.  Discussed oral bisphosphonates.  Hold given her GI issues.  Discussed reclast and prolia.  Interested in [place].  Check labs, including urine and vitamin D.

## 2021-12-14 NOTE — Assessment & Plan Note (Signed)
Last lipid panel - wnl.  Follow lipid panel.

## 2021-12-14 NOTE — Assessment & Plan Note (Signed)
Check B12 level. 

## 2021-12-14 NOTE — Assessment & Plan Note (Signed)
On aciphex.  Acid reflux controlled.

## 2021-12-14 NOTE — Assessment & Plan Note (Signed)
Controlled on aciphex.   

## 2021-12-17 ENCOUNTER — Telehealth: Payer: Self-pay | Admitting: Internal Medicine

## 2021-12-17 DIAGNOSIS — M810 Age-related osteoporosis without current pathological fracture: Secondary | ICD-10-CM

## 2021-12-17 NOTE — Telephone Encounter (Signed)
I had talked to [facility] about treatment for osteoporosis.  I had told her that I would see if could get reclast in [place].  Please let her know that she will need to go to [facility] and I can get her set up with endocrinology there.  Let me know if desires referral.

## 2021-12-17 NOTE — Telephone Encounter (Signed)
Pt was okay with endocrinology referral for reclast infusion.

## 2021-12-24 ENCOUNTER — Other Ambulatory Visit (INDEPENDENT_AMBULATORY_CARE_PROVIDER_SITE_OTHER): Payer: BC Managed Care – PPO

## 2021-12-24 DIAGNOSIS — D696 Thrombocytopenia, unspecified: Secondary | ICD-10-CM

## 2021-12-24 LAB — CBC WITH DIFFERENTIAL/PLATELET
Basophils Absolute: 0 10*3/uL (ref 0.0–0.1)
Basophils Relative: 1 % (ref 0.0–3.0)
Eosinophils Absolute: 0 10*3/uL (ref 0.0–0.7)
Eosinophils Relative: 1.3 % (ref 0.0–5.0)
HCT: 40.6 % (ref 36.0–46.0)
Hemoglobin: 13.6 g/dL (ref 12.0–15.0)
Lymphocytes Relative: 31 % (ref 12.0–46.0)
Lymphs Abs: 1.1 10*3/uL (ref 0.7–4.0)
MCHC: 33.6 g/dL (ref 30.0–36.0)
MCV: 102.6 fl — ABNORMAL HIGH (ref 78.0–100.0)
Monocytes Absolute: 0.3 10*3/uL (ref 0.1–1.0)
Monocytes Relative: 8.9 % (ref 3.0–12.0)
Neutro Abs: 2.1 10*3/uL (ref 1.4–7.7)
Neutrophils Relative %: 57.8 % (ref 43.0–77.0)
Platelets: 135 10*3/uL — ABNORMAL LOW (ref 150.0–400.0)
RBC: 3.96 Mil/uL (ref 3.87–5.11)
RDW: 12.8 % (ref 11.5–15.5)
WBC: 3.6 10*3/uL — ABNORMAL LOW (ref 4.0–10.5)

## 2021-12-25 ENCOUNTER — Other Ambulatory Visit: Payer: Self-pay

## 2021-12-25 ENCOUNTER — Telehealth: Payer: Self-pay

## 2021-12-25 DIAGNOSIS — D696 Thrombocytopenia, unspecified: Secondary | ICD-10-CM

## 2021-12-25 NOTE — Telephone Encounter (Signed)
LMTCB regarding lab results.  

## 2021-12-25 NOTE — Telephone Encounter (Signed)
-----   Message from Einar Pheasant, MD sent at 12/25/2021  4:06 AM EDT ----- Notify - platelet count is stable.  White blood cell count slightly decreased.  We will follow.  Recheck cbc in 6-8 weeks.  (Dx thrombocytopenia)

## 2022-01-19 NOTE — Telephone Encounter (Signed)
Pt called stating no one has reached out to her about making an appointment for her referral

## 2022-01-20 ENCOUNTER — Ambulatory Visit (INDEPENDENT_AMBULATORY_CARE_PROVIDER_SITE_OTHER): Payer: BC Managed Care – PPO

## 2022-01-20 DIAGNOSIS — E538 Deficiency of other specified B group vitamins: Secondary | ICD-10-CM | POA: Diagnosis not present

## 2022-01-20 MED ORDER — CYANOCOBALAMIN 1000 MCG/ML IJ SOLN
1000.0000 ug | Freq: Once | INTRAMUSCULAR | Status: AC
  Administered 2022-01-20: 1000 ug via INTRAMUSCULAR

## 2022-01-20 NOTE — Progress Notes (Signed)
Pt arrived for B12 injection, given in L deltoid. Pt tolerated injection well, showed no signs of distress nor voiced any concerns.  ?

## 2022-01-20 NOTE — Telephone Encounter (Signed)
Please let her know that order has been placed for referral. Someone should be contacting her with an appt date and time.  Thanks

## 2022-01-20 NOTE — Telephone Encounter (Signed)
Pt has been informed.

## 2022-01-20 NOTE — Addendum Note (Signed)
Addended by: Alisa Graff on: 01/20/2022 04:14 AM   Modules accepted: Orders

## 2022-02-05 ENCOUNTER — Telehealth: Payer: Self-pay | Admitting: Internal Medicine

## 2022-02-05 ENCOUNTER — Other Ambulatory Visit: Payer: Self-pay

## 2022-02-05 DIAGNOSIS — D696 Thrombocytopenia, unspecified: Secondary | ICD-10-CM

## 2022-02-05 NOTE — Telephone Encounter (Signed)
Orders placed.

## 2022-02-05 NOTE — Telephone Encounter (Signed)
Patient  has a lab appt 02/12/2022, there are no orders in.

## 2022-02-12 ENCOUNTER — Other Ambulatory Visit (INDEPENDENT_AMBULATORY_CARE_PROVIDER_SITE_OTHER): Payer: BC Managed Care – PPO

## 2022-02-12 DIAGNOSIS — D696 Thrombocytopenia, unspecified: Secondary | ICD-10-CM

## 2022-02-12 LAB — CBC WITH DIFFERENTIAL/PLATELET
Basophils Absolute: 0 10*3/uL (ref 0.0–0.1)
Basophils Relative: 1 % (ref 0.0–3.0)
Eosinophils Absolute: 0 10*3/uL (ref 0.0–0.7)
Eosinophils Relative: 1 % (ref 0.0–5.0)
HCT: 42 % (ref 36.0–46.0)
Hemoglobin: 14.1 g/dL (ref 12.0–15.0)
Lymphocytes Relative: 34.5 % (ref 12.0–46.0)
Lymphs Abs: 1.5 10*3/uL (ref 0.7–4.0)
MCHC: 33.6 g/dL (ref 30.0–36.0)
MCV: 101.2 fl — ABNORMAL HIGH (ref 78.0–100.0)
Monocytes Absolute: 0.4 10*3/uL (ref 0.1–1.0)
Monocytes Relative: 8.7 % (ref 3.0–12.0)
Neutro Abs: 2.4 10*3/uL (ref 1.4–7.7)
Neutrophils Relative %: 54.8 % (ref 43.0–77.0)
Platelets: 164 10*3/uL (ref 150.0–400.0)
RBC: 4.15 Mil/uL (ref 3.87–5.11)
RDW: 12.7 % (ref 11.5–15.5)
WBC: 4.3 10*3/uL (ref 4.0–10.5)

## 2022-02-13 ENCOUNTER — Telehealth: Payer: Self-pay

## 2022-02-13 NOTE — Telephone Encounter (Signed)
LM FOR PT TO CB  Please notify Leslie Davidson that her white blood cell count and platelet count are wnl.

## 2022-02-13 NOTE — Telephone Encounter (Addendum)
Pt called back, I read the message to her and she verbalized understanding

## 2022-02-24 ENCOUNTER — Ambulatory Visit (INDEPENDENT_AMBULATORY_CARE_PROVIDER_SITE_OTHER): Payer: BC Managed Care – PPO

## 2022-02-24 DIAGNOSIS — E538 Deficiency of other specified B group vitamins: Secondary | ICD-10-CM | POA: Diagnosis not present

## 2022-02-24 MED ORDER — CYANOCOBALAMIN 1000 MCG/ML IJ SOLN
1000.0000 ug | Freq: Once | INTRAMUSCULAR | Status: AC
  Administered 2022-02-24: 1000 ug via INTRAMUSCULAR

## 2022-02-24 NOTE — Progress Notes (Signed)
Pt presented for their vitamin B12 injection. Pt was identified through two identifiers. Pt tolerated shot well in their left  deltoid.  

## 2022-03-31 ENCOUNTER — Ambulatory Visit: Payer: BC Managed Care – PPO

## 2022-04-02 ENCOUNTER — Ambulatory Visit (INDEPENDENT_AMBULATORY_CARE_PROVIDER_SITE_OTHER): Payer: BC Managed Care – PPO

## 2022-04-02 DIAGNOSIS — E538 Deficiency of other specified B group vitamins: Secondary | ICD-10-CM

## 2022-04-02 MED ORDER — CYANOCOBALAMIN 1000 MCG/ML IJ SOLN
1000.0000 ug | Freq: Once | INTRAMUSCULAR | Status: AC
  Administered 2022-04-02: 1000 ug via INTRAMUSCULAR

## 2022-04-02 NOTE — Progress Notes (Signed)
Patient was administered a B12 injection in her Left Arm. Patient tolerated injection well. Patient did not show  signs of distress or voice any concerns.

## 2022-05-04 ENCOUNTER — Ambulatory Visit (INDEPENDENT_AMBULATORY_CARE_PROVIDER_SITE_OTHER): Payer: BC Managed Care – PPO

## 2022-05-04 DIAGNOSIS — E538 Deficiency of other specified B group vitamins: Secondary | ICD-10-CM | POA: Diagnosis not present

## 2022-05-04 MED ORDER — CYANOCOBALAMIN 1000 MCG/ML IJ SOLN
1000.0000 ug | Freq: Once | INTRAMUSCULAR | Status: AC
  Administered 2022-05-04: 1000 ug via INTRAMUSCULAR

## 2022-05-04 NOTE — Progress Notes (Signed)
Pt presented for their vitamin B12 injection. Pt was identified through two identifiers. Pt tolerated shot well in their right deltoid.  

## 2022-06-08 ENCOUNTER — Ambulatory Visit: Payer: BC Managed Care – PPO | Admitting: Internal Medicine

## 2022-06-08 ENCOUNTER — Encounter: Payer: Self-pay | Admitting: Internal Medicine

## 2022-06-08 ENCOUNTER — Telehealth: Payer: Self-pay

## 2022-06-08 VITALS — BP 104/68 | HR 60 | Temp 98.0°F | Resp 16 | Ht 63.0 in | Wt 99.0 lb

## 2022-06-08 DIAGNOSIS — D72819 Decreased white blood cell count, unspecified: Secondary | ICD-10-CM

## 2022-06-08 DIAGNOSIS — R87619 Unspecified abnormal cytological findings in specimens from cervix uteri: Secondary | ICD-10-CM

## 2022-06-08 DIAGNOSIS — D696 Thrombocytopenia, unspecified: Secondary | ICD-10-CM

## 2022-06-08 DIAGNOSIS — R0789 Other chest pain: Secondary | ICD-10-CM | POA: Diagnosis not present

## 2022-06-08 DIAGNOSIS — E538 Deficiency of other specified B group vitamins: Secondary | ICD-10-CM

## 2022-06-08 DIAGNOSIS — K219 Gastro-esophageal reflux disease without esophagitis: Secondary | ICD-10-CM

## 2022-06-08 DIAGNOSIS — E785 Hyperlipidemia, unspecified: Secondary | ICD-10-CM

## 2022-06-08 DIAGNOSIS — M810 Age-related osteoporosis without current pathological fracture: Secondary | ICD-10-CM | POA: Diagnosis not present

## 2022-06-08 DIAGNOSIS — R1013 Epigastric pain: Secondary | ICD-10-CM | POA: Diagnosis not present

## 2022-06-08 DIAGNOSIS — Z8781 Personal history of (healed) traumatic fracture: Secondary | ICD-10-CM

## 2022-06-08 DIAGNOSIS — E871 Hypo-osmolality and hyponatremia: Secondary | ICD-10-CM

## 2022-06-08 LAB — CBC WITH DIFFERENTIAL/PLATELET
Basophils Absolute: 0 10*3/uL (ref 0.0–0.1)
Basophils Relative: 1.4 % (ref 0.0–3.0)
Eosinophils Absolute: 0 10*3/uL (ref 0.0–0.7)
Eosinophils Relative: 1.2 % (ref 0.0–5.0)
HCT: 41.1 % (ref 36.0–46.0)
Hemoglobin: 14 g/dL (ref 12.0–15.0)
Lymphocytes Relative: 34.9 % (ref 12.0–46.0)
Lymphs Abs: 1.2 10*3/uL (ref 0.7–4.0)
MCHC: 34.1 g/dL (ref 30.0–36.0)
MCV: 101.4 fl — ABNORMAL HIGH (ref 78.0–100.0)
Monocytes Absolute: 0.3 10*3/uL (ref 0.1–1.0)
Monocytes Relative: 8.1 % (ref 3.0–12.0)
Neutro Abs: 1.9 10*3/uL (ref 1.4–7.7)
Neutrophils Relative %: 54.4 % (ref 43.0–77.0)
Platelets: 161 10*3/uL (ref 150.0–400.0)
RBC: 4.06 Mil/uL (ref 3.87–5.11)
RDW: 12.8 % (ref 11.5–15.5)
WBC: 3.5 10*3/uL — ABNORMAL LOW (ref 4.0–10.5)

## 2022-06-08 LAB — BASIC METABOLIC PANEL
BUN: 13 mg/dL (ref 6–23)
CO2: 29 mEq/L (ref 19–32)
Calcium: 9.5 mg/dL (ref 8.4–10.5)
Chloride: 98 mEq/L (ref 96–112)
Creatinine, Ser: 0.77 mg/dL (ref 0.40–1.20)
GFR: 81.99 mL/min (ref >60.00)
Glucose, Bld: 92 mg/dL (ref 70–99)
Potassium: 3.9 mEq/L (ref 3.5–5.1)
Sodium: 134 mEq/L — ABNORMAL LOW (ref 135–145)

## 2022-06-08 LAB — URINALYSIS, ROUTINE W REFLEX MICROSCOPIC
Bilirubin Urine: NEGATIVE
Hgb urine dipstick: NEGATIVE
Ketones, ur: NEGATIVE
Leukocytes,Ua: NEGATIVE
Nitrite: NEGATIVE
RBC / HPF: NONE SEEN (ref 0–?)
Specific Gravity, Urine: <=1.005 — AB (ref 1.000–1.030)
Total Protein, Urine: NEGATIVE
Urine Glucose: NEGATIVE
Urobilinogen, UA: 0.2 (ref 0.0–1.0)
pH: 6 (ref 5.0–8.0)

## 2022-06-08 LAB — LIPID PANEL
Cholesterol: 223 mg/dL — ABNORMAL HIGH (ref 0–200)
HDL: 93.2 mg/dL (ref >39.00)
LDL Cholesterol: 118 mg/dL — ABNORMAL HIGH (ref 0–99)
NonHDL: 129.74
Total CHOL/HDL Ratio: 2
Triglycerides: 58 mg/dL (ref 0.0–149.0)
VLDL: 11.6 mg/dL (ref 0.0–40.0)

## 2022-06-08 LAB — HEPATIC FUNCTION PANEL
ALT: 26 U/L (ref 0–35)
AST: 29 U/L (ref 0–37)
Albumin: 4.1 g/dL (ref 3.5–5.2)
Alkaline Phosphatase: 65 U/L (ref 39–117)
Bilirubin, Direct: 0.1 mg/dL (ref 0.0–0.3)
Total Bilirubin: 0.7 mg/dL (ref 0.2–1.2)
Total Protein: 6.6 g/dL (ref 6.0–8.3)

## 2022-06-08 LAB — VITAMIN D 25 HYDROXY (VIT D DEFICIENCY, FRACTURES): VITD: 106.08 ng/mL (ref 30.00–100.00)

## 2022-06-08 LAB — LIPASE: Lipase: 25 U/L (ref 11.0–59.0)

## 2022-06-08 MED ORDER — RABEPRAZOLE SODIUM 20 MG PO TBEC: 20.0000 mg | DELAYED_RELEASE_TABLET | Freq: Every day | ORAL | 1 refills | Status: DC

## 2022-06-08 NOTE — Telephone Encounter (Signed)
Noted. Critical on Dr Lars Mage desk.

## 2022-06-08 NOTE — Progress Notes (Signed)
Subjective:    Patient ID: Leslie Davidson, female    DOB: May 18, 1960, 62 y.o.   MRN: [id number]  Patient here for  Chief Complaint  Patient presents with   Medical Management of Chronic Issues    HPI Here to follow up regarding acid reflux.  Previous hip fracture 02/2021.  Stable.  Is back exercising.  Doing pilates.  Has been walking.  She has noticed some problems with "acid".  She feels has worsened since startig pilates.  She reports epigastric discomfort (intermittent) and increased burping.  Worsened recently.  Not on aciphex.  Also reports noticing left side chest pressure - notices when exercises.  Reports this is different from her "acid reflux symptoms".  Last episode - approximately 10 days ago.  No vomiting.  No diarrhea.  Also reports headache. Not a severe headache.  Was questioning if could be related to her neck - from lifting weights, etc. Discussed getting eyes checked. Stopped lifting last week.  No increased cough.  Some nasal dripping.  Saw hematology - presumed ITP. Referral previously placed for reclast.     Past Medical History:  Diagnosis Date   Arthritis    GERD (gastroesophageal reflux disease)    Past Surgical History:  Procedure Laterality Date   AUGMENTATION MAMMAPLASTY Bilateral    2000   BREAST ENHANCEMENT SURGERY  2000   HIP PINNING,CANNULATED Left 03/22/2021   Procedure: CANNULATED HIP PINNING;  Surgeon: Corky Mull, MD;  Location: [facility];  Service: Orthopedics;  Laterality: Left;   Family History  Problem Relation Age of Onset   Diabetes Mother    Cancer Father        Lung cancer   Arthritis Maternal Grandmother    Breast cancer Neg Hx    Social History   Socioeconomic History   Marital status: Married    Spouse name: Not on file   Number of children: Not on file   Years of education: Not on file   Highest education level: Not on file  Occupational History   Not on file  Tobacco Use   Smoking status: Former    Types: Cigarettes     Quit date: 03/31/1983    Years since quitting: 39.2   Smokeless tobacco: Never  Substance and Sexual Activity   Alcohol use: Yes    Alcohol/week: 0.0 standard drinks of alcohol    Comment: Occasional    Drug use: No   Sexual activity: Yes    Partners: Male    Birth control/protection: Post-menopausal    Comment: Husband  Other Topics Concern   Not on file  Social History Narrative   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      Quit smoking in 1985. Rare alcohol.    Social Determinants of Health   Financial Resource Strain: Not on file  Food Insecurity: Not on file  Transportation Needs: Not on file  Physical Activity: Not on file  Stress: Not on file  Social Connections: Not on file     Review of Systems  Constitutional:  Negative for appetite change and unexpected weight change.  HENT:  Negative for sinus pressure.        Nasal dripping.   Respiratory:  Negative for cough, chest tightness and shortness of breath.   Cardiovascular:  Negative for chest pain and palpitations.  Gastrointestinal:  Negative for nausea and vomiting.       Increased acid reflux.  Burning.  Pain - epigastric area.   Genitourinary:  Negative for difficulty urinating and dysuria.       Has noticed odor - urine.   Musculoskeletal:  Negative for joint swelling and myalgias.  Skin:  Negative for color change and rash.  Neurological:  Negative for dizziness.       Some headache as outlined.  Was questioning if related to her neck.   Psychiatric/Behavioral:  Negative for agitation and dysphoric mood.        Objective:     BP 104/68   Pulse 60   Temp 98 F (36.7 C)   Resp 16   Ht 5\' 3"  (1.6 m)   Wt 99 lb (44.9 kg)   LMP 01/02/2013   SpO2 98%   BMI 17.54 kg/m  Wt Readings from Last 3 Encounters:  06/08/22 99 lb (44.9 kg)  12/08/21 95 lb 3.2  oz (43.2 kg)  06/03/21 96 lb (43.5 kg)    Physical Exam Vitals reviewed.  Constitutional:      General: She is not in acute distress.    Appearance: Normal appearance.  HENT:     Head: Normocephalic and atraumatic.     Right Ear: External ear normal.     Left Ear: External ear normal.  Eyes:     General: No scleral icterus.       Right eye: No discharge.        Left eye: No discharge.     Conjunctiva/sclera: Conjunctivae normal.  Neck:     Thyroid: No thyromegaly.  Cardiovascular:     Rate and Rhythm: Normal rate and regular rhythm.  Pulmonary:     Effort: No respiratory distress.     Breath sounds: Normal breath sounds. No wheezing.  Abdominal:     General: Bowel sounds are normal.     Palpations: Abdomen is soft.     Tenderness: There is no abdominal tenderness.  Musculoskeletal:        General: No swelling or tenderness.     Cervical back: Neck supple. No tenderness.  Lymphadenopathy:     Cervical: No cervical adenopathy.  Skin:    Findings: No erythema or rash.  Neurological:     Mental Status: She is alert.  Psychiatric:        Mood and Affect: Mood normal.        Behavior: Behavior normal.      Outpatient Encounter Medications as of 06/08/2022  Medication Sig   B Complex Vitamins (B COMPLEX PO) Take by mouth daily.   BIOTIN PO Take 1 tablet by mouth daily.   Cholecalciferol (VITAMIN D3 PO) Take by mouth daily.   COLLAGEN PO Take by mouth.   RABEprazole (ACIPHEX) 20 MG tablet Take 1 tablet (20 mg total) by mouth daily.   [DISCONTINUED] RABEprazole (ACIPHEX) 20 MG tablet Take 1 tablet (20 mg total) by mouth daily. (Patient taking differently: Take 20 mg by mouth daily as needed.)   No facility-administered encounter medications on file as of 06/08/2022.     Lab Results  Component Value Date   WBC 3.5 (L) 06/08/2022   HGB 14.0 06/08/2022   HCT 41.1 06/08/2022   PLT 161.0 06/08/2022   GLUCOSE 92 06/08/2022   CHOL 223 (H) 06/08/2022   TRIG 58.0  06/08/2022   HDL 93.20 06/08/2022   LDLCALC 118 (H) 06/08/2022   ALT 26 06/08/2022  AST 29 06/08/2022   NA 134 (L) 06/08/2022   K 3.9 06/08/2022   CL 98 06/08/2022   CREATININE 0.77 06/08/2022   BUN 13 06/08/2022   CO2 29 06/08/2022   TSH 1.08 12/08/2021   HGBA1C 5.9 09/06/2018    No results found.     Assessment & Plan:  Hyperlipidemia, unspecified hyperlipidemia type Assessment & Plan: Follow lipid panel.   Orders: -     Hepatic function panel -     Basic metabolic panel -     Lipid panel  B12 deficiency Assessment & Plan: Continue B12 supplementation.   Orders: -     Urinalysis, Routine w reflex microscopic  Osteoporosis without current pathological fracture, unspecified osteoporosis type Assessment & Plan: Have discussed treatment options.  Discussed calcium, vitamin d and weight bearing exercise.  Discussed oral bisphosphonates.  Hold given her GI issues.  Discussed reclast and prolia.  Interested in [place]. Had placed referral.  Will see if can arrange for infusion here in town.   Orders: -     VITAMIN D 25 Hydroxy (Vit-D Deficiency, Fractures)  Thrombocytopenia (HCC) Assessment & Plan: Chronic - mild.  Ultrasound - negative for liver disease.  Hepatitis, HIV, peripheral smear - normal.  Saw hematology.  Suspected ITP.  Recommended to continue to follow.  Treat if pts platelets are less than 50 or starts bleeding.   Orders: -     CBC with Differential/Platelet -     Hepatic function panel  Chest pressure Assessment & Plan: Reports noticing left side chest pressure - notices when exercises.  Reports this is different from her "acid reflux symptoms".  Last episode - approximately 10 days ago.  EKG - SR with no acute ischemic changes.  Will treat GI symptoms as outlined.  Restart aciphex.  Plan further w/up and f/u with GI.  Given symptoms and described as a different feeling from her typical acid reflux, will have cardiology evaluate with question of need for  further testing.    Orders: -     EKG 12-Lead  Abdominal pain, epigastric Assessment & Plan: Increased acid reflux and symptoms as outlined.  Restart aciphex.  Discussed f/u with GI.  Discussed EGD.  Also discussed CT abd/pelvis. Elected to hold on scanning.  Plan GI f/u as outlined.  Will have GI reevaluate given significant return of symptoms off PPI.    Orders: -     Lipase  Abnormal cervical Papanicolaou smear, unspecified abnormal pap finding Assessment & Plan: PAP 11/2021 - negative with negative HPV.  Recommend f/u in one year.    Gastroesophageal reflux disease without esophagitis Assessment & Plan: Increased acid reflux with epigastric pain.  Restart aciphex.  F/u with GI for further evaluation as outlined, given significant return of symptoms when off PPI.    History of femur fracture Assessment & Plan: Bone density 07/22/21- osteoporosis.  Had referred to start reclast.  See if can arrange here in town.    Other orders -     RABEprazole Sodium; Take 1 tablet (20 mg total) by mouth daily.  Dispense: 90 tablet; Refill: 1     Einar Pheasant, MD

## 2022-06-08 NOTE — Telephone Encounter (Signed)
Margarita Grizzle called from [facility] with critical Vitamin D results for patient.  Roetta Sessions, CMA, had just pulled a patient back, so I transferred call to Denita Lung, [place].

## 2022-06-09 NOTE — Telephone Encounter (Signed)
Patient taking vit d3 5000 units per day, will stop. Future labs ordered for non fasting lab in 2 weeks,

## 2022-06-09 NOTE — Telephone Encounter (Signed)
See result note as well.

## 2022-06-09 NOTE — Addendum Note (Signed)
Addended by: Roetta Sessions D on: 06/09/2022 12:24 PM   Modules accepted: Orders

## 2022-06-09 NOTE — Telephone Encounter (Signed)
See lab result note regarding vitamin D.  Document dose currently taking and have her hold.  We will follow.

## 2022-06-14 ENCOUNTER — Encounter: Payer: Self-pay | Admitting: Internal Medicine

## 2022-06-14 ENCOUNTER — Telehealth: Payer: Self-pay | Admitting: Internal Medicine

## 2022-06-14 DIAGNOSIS — R0789 Other chest pain: Secondary | ICD-10-CM

## 2022-06-14 DIAGNOSIS — K219 Gastro-esophageal reflux disease without esophagitis: Secondary | ICD-10-CM

## 2022-06-14 NOTE — Assessment & Plan Note (Signed)
PAP 11/2021 - negative with negative HPV.  Recommend f/u in one year.

## 2022-06-14 NOTE — Assessment & Plan Note (Addendum)
Increased acid reflux with epigastric pain.  Restart aciphex.  F/u with GI for further evaluation as outlined, given significant return of symptoms when off PPI.

## 2022-06-14 NOTE — Assessment & Plan Note (Signed)
-  Continue B12 supplementation °

## 2022-06-14 NOTE — Telephone Encounter (Signed)
See me before calling.  She has seen [facility] previously.  Recently with increased acid reflux and epigastric burning and burping off aciphex.  I have restarted aciphex. Needs a f/u with GI [facility]) for question of need for further w/up (for example EGD) given significant return of symptoms off PPI.  Also, confirm with pt that given her left side chest pressure she experienced, she is ok with me schedule appt with cardiology.  (Does she have a preference of which cardiologist she sees).

## 2022-06-14 NOTE — Assessment & Plan Note (Addendum)
Increased acid reflux and symptoms as outlined.  Restart aciphex.  Discussed f/u with GI.  Discussed EGD.  Also discussed CT abd/pelvis. Elected to hold on scanning.  Plan GI f/u as outlined.  Will have GI reevaluate given significant return of symptoms off PPI.

## 2022-06-14 NOTE — Assessment & Plan Note (Signed)
Follow lipid panel.   

## 2022-06-14 NOTE — Assessment & Plan Note (Signed)
Have discussed treatment options.  Discussed calcium, vitamin d and weight bearing exercise.  Discussed oral bisphosphonates.  Hold given her GI issues.  Discussed reclast and prolia.  Interested in [facility]. Had placed referral.  Will see if can arrange for infusion here in town.

## 2022-06-14 NOTE — Assessment & Plan Note (Signed)
Bone density 07/22/21- osteoporosis.  Had referred to start reclast.  See if can arrange here in town.

## 2022-06-14 NOTE — Assessment & Plan Note (Signed)
Reports noticing left side chest pressure - notices when exercises.  Reports this is different from her "acid reflux symptoms".  Last episode - approximately 10 days ago.  EKG - SR with no acute ischemic changes.  Will treat GI symptoms as outlined.  Restart aciphex.  Plan further w/up and f/u with GI.  Given symptoms and described as a different feeling from her typical acid reflux, will have cardiology evaluate with question of need for further testing.

## 2022-06-14 NOTE — Assessment & Plan Note (Signed)
Chronic - mild.  Ultrasound - negative for liver disease.  Hepatitis, HIV, peripheral smear - normal.  Saw hematology.  Suspected ITP.  Recommended to continue to follow.  Treat if pts platelets are less than 50 or starts bleeding.  ?

## 2022-06-14 NOTE — Telephone Encounter (Signed)
This pt also needs reclast.  I had placed referral, but pt states was going to be July or so before being seen.  Just let me know what I need to do to order here.  Thanks.

## 2022-06-15 NOTE — Telephone Encounter (Signed)
Thank you :)

## 2022-06-15 NOTE — Telephone Encounter (Addendum)
Leslie Davidson (Key: [id number]) PA Case ID #: [id number] Prior authorization completed  for Reclast. Awaiting insurance determination.

## 2022-06-16 NOTE — Telephone Encounter (Signed)
Patient is ok to see GI and cardiology. She wants to see Dr Rockey Situ because she has seen him before. Do you want me to place referral to him?

## 2022-06-16 NOTE — Telephone Encounter (Signed)
   Susie Cassette KeyHoy Morn - PA Case ID: [id number] Need help? Call us at [phone] Outcome Deniedon March 18 Canceled. Please note this request does not require prior authorization review. Thank you.

## 2022-06-17 NOTE — Telephone Encounter (Signed)
Order placed for cardiology referral.  Needs a f/u appt with [facility] - persistent acid relfux and increased symptoms when off PPI.  Thanks

## 2022-06-17 NOTE — Addendum Note (Signed)
Addended by: Alisa Graff on: 06/17/2022 04:36 AM   Modules accepted: Orders

## 2022-06-18 NOTE — Telephone Encounter (Signed)
Ok.  Sounds good, so that I will know how to do the orders.

## 2022-06-18 NOTE — Telephone Encounter (Signed)
Per patient insurance no authorization is needed see PA case ID [id number] returned canceled due to know authorization needed.

## 2022-06-23 ENCOUNTER — Other Ambulatory Visit (INDEPENDENT_AMBULATORY_CARE_PROVIDER_SITE_OTHER): Payer: BC Managed Care – PPO

## 2022-06-23 DIAGNOSIS — E871 Hypo-osmolality and hyponatremia: Secondary | ICD-10-CM

## 2022-06-23 DIAGNOSIS — D72819 Decreased white blood cell count, unspecified: Secondary | ICD-10-CM | POA: Diagnosis not present

## 2022-06-23 LAB — CBC WITH DIFFERENTIAL/PLATELET
Basophils Absolute: 0 10*3/uL (ref 0.0–0.1)
Basophils Relative: 1 % (ref 0.0–3.0)
Eosinophils Absolute: 0.1 10*3/uL (ref 0.0–0.7)
Eosinophils Relative: 1.4 % (ref 0.0–5.0)
HCT: 41.2 % (ref 36.0–46.0)
Hemoglobin: 14.1 g/dL (ref 12.0–15.0)
Lymphocytes Relative: 36.1 % (ref 12.0–46.0)
Lymphs Abs: 1.4 10*3/uL (ref 0.7–4.0)
MCHC: 34.1 g/dL (ref 30.0–36.0)
MCV: 102.2 fl — ABNORMAL HIGH (ref 78.0–100.0)
Monocytes Absolute: 0.3 10*3/uL (ref 0.1–1.0)
Monocytes Relative: 7.4 % (ref 3.0–12.0)
Neutro Abs: 2 10*3/uL (ref 1.4–7.7)
Neutrophils Relative %: 54.1 % (ref 43.0–77.0)
Platelets: 171 10*3/uL (ref 150.0–400.0)
RBC: 4.04 Mil/uL (ref 3.87–5.11)
RDW: 12.8 % (ref 11.5–15.5)
WBC: 3.7 10*3/uL — ABNORMAL LOW (ref 4.0–10.5)

## 2022-06-23 LAB — SODIUM: Sodium: 140 mEq/L (ref 135–145)

## 2022-06-23 NOTE — Telephone Encounter (Signed)
Mychart sent to patient.

## 2022-06-23 NOTE — Telephone Encounter (Signed)
GI appt scheduled.

## 2022-07-08 ENCOUNTER — Encounter: Payer: Self-pay | Admitting: Internal Medicine

## 2022-07-08 NOTE — Telephone Encounter (Signed)
Order placed for reclast infusion.  Please notify pt - someone should be contacting with appt

## 2022-07-08 NOTE — Addendum Note (Signed)
Addended by: Charm Barges on: 07/08/2022 02:49 PM   Modules accepted: Orders

## 2022-07-09 NOTE — Telephone Encounter (Signed)
Patient aware.

## 2022-07-10 NOTE — Telephone Encounter (Signed)
Same day surgery has been  notified. They will notify patient.

## 2022-07-13 ENCOUNTER — Ambulatory Visit (INDEPENDENT_AMBULATORY_CARE_PROVIDER_SITE_OTHER): Payer: BC Managed Care – PPO

## 2022-07-13 DIAGNOSIS — E538 Deficiency of other specified B group vitamins: Secondary | ICD-10-CM | POA: Diagnosis not present

## 2022-07-13 MED ORDER — CYANOCOBALAMIN 1000 MCG/ML IJ SOLN
1000.0000 ug | Freq: Once | INTRAMUSCULAR | Status: AC
Start: 2022-07-13 — End: 2022-07-13
  Administered 2022-07-13: 1000 ug via INTRAMUSCULAR

## 2022-07-13 NOTE — Progress Notes (Signed)
Pt presented for their vitamin B12 injection. Pt was identified through two identifiers. Pt tolerated shot well in their left  deltoid.  

## 2022-07-20 ENCOUNTER — Other Ambulatory Visit: Payer: Self-pay | Admitting: Surgery

## 2022-07-20 DIAGNOSIS — M84361A Stress fracture, right tibia, initial encounter for fracture: Secondary | ICD-10-CM

## 2022-07-20 DIAGNOSIS — M25561 Pain in right knee: Secondary | ICD-10-CM

## 2022-07-20 DIAGNOSIS — S72012D Unspecified intracapsular fracture of left femur, subsequent encounter for closed fracture with routine healing: Secondary | ICD-10-CM | POA: Diagnosis not present

## 2022-07-22 ENCOUNTER — Ambulatory Visit
Admission: RE | Admit: 2022-07-22 | Discharge: 2022-07-22 | Disposition: A | Discharge status: Home / Self Care | Payer: BC Managed Care – PPO | Source: Ambulatory Visit | Attending: Surgery | Admitting: Surgery

## 2022-07-22 DIAGNOSIS — M84361A Stress fracture, right tibia, initial encounter for fracture: Secondary | ICD-10-CM | POA: Insufficient documentation

## 2022-07-22 DIAGNOSIS — M25561 Pain in right knee: Secondary | ICD-10-CM | POA: Insufficient documentation

## 2022-07-23 ENCOUNTER — Ambulatory Visit: Admission: RE | Admit: 2022-07-23 | Payer: BC Managed Care – PPO | Source: Ambulatory Visit

## 2022-07-30 ENCOUNTER — Encounter: Payer: Self-pay | Admitting: Internal Medicine

## 2022-08-03 DIAGNOSIS — M84361D Stress fracture, right tibia, subsequent encounter for fracture with routine healing: Secondary | ICD-10-CM | POA: Diagnosis not present

## 2022-08-04 ENCOUNTER — Encounter: Payer: Self-pay | Admitting: Internal Medicine

## 2022-08-04 ENCOUNTER — Ambulatory Visit: Payer: BC Managed Care – PPO | Admitting: Internal Medicine

## 2022-08-04 VITALS — BP 112/70 | HR 65 | Temp 98.1°F | Resp 16 | Ht 63.0 in | Wt 97.8 lb

## 2022-08-04 DIAGNOSIS — E785 Hyperlipidemia, unspecified: Secondary | ICD-10-CM | POA: Diagnosis not present

## 2022-08-04 DIAGNOSIS — E538 Deficiency of other specified B group vitamins: Secondary | ICD-10-CM

## 2022-08-04 DIAGNOSIS — Z1231 Encounter for screening mammogram for malignant neoplasm of breast: Secondary | ICD-10-CM

## 2022-08-04 DIAGNOSIS — R1013 Epigastric pain: Secondary | ICD-10-CM | POA: Diagnosis not present

## 2022-08-04 DIAGNOSIS — M810 Age-related osteoporosis without current pathological fracture: Secondary | ICD-10-CM

## 2022-08-04 DIAGNOSIS — D72819 Decreased white blood cell count, unspecified: Secondary | ICD-10-CM

## 2022-08-04 DIAGNOSIS — Z8781 Personal history of (healed) traumatic fracture: Secondary | ICD-10-CM

## 2022-08-04 DIAGNOSIS — R87619 Unspecified abnormal cytological findings in specimens from cervix uteri: Secondary | ICD-10-CM

## 2022-08-04 DIAGNOSIS — K219 Gastro-esophageal reflux disease without esophagitis: Secondary | ICD-10-CM

## 2022-08-04 DIAGNOSIS — D696 Thrombocytopenia, unspecified: Secondary | ICD-10-CM

## 2022-08-04 NOTE — Progress Notes (Signed)
Subjective:    Patient ID: Leslie Davidson, female    DOB: [birth year], 62 y.o.   MRN: [id number]  Patient here for  Chief Complaint  Patient presents with   Medical Management of Chronic Issues    HPI Here to follow up regarding osteoporosis, GERD and hypercholesterolemia.  Increased acid reflux last visit.  Back on aciphex.  Referred to GI. Appt scheduled in June.  She is having increased acid reflux now despite taking aciphex.  Watches what she eats.  Notices acid reflux and throat irritation when occurs.  Some left upper abdominal discomfort when occurs.  No vomiting.  No chest pain.  No cough or congestion.  No abdominal pain currently.  Seeing ortho for right knee pain - MRI - No meniscal or ligamentous injury of the right knee. Marrow edema in the proximal medial tibial metaphysis and to lesser extent medial epiphysis most concerning for stress reaction without a fracture. Overdue mammogram.     Past Medical History:  Diagnosis Date   Arthritis    GERD (gastroesophageal reflux disease)    Past Surgical History:  Procedure Laterality Date   AUGMENTATION MAMMAPLASTY Bilateral    2000   BREAST ENHANCEMENT SURGERY  2000   HIP PINNING,CANNULATED Left 03/22/2021   Procedure: CANNULATED HIP PINNING;  Surgeon: Christena Flake, MD;  Location: [facility] ORS;  Service: Orthopedics;  Laterality: Left;   Family History  Problem Relation Age of Onset   Diabetes Mother    Cancer Father        Lung cancer   Arthritis Maternal Grandmother    Breast cancer Neg Hx    Social History   Socioeconomic History   Marital status: Married    Spouse name: Not on file   Number of children: Not on file   Years of education: Not on file   Highest education level: 12th grade  Occupational History   Not on file  Tobacco Use   Smoking status: Former    Types: Cigarettes    Quit date: 03/31/1983    Years since quitting: 39.4   Smokeless tobacco: Never  Substance and Sexual Activity   Alcohol use: Yes     Alcohol/week: 0.0 standard drinks of alcohol    Comment: Occasional    Drug use: No   Sexual activity: Yes    Partners: Male    Birth control/protection: Post-menopausal    Comment: Husband  Other Topics Concern   Not on file  Social History Narrative   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      Quit smoking in 1985. Rare alcohol.    Social Determinants of Health   Financial Resource Strain: Low Risk  (08/04/2022)   Overall Financial Resource Strain (CARDIA)    Difficulty of Paying Living Expenses: Not hard at all  Food Insecurity: No Food Insecurity (08/04/2022)   Hunger Vital Sign    Worried About Running Out of Food in the Last Year: Never true    Ran Out of Food in the Last Year: Never true  Transportation Needs: No Transportation Needs (08/04/2022)   PRAPARE - Administrator, Civil Service (Medical): No    Lack of Transportation (Non-Medical): No  Physical Activity: Unknown (08/04/2022)   Exercise Vital Sign    Days of Exercise per Week: 0 days  Minutes of Exercise per Session: Not on file  Stress: No Stress Concern Present (08/04/2022)   [employer] of Occupational Health - Occupational Stress Questionnaire    Feeling of Stress : Only a little  Social Connections: Moderately Integrated (08/04/2022)   Social Connection and Isolation Panel [NHANES]    Frequency of Communication with Friends and Family: Once a week    Frequency of Social Gatherings with Friends and Family: Once a week    Attends Religious Services: More than 4 times per year    Active Member of [employer] or Organizations: Yes    Attends Engineer, structural: More than 4 times per year    Marital Status: Married     Review of Systems  Constitutional:  Negative for appetite change and unexpected weight change.  HENT:  Negative for  congestion and sinus pressure.   Respiratory:  Negative for cough, chest tightness and shortness of breath.   Cardiovascular:  Negative for chest pain and palpitations.  Gastrointestinal:  Negative for nausea and vomiting.       Increased acid reflux and left upper abdominal discomfort as outlined.    Genitourinary:  Negative for difficulty urinating and dysuria.  Musculoskeletal:  Negative for joint swelling and myalgias.  Skin:  Negative for color change and rash.  Neurological:  Negative for dizziness and headaches.  Psychiatric/Behavioral:  Negative for agitation and dysphoric mood.        Objective:     BP 112/70   Pulse 65   Temp 98.1 F (36.7 C)   Resp 16   Ht 5\' 3"  (1.6 m)   Wt 97 lb 12.8 oz (44.4 kg)   LMP 01/02/2013   SpO2 99%   BMI 17.32 kg/m  Wt Readings from Last 3 Encounters:  08/04/22 97 lb 12.8 oz (44.4 kg)  06/08/22 99 lb (44.9 kg)  12/08/21 95 lb 3.2 oz (43.2 kg)    Physical Exam Vitals reviewed.  Constitutional:      General: She is not in acute distress.    Appearance: Normal appearance.  HENT:     Head: Normocephalic and atraumatic.     Right Ear: External ear normal.     Left Ear: External ear normal.  Eyes:     General: No scleral icterus.       Right eye: No discharge.        Left eye: No discharge.     Conjunctiva/sclera: Conjunctivae normal.  Neck:     Thyroid: No thyromegaly.  Cardiovascular:     Rate and Rhythm: Normal rate and regular rhythm.  Pulmonary:     Effort: No respiratory distress.     Breath sounds: Normal breath sounds. No wheezing.  Abdominal:     General: Bowel sounds are normal.     Palpations: Abdomen is soft.     Tenderness: There is no abdominal tenderness.  Musculoskeletal:        General: No swelling or tenderness.     Cervical back: Neck supple. No tenderness.  Lymphadenopathy:     Cervical: No cervical adenopathy.  Skin:    Findings: No erythema or rash.  Neurological:     Mental Status: She is alert.   Psychiatric:        Mood and Affect: Mood normal.        Behavior: Behavior normal.      Outpatient Encounter Medications as of 08/04/2022  Medication Sig   B Complex Vitamins (B COMPLEX PO) Take by mouth daily.  BIOTIN PO Take 1 tablet by mouth daily.   COLLAGEN PO Take by mouth.   [DISCONTINUED] Cholecalciferol (VITAMIN D3 PO) Take by mouth daily.   [DISCONTINUED] RABEprazole (ACIPHEX) 20 MG tablet Take 1 tablet (20 mg total) by mouth daily.   No facility-administered encounter medications on file as of 08/04/2022.     Lab Results  Component Value Date   WBC 3.7 (L) 06/23/2022   HGB 14.1 06/23/2022   HCT 41.2 06/23/2022   PLT 171.0 06/23/2022   GLUCOSE 92 06/08/2022   CHOL 223 (H) 06/08/2022   TRIG 58.0 06/08/2022   HDL 93.20 06/08/2022   LDLCALC 118 (H) 06/08/2022   ALT 26 06/08/2022   AST 29 06/08/2022   NA 140 06/23/2022   K 3.9 06/08/2022   CL 98 06/08/2022   CREATININE 0.77 06/08/2022   BUN 13 06/08/2022   CO2 29 06/08/2022   TSH 1.08 12/08/2021   HGBA1C 5.9 09/06/2018    MR KNEE RIGHT WO CONTRAST  Result Date: 07/26/2022 CLINICAL DATA:  Right knee pain. EXAM: MRI OF THE RIGHT KNEE WITHOUT CONTRAST TECHNIQUE: Multiplanar, multisequence MR imaging of the knee was performed. No intravenous contrast was administered. COMPARISON:  None Available. FINDINGS: MENISCI Medial: Intact. Lateral: Intact. LIGAMENTS Cruciates: ACL and PCL are intact. Collaterals: Medial collateral ligament is intact. Lateral collateral ligament complex is intact. CARTILAGE Patellofemoral:  No chondral defect. Medial:  No chondral defect. Lateral:  No chondral defect. JOINT: No joint effusion. Normal Hoffa's fat-pad. No plical thickening. POPLITEAL FOSSA: Popliteus tendon is intact. Tiny Baker's cyst. EXTENSOR MECHANISM: Intact quadriceps tendon. Intact patellar tendon. Intact lateral patellar retinaculum. Intact medial patellar retinaculum. Intact MPFL. BONES: No aggressive osseous lesion. No  fracture or dislocation. Marrow edema in the proximal medial tibial metaphysis and to lesser extent medial epiphysis most concerning for stress reaction without a fracture. Other: No fluid collection or hematoma. Muscles are normal. IMPRESSION: 1. No meniscal or ligamentous injury of the right knee. 2. Marrow edema in the proximal medial tibial metaphysis and to lesser extent medial epiphysis most concerning for stress reaction without a fracture. Electronically Signed   By: Elige Ko M.D.   On: 07/26/2022 07:29       Assessment & Plan:  Hyperlipidemia, unspecified hyperlipidemia type Assessment & Plan: Follow lipid panel.   Orders: -     Lipid panel; Future -     Hepatic function panel; Future -     Basic metabolic panel; Future  Visit for screening mammogram -     3D Screening Mammogram w/Implants, Left and Right; Future  Leukopenia, unspecified type -     CBC with Differential/Platelet; Future  Abdominal pain, epigastric Assessment & Plan: Increased acid reflux and symptoms as outlined. Increase acid reflux to bid. Previous elected to hold on scanning.  Plan GI f/u as outlined.  GI reevaluate given significant return of symptoms off PPI.     Abnormal cervical Papanicolaou smear, unspecified abnormal pap finding Assessment & Plan: PAP 11/2021 - negative with negative HPV.  Recommend f/u in one year.    B12 deficiency Assessment & Plan: Continue B12 supplementation.    Gastroesophageal reflux disease without esophagitis Assessment & Plan: Persistent acid reflux despite taking aciphex daily.  Increase aciphex to bid.  Keep GI appt.  Follow.     History of femur fracture Assessment & Plan: Bone density 07/22/21- osteoporosis.  Plan reclast.    Osteoporosis without current pathological fracture, unspecified osteoporosis type Assessment & Plan: Have discussed treatment options.  Discussed calcium, vitamin d and weight bearing exercise.  Discussed oral bisphosphonates.   Hold given her GI issues.  Discussed reclast and prolia.  Interested in Pensions consultant. Planning for reclast infusion.    Thrombocytopenia (HCC) Assessment & Plan: Chronic - mild.  Ultrasound - negative for liver disease.  Hepatitis, HIV, peripheral smear - normal.  Saw hematology.  Suspected ITP.  Recommended to continue to follow.  Treat if pts platelets are less than 50 or starts bleeding. Follow cbc.       Dale [place], MD

## 2022-08-05 ENCOUNTER — Ambulatory Visit
Admission: RE | Admit: 2022-08-05 | Discharge: 2022-08-05 | Disposition: A | Discharge status: Home / Self Care | Payer: BC Managed Care – PPO | Source: Ambulatory Visit | Attending: Internal Medicine | Admitting: Internal Medicine

## 2022-08-05 DIAGNOSIS — M25561 Pain in right knee: Secondary | ICD-10-CM | POA: Diagnosis not present

## 2022-08-05 MED ORDER — DIPHENHYDRAMINE HCL 25 MG PO CAPS
ORAL_CAPSULE | ORAL | Status: AC
  Filled 2022-08-05: qty 1

## 2022-08-05 MED ORDER — ACETAMINOPHEN 325 MG PO TABS
ORAL_TABLET | ORAL | Status: AC
  Filled 2022-08-05: qty 2

## 2022-08-05 MED ORDER — ACETAMINOPHEN 325 MG PO TABS
650.0000 mg | ORAL_TABLET | Freq: Once | ORAL | Status: AC
  Administered 2022-08-05: 650 mg via ORAL

## 2022-08-05 MED ORDER — ZOLEDRONIC ACID 5 MG/100ML IV SOLN
5.0000 mg | Freq: Once | INTRAVENOUS | Status: AC
  Administered 2022-08-05: 5 mg via INTRAVENOUS
  Filled 2022-08-05: qty 100

## 2022-08-05 MED ORDER — DIPHENHYDRAMINE HCL 25 MG PO CAPS
25.0000 mg | ORAL_CAPSULE | Freq: Once | ORAL | Status: AC
  Administered 2022-08-05: 25 mg via ORAL

## 2022-08-11 ENCOUNTER — Encounter: Payer: Self-pay | Admitting: Internal Medicine

## 2022-08-11 NOTE — Telephone Encounter (Signed)
Ok to establish care with her daughter.  Regarding reclast, let me know if she feels needs to  be seen, etc.  Glad acid reflux is better.

## 2022-08-13 ENCOUNTER — Ambulatory Visit (INDEPENDENT_AMBULATORY_CARE_PROVIDER_SITE_OTHER): Payer: BC Managed Care – PPO

## 2022-08-13 DIAGNOSIS — E538 Deficiency of other specified B group vitamins: Secondary | ICD-10-CM

## 2022-08-13 MED ORDER — CYANOCOBALAMIN 1000 MCG/ML IJ SOLN
1000.0000 ug | Freq: Once | INTRAMUSCULAR | Status: AC
Start: 2022-08-13 — End: 2022-08-13
  Administered 2022-08-13: 1000 ug via INTRAMUSCULAR

## 2022-08-13 NOTE — Progress Notes (Signed)
Patient arrived for a B12 injection and it was administered into her Right deltoid. Patient tolerated the injection well and did not show any signs of distress or voice any concerns. 

## 2022-08-17 ENCOUNTER — Telehealth: Payer: Self-pay | Admitting: Internal Medicine

## 2022-08-17 NOTE — Telephone Encounter (Signed)
Ok to send in rx for aciphex bid?   Also patient says since reclast infusion she cannot sleep. She is wondering if you recommend anything OTC that she can try. She has tried melatonin gummies for 2 nights but could not even tell she took them. She is aware that you are out of the office so not expecting a call back today.

## 2022-08-17 NOTE — Telephone Encounter (Signed)
See phone note

## 2022-08-17 NOTE — Telephone Encounter (Signed)
Patient called and said that  Dr Lorin Picket forgot to send in her new prescription to take 2 pills a day, RABEprazole (ACIPHEX) 20 MG tablet.  Also, patient is having a hard time sleeping since her infusion, 2 weeks ago. Could Dr Lorin Picket call in something for her?

## 2022-08-18 ENCOUNTER — Other Ambulatory Visit: Payer: Self-pay

## 2022-08-18 MED ORDER — RABEPRAZOLE SODIUM 20 MG PO TBEC: 20.0000 mg | DELAYED_RELEASE_TABLET | Freq: Two times a day (BID) | ORAL | 1 refills | Status: DC

## 2022-08-18 NOTE — Telephone Encounter (Signed)
Ok to send in aciphex bid.  Regarding sleep and persistent issues after reclast, can schedule an appt - if feels needs to be evaluated.

## 2022-08-18 NOTE — Telephone Encounter (Signed)
Aciphex sent in for BID. Pt would like to hold off on appt for sleep right now. Says she slept better last night.Will let me know if appt is needed.

## 2022-08-19 DIAGNOSIS — K219 Gastro-esophageal reflux disease without esophagitis: Secondary | ICD-10-CM | POA: Diagnosis not present

## 2022-08-19 DIAGNOSIS — R1013 Epigastric pain: Secondary | ICD-10-CM | POA: Diagnosis not present

## 2022-08-20 ENCOUNTER — Ambulatory Visit: Payer: BC Managed Care – PPO

## 2022-08-21 ENCOUNTER — Ambulatory Visit
Admission: RE | Admit: 2022-08-21 | Discharge: 2022-08-21 | Disposition: A | Discharge status: Home / Self Care | Payer: BC Managed Care – PPO | Source: Ambulatory Visit | Attending: Internal Medicine | Admitting: Internal Medicine

## 2022-08-21 DIAGNOSIS — Z1231 Encounter for screening mammogram for malignant neoplasm of breast: Secondary | ICD-10-CM | POA: Insufficient documentation

## 2022-08-23 ENCOUNTER — Encounter: Payer: Self-pay | Admitting: Internal Medicine

## 2022-08-23 NOTE — Assessment & Plan Note (Signed)
Follow lipid panel.   

## 2022-08-23 NOTE — Assessment & Plan Note (Signed)
-  Continue B12 supplementation °

## 2022-08-23 NOTE — Assessment & Plan Note (Signed)
Persistent acid reflux despite taking aciphex daily.  Increase aciphex to bid.  Keep GI appt.  Follow.

## 2022-08-23 NOTE — Assessment & Plan Note (Signed)
Have discussed treatment options.  Discussed calcium, vitamin d and weight bearing exercise.  Discussed oral bisphosphonates.  Hold given her GI issues.  Discussed reclast and prolia.  Interested in Pensions consultant. Planning for reclast infusion.

## 2022-08-23 NOTE — Assessment & Plan Note (Signed)
Increased acid reflux and symptoms as outlined. Increase acid reflux to bid. Previous elected to hold on scanning.  Plan GI f/u as outlined.  GI reevaluate given significant return of symptoms off PPI.

## 2022-08-23 NOTE — Assessment & Plan Note (Signed)
PAP 11/2021 - negative with negative HPV.  Recommend f/u in one year.  

## 2022-08-23 NOTE — Assessment & Plan Note (Signed)
Bone density 07/22/21- osteoporosis.  Plan reclast.

## 2022-08-23 NOTE — Assessment & Plan Note (Signed)
Chronic - mild.  Ultrasound - negative for liver disease.  Hepatitis, HIV, peripheral smear - normal.  Saw hematology.  Suspected ITP.  Recommended to continue to follow.  Treat if pts platelets are less than 50 or starts bleeding. Follow cbc.

## 2022-08-27 ENCOUNTER — Ambulatory Visit: Admission: RE | Admit: 2022-08-27 | Payer: BC Managed Care – PPO | Source: Ambulatory Visit

## 2022-09-01 ENCOUNTER — Ambulatory Visit: Payer: BC Managed Care – PPO | Admitting: Cardiovascular Disease

## 2022-09-14 ENCOUNTER — Ambulatory Visit (INDEPENDENT_AMBULATORY_CARE_PROVIDER_SITE_OTHER): Payer: BC Managed Care – PPO

## 2022-09-14 DIAGNOSIS — E538 Deficiency of other specified B group vitamins: Secondary | ICD-10-CM | POA: Diagnosis not present

## 2022-09-14 MED ORDER — CYANOCOBALAMIN 1000 MCG/ML IJ SOLN
1000.0000 ug | Freq: Once | INTRAMUSCULAR | Status: AC
Start: 2022-09-14 — End: 2022-09-14
  Administered 2022-09-14: 1000 ug via INTRAMUSCULAR

## 2022-09-14 NOTE — Progress Notes (Signed)
Pt presented for their vitamin B12 injection. Pt was identified through two identifiers. Pt tolerated shot well in their left  deltoid.  

## 2022-10-06 ENCOUNTER — Ambulatory Visit: Payer: BC Managed Care – PPO | Admitting: Internal Medicine

## 2022-10-06 ENCOUNTER — Ambulatory Visit
Admission: EM | Admit: 2022-10-06 | Discharge: 2022-10-06 | Disposition: A | Discharge status: Home / Self Care | Payer: BC Managed Care – PPO | Attending: Emergency Medicine | Admitting: Emergency Medicine

## 2022-10-06 DIAGNOSIS — R300 Dysuria: Secondary | ICD-10-CM | POA: Diagnosis not present

## 2022-10-06 LAB — POCT URINALYSIS DIP (MANUAL ENTRY)
Bilirubin, UA: NEGATIVE
Blood, UA: NEGATIVE
Glucose, UA: NEGATIVE mg/dL
Ketones, POC UA: NEGATIVE mg/dL
Nitrite, UA: NEGATIVE
Protein Ur, POC: NEGATIVE mg/dL
Spec Grav, UA: 1.01 (ref 1.010–1.025)
Urobilinogen, UA: 0.2 E.U./dL
pH, UA: 6.5 (ref 5.0–8.0)

## 2022-10-06 MED ORDER — CEPHALEXIN 500 MG PO CAPS: 500.0000 mg | ORAL_CAPSULE | Freq: Two times a day (BID) | ORAL | 0 refills | Status: AC

## 2022-10-06 NOTE — ED Triage Notes (Signed)
Patient to Urgent Care with complaints of dysuria and suprapubic pain. Denies any vaginal discharge. Denies any fevers.  Symptoms started one week ago.

## 2022-10-06 NOTE — Discharge Instructions (Addendum)
Take the antibiotic as directed.  The urine culture is pending.  We will call you if it shows the need to change or discontinue your antibiotic.    Follow up with your primary care provider if your symptoms are not improving.    

## 2022-10-06 NOTE — ED Provider Notes (Signed)
Renaldo Fiddler    CSN: [id number] Arrival date & time: 10/06/22  [id number]      History   Chief Complaint Chief Complaint  Patient presents with   Urinary Frequency    HPI Leslie Davidson is a 62 y.o. female.  Patient presents with 1 week history of dysuria and bladder pressure.  She denies rash, fever, chills, abdominal pain, vaginal discharge, pelvic pain, flank pain, or other symptoms.  No treatments attempted at home.  The history is provided by the patient and medical records.    Past Medical History:  Diagnosis Date   Arthritis    GERD (gastroesophageal reflux disease)     Patient Active Problem List   Diagnosis Date Noted   Chest pressure 06/14/2022   Osteoporosis 12/08/2021   History of femur fracture 06/14/2021   Closed displaced fracture of left femoral neck (HCC) 03/22/2021   Preoperative clearance 03/22/2021   Accidental fall 03/22/2021   Closed left hip fracture (HCC) 03/22/2021   Abnormal Pap smear of cervix 01/15/2021   Depressive disorder, not elsewhere classified 01/15/2021   Incontinence 12/08/2020   Thrombocytopenia (HCC) 06/03/2020   B12 deficiency 09/22/2018   Healthcare maintenance 08/22/2016   Abdominal pain, epigastric 11/14/2015   Abnormal mammogram 05/27/2015   Hyperlipidemia 04/22/2015   GERD (gastroesophageal reflux disease) 11/09/2014   Arthritis 11/09/2014   Edema 11/26/2011   Macrocytosis 07/16/2011    Past Surgical History:  Procedure Laterality Date   AUGMENTATION MAMMAPLASTY Bilateral    2000   BREAST ENHANCEMENT SURGERY  2000   HIP PINNING,CANNULATED Left 03/22/2021   Procedure: CANNULATED HIP PINNING;  Surgeon: Christena Flake, MD;  Location: [facility] ORS;  Service: Orthopedics;  Laterality: Left;    OB History   No obstetric history on file.      Home Medications    Prior to Admission medications   Medication Sig Start Date End Date Taking? Authorizing Provider  cephALEXin (KEFLEX) 500 MG capsule Take 1 capsule (500  mg total) by mouth 2 (two) times daily for 5 days. 10/06/22 10/11/22 Yes Mickie Bail, NP  B Complex Vitamins (B COMPLEX PO) Take by mouth daily.    [provider]  BIOTIN PO Take 1 tablet by mouth daily.    [provider]  COLLAGEN PO Take by mouth.    [provider]  RABEprazole (ACIPHEX) 20 MG tablet Take 1 tablet (20 mg total) by mouth 2 (two) times daily. 08/18/22   Dale [place], MD    Family History Family History  Problem Relation Age of Onset   Diabetes Mother    Cancer Father        Lung cancer   Arthritis Maternal Grandmother    Breast cancer Neg Hx     Social History Social History   Tobacco Use   Smoking status: Former    Types: Cigarettes    Quit date: 03/31/1983    Years since quitting: 39.5   Smokeless tobacco: Never  Substance Use Topics   Alcohol use: Yes    Alcohol/week: 0.0 standard drinks of alcohol    Comment: Occasional    Drug use: No     Allergies   Patient has no known allergies.   Review of Systems Review of Systems  Constitutional:  Negative for chills and fever.  Gastrointestinal:  Negative for abdominal pain and vomiting.  Genitourinary:  Positive for dysuria. Negative for flank pain, hematuria, pelvic pain and vaginal discharge.  Skin:  Negative for color change and  rash.  All other systems reviewed and are negative.    Physical Exam Triage Vital Signs ED Triage Vitals  Enc Vitals Group     BP      Pulse      Resp      Temp      Temp src      SpO2      Weight      Height      Head Circumference      Peak Flow      Pain Score      Pain Loc      Pain Edu?      Excl. in GC?    No data found.  Updated Vital Signs BP 121/78   Pulse 74   Temp 98 F (36.7 C)   Resp 18   LMP 01/02/2013   SpO2 98%   Visual Acuity Right Eye Distance:   Left Eye Distance:   Bilateral Distance:    Right Eye Near:   Left Eye Near:    Bilateral Near:     Physical Exam Vitals and nursing note reviewed.   Constitutional:      General: She is not in acute distress.    Appearance: She is well-developed.  HENT:     Mouth/Throat:     Mouth: Mucous membranes are moist.  Cardiovascular:     Rate and Rhythm: Normal rate and regular rhythm.     Heart sounds: Normal heart sounds.  Pulmonary:     Effort: Pulmonary effort is normal. No respiratory distress.     Breath sounds: Normal breath sounds.  Abdominal:     General: Bowel sounds are normal.     Palpations: Abdomen is soft.     Tenderness: There is no abdominal tenderness. There is no right CVA tenderness, left CVA tenderness, guarding or rebound.  Musculoskeletal:     Cervical back: Neck supple.  Skin:    General: Skin is warm and dry.  Neurological:     Mental Status: She is alert.  Psychiatric:        Mood and Affect: Mood normal.        Behavior: Behavior normal.      UC Treatments / Results  Labs (all labs ordered are listed, but only abnormal results are displayed) Labs Reviewed  POCT URINALYSIS DIP (MANUAL ENTRY) - Abnormal; Notable for the following components:      Result Value   Leukocytes, UA Small (1+) (*)    All other components within normal limits  URINE CULTURE    EKG   Radiology No results found.  Procedures Procedures (including critical care time)  Medications Ordered in UC Medications - No data to display  Initial Impression / Assessment and Plan / UC Course  I have reviewed the triage vital signs and the nursing notes.  Pertinent labs & imaging results that were available during my care of the patient were reviewed by me and considered in my medical decision making (see chart for details).    Dysuria.  Treating with Keflex. Urine culture pending. Discussed with patient that we will call her if the urine culture shows the need to change or discontinue the antibiotic. Instructed her to follow-up with her PCP if her symptoms are not improving. Patient agrees to plan of care.     Final Clinical  Impressions(s) / UC Diagnoses   Final diagnoses:  Dysuria     Discharge Instructions      Take the antibiotic as  directed.  The urine culture is pending.  We will call you if it shows the need to change or discontinue your antibiotic.    Follow up with your primary care provider if your symptoms are not improving.        ED Prescriptions     Medication Sig Dispense Auth. Provider   cephALEXin (KEFLEX) 500 MG capsule Take 1 capsule (500 mg total) by mouth 2 (two) times daily for 5 days. 10 capsule Mickie Bail, NP      PDMP not reviewed this encounter.   Mickie Bail, NP 10/06/22 [phone]

## 2022-10-07 LAB — URINE CULTURE

## 2022-10-15 ENCOUNTER — Ambulatory Visit: Payer: BC Managed Care – PPO

## 2022-10-22 ENCOUNTER — Ambulatory Visit (INDEPENDENT_AMBULATORY_CARE_PROVIDER_SITE_OTHER): Payer: BC Managed Care – PPO

## 2022-10-22 DIAGNOSIS — E538 Deficiency of other specified B group vitamins: Secondary | ICD-10-CM

## 2022-10-22 MED ORDER — CYANOCOBALAMIN 1000 MCG/ML IJ SOLN
1000.0000 ug | Freq: Once | INTRAMUSCULAR | Status: AC
Start: 2022-10-22 — End: 2022-10-22
  Administered 2022-10-22: 1000 ug via INTRAMUSCULAR

## 2022-10-22 NOTE — Progress Notes (Signed)
Patient arrived for a B12 injection and it was administered into her right deltoid. Patient tolerated the injection well and did not show any signs of distress or voice any concerns. 

## 2022-10-28 ENCOUNTER — Encounter (INDEPENDENT_AMBULATORY_CARE_PROVIDER_SITE_OTHER): Payer: Self-pay

## 2022-11-10 ENCOUNTER — Ambulatory Visit: Payer: BC Managed Care – PPO

## 2022-11-10 ENCOUNTER — Encounter: Payer: Self-pay | Admitting: Internal Medicine

## 2022-11-10 DIAGNOSIS — K219 Gastro-esophageal reflux disease without esophagitis: Secondary | ICD-10-CM | POA: Diagnosis not present

## 2022-11-10 DIAGNOSIS — K3189 Other diseases of stomach and duodenum: Secondary | ICD-10-CM | POA: Diagnosis not present

## 2022-11-10 DIAGNOSIS — K2950 Unspecified chronic gastritis without bleeding: Secondary | ICD-10-CM | POA: Diagnosis not present

## 2022-11-10 DIAGNOSIS — R1013 Epigastric pain: Secondary | ICD-10-CM | POA: Diagnosis not present

## 2022-11-12 ENCOUNTER — Encounter (INDEPENDENT_AMBULATORY_CARE_PROVIDER_SITE_OTHER): Payer: Self-pay

## 2022-11-23 ENCOUNTER — Ambulatory Visit (INDEPENDENT_AMBULATORY_CARE_PROVIDER_SITE_OTHER): Payer: BC Managed Care – PPO

## 2022-11-23 DIAGNOSIS — E538 Deficiency of other specified B group vitamins: Secondary | ICD-10-CM | POA: Diagnosis not present

## 2022-11-23 MED ORDER — CYANOCOBALAMIN 1000 MCG/ML IJ SOLN
1000.0000 ug | Freq: Once | INTRAMUSCULAR | Status: AC
Start: 2022-11-23 — End: 2022-11-23
  Administered 2022-11-23: 1000 ug via INTRAMUSCULAR

## 2022-11-23 NOTE — Progress Notes (Signed)
Pt presented for their vitamin B12 injection. Pt was identified through two identifiers. Pt tolerated shot well in their left  deltoid.  

## 2022-12-03 ENCOUNTER — Ambulatory Visit: Payer: BC Managed Care – PPO | Admitting: Dermatology

## 2022-12-08 ENCOUNTER — Other Ambulatory Visit (INDEPENDENT_AMBULATORY_CARE_PROVIDER_SITE_OTHER): Payer: BC Managed Care – PPO

## 2022-12-08 DIAGNOSIS — E785 Hyperlipidemia, unspecified: Secondary | ICD-10-CM

## 2022-12-08 DIAGNOSIS — D72819 Decreased white blood cell count, unspecified: Secondary | ICD-10-CM

## 2022-12-08 LAB — CBC WITH DIFFERENTIAL/PLATELET
Basophils Absolute: 0 10*3/uL (ref 0.0–0.1)
Basophils Relative: 0.8 % (ref 0.0–3.0)
Eosinophils Absolute: 0 10*3/uL (ref 0.0–0.7)
Eosinophils Relative: 1.2 % (ref 0.0–5.0)
HCT: 43.4 % (ref 36.0–46.0)
Hemoglobin: 14.2 g/dL (ref 12.0–15.0)
Lymphocytes Relative: 32.5 % (ref 12.0–46.0)
Lymphs Abs: 1.2 10*3/uL (ref 0.7–4.0)
MCHC: 32.7 g/dL (ref 30.0–36.0)
MCV: 101.2 fl — ABNORMAL HIGH (ref 78.0–100.0)
Monocytes Absolute: 0.3 10*3/uL (ref 0.1–1.0)
Monocytes Relative: 7.7 % (ref 3.0–12.0)
Neutro Abs: 2.1 10*3/uL (ref 1.4–7.7)
Neutrophils Relative %: 57.8 % (ref 43.0–77.0)
Platelets: 160 10*3/uL (ref 150.0–400.0)
RBC: 4.29 Mil/uL (ref 3.87–5.11)
RDW: 12.9 % (ref 11.5–15.5)
WBC: 3.7 10*3/uL — ABNORMAL LOW (ref 4.0–10.5)

## 2022-12-08 LAB — LIPID PANEL
Cholesterol: 237 mg/dL — ABNORMAL HIGH (ref 0–200)
HDL: 94.6 mg/dL (ref >39.00)
LDL Cholesterol: 129 mg/dL — ABNORMAL HIGH (ref 0–99)
NonHDL: 141.94
Total CHOL/HDL Ratio: 3
Triglycerides: 64 mg/dL (ref 0.0–149.0)
VLDL: 12.8 mg/dL (ref 0.0–40.0)

## 2022-12-08 LAB — BASIC METABOLIC PANEL
BUN: 18 mg/dL (ref 6–23)
CO2: 30 meq/L (ref 19–32)
Calcium: 9.5 mg/dL (ref 8.4–10.5)
Chloride: 102 meq/L (ref 96–112)
Creatinine, Ser: 0.87 mg/dL (ref 0.40–1.20)
GFR: 70.57 mL/min (ref >60.00)
Glucose, Bld: 84 mg/dL (ref 70–99)
Potassium: 4.3 meq/L (ref 3.5–5.1)
Sodium: 138 meq/L (ref 135–145)

## 2022-12-08 LAB — HEPATIC FUNCTION PANEL
ALT: 27 U/L (ref 0–35)
AST: 30 U/L (ref 0–37)
Albumin: 4.2 g/dL (ref 3.5–5.2)
Alkaline Phosphatase: 33 U/L — ABNORMAL LOW (ref 39–117)
Bilirubin, Direct: 0.1 mg/dL (ref 0.0–0.3)
Total Bilirubin: 0.5 mg/dL (ref 0.2–1.2)
Total Protein: 6.9 g/dL (ref 6.0–8.3)

## 2022-12-10 ENCOUNTER — Encounter: Payer: Self-pay | Admitting: Internal Medicine

## 2022-12-10 ENCOUNTER — Other Ambulatory Visit (HOSPITAL_COMMUNITY)
Admission: RE | Admit: 2022-12-10 | Discharge: 2022-12-10 | Disposition: A | Discharge status: Home / Self Care | Payer: BC Managed Care – PPO | Source: Ambulatory Visit | Attending: Internal Medicine | Admitting: Internal Medicine

## 2022-12-10 ENCOUNTER — Ambulatory Visit (INDEPENDENT_AMBULATORY_CARE_PROVIDER_SITE_OTHER): Payer: BC Managed Care – PPO | Admitting: Internal Medicine

## 2022-12-10 VITALS — BP 114/70 | HR 54 | Temp 98.0°F | Ht 63.0 in | Wt 97.2 lb

## 2022-12-10 DIAGNOSIS — R1013 Epigastric pain: Secondary | ICD-10-CM

## 2022-12-10 DIAGNOSIS — R87619 Unspecified abnormal cytological findings in specimens from cervix uteri: Secondary | ICD-10-CM

## 2022-12-10 DIAGNOSIS — E538 Deficiency of other specified B group vitamins: Secondary | ICD-10-CM

## 2022-12-10 DIAGNOSIS — Z124 Encounter for screening for malignant neoplasm of cervix: Secondary | ICD-10-CM | POA: Diagnosis not present

## 2022-12-10 DIAGNOSIS — E785 Hyperlipidemia, unspecified: Secondary | ICD-10-CM

## 2022-12-10 DIAGNOSIS — Z8781 Personal history of (healed) traumatic fracture: Secondary | ICD-10-CM | POA: Diagnosis not present

## 2022-12-10 DIAGNOSIS — Z0000 Encounter for general adult medical examination without abnormal findings: Secondary | ICD-10-CM

## 2022-12-10 DIAGNOSIS — D696 Thrombocytopenia, unspecified: Secondary | ICD-10-CM

## 2022-12-10 DIAGNOSIS — Z23 Encounter for immunization: Secondary | ICD-10-CM

## 2022-12-10 DIAGNOSIS — M810 Age-related osteoporosis without current pathological fracture: Secondary | ICD-10-CM

## 2022-12-10 DIAGNOSIS — K219 Gastro-esophageal reflux disease without esophagitis: Secondary | ICD-10-CM

## 2022-12-10 NOTE — Assessment & Plan Note (Signed)
-  Continue B12 supplementation °

## 2022-12-10 NOTE — Progress Notes (Signed)
Subjective:    Patient ID: Leslie Davidson, female    DOB: 1960/05/11, 63 y.o.   MRN: [id number]  Patient here for  Chief Complaint  Patient presents with   Annual Exam    HPI Here for a physical exam, including pap. Saw GI 08/19/22 - f/u GERD. Recommended EGD.  EGD 11/17/22 - chronic gastris.  Had been seeing ortho for right knee pain - MRI - No meniscal or ligamentous injury of the right knee. Marrow edema in the proximal medial tibial metaphysis and to lesser extent medial epiphysis most concerning for stress reaction without a fracture. She reports she is doing relatively well.  No chest pain or sob reported.  No abdominal pain currently.  Stomach is doing better.  Bowels stable.  On omeprazole now. This is working better for her. Increased stress.  Husband diagnosed with bladder cancer.  Receiving chemotherapy. Handling things relatively well. Does not feel needs any further intervention. Reclast received.     Past Medical History:  Diagnosis Date   Arthritis    GERD (gastroesophageal reflux disease)    Past Surgical History:  Procedure Laterality Date   AUGMENTATION MAMMAPLASTY Bilateral    2000   BREAST ENHANCEMENT SURGERY  2000   HIP PINNING,CANNULATED Left 03/22/2021   Procedure: CANNULATED HIP PINNING;  Surgeon: Christena Flake, MD;  Location: [facility];  Service: Orthopedics;  Laterality: Left;   Family History  Problem Relation Age of Onset   Diabetes Mother    Cancer Father        Lung cancer   Arthritis Maternal Grandmother    Breast cancer Neg Hx    Social History   Socioeconomic History   Marital status: Married    Spouse name: Not on file   Number of children: Not on file   Years of education: Not on file   Highest education level: 12th grade  Occupational History   Not on file  Tobacco Use   Smoking status: Former    Current packs/day: 0.00    Types: Cigarettes    Quit date: 03/31/1983    Years since quitting: 39.7   Smokeless tobacco: Never  Substance and  Sexual Activity   Alcohol use: Yes    Alcohol/week: 0.0 standard drinks of alcohol    Comment: Occasional    Drug use: No   Sexual activity: Yes    Partners: Male    Birth control/protection: Post-menopausal    Comment: Husband  Other Topics Concern   Not on file  Social History Narrative   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      Quit smoking in 1985. Rare alcohol.    Social Determinants of Health   Financial Resource Strain: Low Risk  (08/04/2022)   Overall Financial Resource Strain (CARDIA)    Difficulty of Paying Living Expenses: Not hard at all  Food Insecurity: No Food Insecurity (08/04/2022)   Hunger Vital Sign    Worried About Running Out of Food in the Last Year: Never true    Ran Out of Food in the Last Year: Never true  Transportation Needs: No Transportation Needs (08/04/2022)   PRAPARE - Administrator, Civil Service (Medical): No    Lack of Transportation (Non-Medical): No  Physical Activity: Unknown (08/04/2022)   Exercise Vital Sign    Days of  Exercise per Week: 0 days    Minutes of Exercise per Session: Not on file  Stress: No Stress Concern Present (08/04/2022)   [employer] of Occupational Health - Occupational Stress Questionnaire    Feeling of Stress : Only a little  Social Connections: Moderately Integrated (08/04/2022)   Social Connection and Isolation Panel [NHANES]    Frequency of Communication with Friends and Family: Once a week    Frequency of Social Gatherings with Friends and Family: Once a week    Attends Religious Services: More than 4 times per year    Active Member of [employer] or Organizations: Yes    Attends Engineer, structural: More than 4 times per year    Marital Status: Married     Review of Systems  Constitutional:  Negative for appetite change and unexpected weight  change.  HENT:  Negative for congestion, sinus pressure and sore throat.   Eyes:  Negative for pain and visual disturbance.  Respiratory:  Negative for cough, chest tightness and shortness of breath.   Cardiovascular:  Negative for chest pain, palpitations and leg swelling.  Gastrointestinal:  Negative for abdominal pain, diarrhea, nausea and vomiting.  Genitourinary:  Negative for difficulty urinating and dysuria.  Musculoskeletal:  Negative for joint swelling and myalgias.  Skin:  Negative for color change and rash.  Neurological:  Negative for dizziness and headaches.  Hematological:  Negative for adenopathy. Does not bruise/bleed easily.  Psychiatric/Behavioral:  Negative for agitation and dysphoric mood.        Objective:     BP 114/70   Pulse (!) 54   Temp 98 F (36.7 C) (Oral)   Ht 5\' 3"  (1.6 m)   Wt 97 lb 3.2 oz (44.1 kg)   LMP 01/02/2013   SpO2 99%   BMI 17.22 kg/m  Wt Readings from Last 3 Encounters:  12/10/22 97 lb 3.2 oz (44.1 kg)  08/04/22 97 lb 12.8 oz (44.4 kg)  06/08/22 99 lb (44.9 kg)    Physical Exam Vitals reviewed.  Constitutional:      General: She is not in acute distress.    Appearance: Normal appearance. She is well-developed.  HENT:     Head: Normocephalic and atraumatic.     Right Ear: External ear normal.     Left Ear: External ear normal.  Eyes:     General: No scleral icterus.       Right eye: No discharge.        Left eye: No discharge.     Conjunctiva/sclera: Conjunctivae normal.  Neck:     Thyroid: No thyromegaly.  Cardiovascular:     Rate and Rhythm: Normal rate and regular rhythm.  Pulmonary:     Effort: No tachypnea, accessory muscle usage or respiratory distress.     Breath sounds: Normal breath sounds. No decreased breath sounds or wheezing.  Chest:  Breasts:    Right: No inverted nipple, mass, nipple discharge or tenderness (no axillary adenopathy).     Left: No inverted nipple, mass, nipple discharge or tenderness (no  axilarry adenopathy).  Abdominal:     General: Bowel sounds are normal.     Palpations: Abdomen is soft.     Tenderness: There is no abdominal tenderness.  Genitourinary:    Comments: Normal external genitalia.  Vaginal vault without lesions.  Some atrophy changes present. Cervix identified.  Pap smear performed.  Could not appreciate any adnexal masses or tenderness.   Musculoskeletal:        General:  No swelling or tenderness.     Cervical back: Neck supple.  Lymphadenopathy:     Cervical: No cervical adenopathy.  Skin:    Findings: No erythema or rash.  Neurological:     Mental Status: She is alert and oriented to person, place, and time.  Psychiatric:        Mood and Affect: Mood normal.        Behavior: Behavior normal.      Outpatient Encounter Medications as of 12/10/2022  Medication Sig   BIOTIN PO Take 1 tablet by mouth daily.   [DISCONTINUED] B Complex Vitamins (B COMPLEX PO) Take by mouth daily.   [DISCONTINUED] COLLAGEN PO Take by mouth.   [DISCONTINUED] RABEprazole (ACIPHEX) 20 MG tablet Take 1 tablet (20 mg total) by mouth 2 (two) times daily.   No facility-administered encounter medications on file as of 12/10/2022.     Lab Results  Component Value Date   WBC 3.7 (L) 12/08/2022   HGB 14.2 12/08/2022   HCT 43.4 12/08/2022   PLT 160.0 12/08/2022   GLUCOSE 84 12/08/2022   CHOL 237 (H) 12/08/2022   TRIG 64.0 12/08/2022   HDL 94.60 12/08/2022   LDLCALC 129 (H) 12/08/2022   ALT 27 12/08/2022   AST 30 12/08/2022   NA 138 12/08/2022   K 4.3 12/08/2022   CL 102 12/08/2022   CREATININE 0.87 12/08/2022   BUN 18 12/08/2022   CO2 30 12/08/2022   TSH 1.08 12/08/2021   HGBA1C 5.9 09/06/2018       Assessment & Plan:  Routine general medical examination at a health care facility  Abnormal cervical Papanicolaou smear, unspecified abnormal pap finding Assessment & Plan: PAP 11/2021 - negative with negative HPV.  Recommend f/u in one year.  Repeat pap today.     B12 deficiency Assessment & Plan: Continue B12 supplementation.    Healthcare maintenance Assessment & Plan: Physical today 12/10/22.  PAP 12/08/21.  Repeat today. Colonoscopy 05/2019.  Recommended f/u in 10 years.  08/21/22 - Birads I.    History of femur fracture Assessment & Plan: Bone density 07/22/21- osteoporosis.  Received reclast.    Screening for cervical cancer -     Cytology - PAP  Need for influenza vaccination -     Flu vaccine trivalent PF, 6mos and older(Flulaval,Afluria,Fluarix,Fluzone)  Abdominal pain, epigastric Assessment & Plan: Saw GI 08/19/22 - f/u GERD. Recommended EGD.  EGD 11/17/22 - chronic gastris. On prilosec now.  This is working better - controlling upper symptoms.  Follow. No pain now.    Gastroesophageal reflux disease without esophagitis Assessment & Plan: Saw GI 08/19/22 - f/u GERD. Recommended EGD.  EGD 11/17/22 - chronic gastris. On prilosec now.  This is working better - controlling upper symptoms.  Follow.    Hyperlipidemia, unspecified hyperlipidemia type Assessment & Plan: The 10-year ASCVD risk score (Arnett DK, et al., 2019) is: 3.4%   Values used to calculate the score:     Age: 87 years     Sex: Female     Is Non-Hispanic African American: No     Diabetic: No     Tobacco smoker: No     Systolic Blood Pressure: 114 mmHg     Is BP treated: No     HDL Cholesterol: 94.6 mg/dL     Total Cholesterol: 237 mg/dL  Continue diet and exercise.  Follow lipid panel.    Osteoporosis without current pathological fracture, unspecified osteoporosis type Assessment & Plan: Bone density 07/22/21- osteoporosis.  Received reclast.    Thrombocytopenia (HCC) Assessment & Plan: Chronic - mild.  Ultrasound - negative for liver disease.  Hepatitis, HIV, peripheral smear - normal.  Saw hematology.  Suspected ITP.  Recommended to continue to follow.  Treat if pts platelets are less than 50 or starts bleeding. Follow cbc.       Dale Belvidere,  MD

## 2022-12-10 NOTE — Assessment & Plan Note (Signed)
Physical today 12/10/22.  PAP 12/08/21.  Repeat today. Colonoscopy 05/2019.  Recommended f/u in 10 years.  08/21/22 - Birads I.

## 2022-12-10 NOTE — Assessment & Plan Note (Addendum)
PAP 11/2021 - negative with negative HPV.  Recommend f/u in one year.  Repeat pap today.

## 2022-12-10 NOTE — Assessment & Plan Note (Addendum)
The 10-year ASCVD risk score (Arnett DK, et al., 2019) is: 3.4%   Values used to calculate the score:     Age: 62 years     Sex: Female     Is Non-Hispanic African American: No     Diabetic: No     Tobacco smoker: No     Systolic Blood Pressure: 114 mmHg     Is BP treated: No     HDL Cholesterol: 94.6 mg/dL     Total Cholesterol: 237 mg/dL  Continue diet and exercise.  Follow lipid panel.

## 2022-12-10 NOTE — Assessment & Plan Note (Addendum)
Bone density 07/22/21- osteoporosis.  Received reclast.

## 2022-12-11 LAB — CYTOLOGY - PAP
Comment: NEGATIVE
Diagnosis: NEGATIVE
High risk HPV: NEGATIVE

## 2022-12-12 ENCOUNTER — Encounter: Payer: Self-pay | Admitting: Internal Medicine

## 2022-12-12 NOTE — Assessment & Plan Note (Signed)
Saw GI 08/19/22 - f/u GERD. Recommended EGD.  EGD 11/17/22 - chronic gastris. On prilosec now.  This is working better - controlling upper symptoms.  Follow.

## 2022-12-12 NOTE — Assessment & Plan Note (Addendum)
Saw GI 08/19/22 - f/u GERD. Recommended EGD.  EGD 11/17/22 - chronic gastris. On prilosec now.  This is working better - controlling upper symptoms.  Follow. No pain now.

## 2022-12-12 NOTE — Assessment & Plan Note (Signed)
Bone density 07/22/21- osteoporosis.  Received reclast.

## 2022-12-12 NOTE — Assessment & Plan Note (Signed)
Chronic - mild.  Ultrasound - negative for liver disease.  Hepatitis, HIV, peripheral smear - normal.  Saw hematology.  Suspected ITP.  Recommended to continue to follow.  Treat if pts platelets are less than 50 or starts bleeding. Follow cbc.

## 2022-12-24 ENCOUNTER — Ambulatory Visit: Payer: BC Managed Care – PPO

## 2022-12-25 ENCOUNTER — Ambulatory Visit (INDEPENDENT_AMBULATORY_CARE_PROVIDER_SITE_OTHER): Payer: BC Managed Care – PPO

## 2022-12-25 DIAGNOSIS — E538 Deficiency of other specified B group vitamins: Secondary | ICD-10-CM | POA: Diagnosis not present

## 2022-12-25 MED ORDER — CYANOCOBALAMIN 1000 MCG/ML IJ SOLN
1000.0000 ug | Freq: Once | INTRAMUSCULAR | Status: AC
Start: 2022-12-25 — End: 2022-12-25
  Administered 2022-12-25: 1000 ug via INTRAMUSCULAR

## 2022-12-25 NOTE — Progress Notes (Signed)
Pt presented for their vitamin B12 injection. Pt was identified through two identifiers. Pt tolerated shot well in their right deltoid.  

## 2023-02-01 ENCOUNTER — Encounter: Payer: Self-pay | Admitting: Internal Medicine

## 2023-02-08 ENCOUNTER — Telehealth: Payer: Self-pay

## 2023-02-08 ENCOUNTER — Encounter: Payer: Self-pay | Admitting: Internal Medicine

## 2023-02-08 ENCOUNTER — Ambulatory Visit (INDEPENDENT_AMBULATORY_CARE_PROVIDER_SITE_OTHER): Payer: 59

## 2023-02-08 DIAGNOSIS — E538 Deficiency of other specified B group vitamins: Secondary | ICD-10-CM | POA: Diagnosis not present

## 2023-02-08 MED ORDER — CYANOCOBALAMIN 1000 MCG/ML IJ SOLN
1000.0000 ug | Freq: Once | INTRAMUSCULAR | Status: AC
Start: 2023-02-08 — End: 2023-02-08
  Administered 2023-02-08: 1000 ug via INTRAMUSCULAR

## 2023-02-08 NOTE — Telephone Encounter (Signed)
Patient states she is interested in having a shingles vaccine.  Patient had a nurse visit today, so I spoke with Donavan Foil, CMA, who states patient has to speak with her provider about getting the shingles vaccine.  Please let us know if ok to schedule.

## 2023-02-08 NOTE — Progress Notes (Signed)
Pt presented for their vitamin B12 injection. Pt was identified through two identifiers. Pt tolerated shot well in their left  deltoid.  

## 2023-02-08 NOTE — Telephone Encounter (Signed)
Called patient to let her know that she is ok to get the shingles vaccine as long as she has not had any significant reaction to a vaccine and has not had shingles within the last year. Pt will get at pharmacy.

## 2023-03-15 ENCOUNTER — Ambulatory Visit (INDEPENDENT_AMBULATORY_CARE_PROVIDER_SITE_OTHER): Payer: 59

## 2023-03-15 DIAGNOSIS — E538 Deficiency of other specified B group vitamins: Secondary | ICD-10-CM

## 2023-03-15 MED ORDER — CYANOCOBALAMIN 1000 MCG/ML IJ SOLN
1000.0000 ug | Freq: Once | INTRAMUSCULAR | Status: AC
Start: 2023-03-15 — End: 2023-03-15
  Administered 2023-03-15: 1000 ug via INTRAMUSCULAR

## 2023-03-15 NOTE — Progress Notes (Signed)
Pt presented for their vitamin B12 injection. Pt was identified through two identifiers. Pt tolerated shot well in their right deltoid.  

## 2023-04-19 ENCOUNTER — Ambulatory Visit (INDEPENDENT_AMBULATORY_CARE_PROVIDER_SITE_OTHER): Payer: 59

## 2023-04-19 DIAGNOSIS — E538 Deficiency of other specified B group vitamins: Secondary | ICD-10-CM | POA: Diagnosis not present

## 2023-04-19 MED ORDER — CYANOCOBALAMIN 1000 MCG/ML IJ SOLN
1000.0000 ug | Freq: Once | INTRAMUSCULAR | Status: AC
Start: 2023-04-19 — End: 2023-04-19
  Administered 2023-04-19: 1000 ug via INTRAMUSCULAR

## 2023-04-19 NOTE — Progress Notes (Signed)
Pt presented for their vitamin B12 injection. Pt was identified through two identifiers. Pt tolerated shot well in their left  deltoid.  

## 2023-05-24 ENCOUNTER — Ambulatory Visit (INDEPENDENT_AMBULATORY_CARE_PROVIDER_SITE_OTHER): Payer: 59

## 2023-05-24 DIAGNOSIS — E538 Deficiency of other specified B group vitamins: Secondary | ICD-10-CM

## 2023-05-24 MED ORDER — CYANOCOBALAMIN 1000 MCG/ML IJ SOLN
1000.0000 ug | Freq: Once | INTRAMUSCULAR | Status: AC
Start: 2023-05-24 — End: 2023-05-24
  Administered 2023-05-24: 1000 ug via INTRAMUSCULAR

## 2023-05-24 NOTE — Progress Notes (Signed)
 Pt presented for their vitamin B12 injection. Pt was identified through two identifiers. Pt tolerated shot well in their right deltoid.

## 2023-05-25 ENCOUNTER — Ambulatory Visit (INDEPENDENT_AMBULATORY_CARE_PROVIDER_SITE_OTHER): Payer: 59 | Admitting: Dermatology

## 2023-05-25 ENCOUNTER — Encounter: Payer: Self-pay | Admitting: Dermatology

## 2023-05-25 DIAGNOSIS — Z79899 Other long term (current) drug therapy: Secondary | ICD-10-CM

## 2023-05-25 DIAGNOSIS — W908XXA Exposure to other nonionizing radiation, initial encounter: Secondary | ICD-10-CM | POA: Diagnosis not present

## 2023-05-25 DIAGNOSIS — Z872 Personal history of diseases of the skin and subcutaneous tissue: Secondary | ICD-10-CM

## 2023-05-25 DIAGNOSIS — L578 Other skin changes due to chronic exposure to nonionizing radiation: Secondary | ICD-10-CM

## 2023-05-25 DIAGNOSIS — Z1283 Encounter for screening for malignant neoplasm of skin: Secondary | ICD-10-CM

## 2023-05-25 DIAGNOSIS — D229 Melanocytic nevi, unspecified: Secondary | ICD-10-CM

## 2023-05-25 DIAGNOSIS — L738 Other specified follicular disorders: Secondary | ICD-10-CM

## 2023-05-25 DIAGNOSIS — L814 Other melanin hyperpigmentation: Secondary | ICD-10-CM

## 2023-05-25 DIAGNOSIS — Z7189 Other specified counseling: Secondary | ICD-10-CM

## 2023-05-25 DIAGNOSIS — L821 Other seborrheic keratosis: Secondary | ICD-10-CM

## 2023-05-25 DIAGNOSIS — D1801 Hemangioma of skin and subcutaneous tissue: Secondary | ICD-10-CM

## 2023-05-25 DIAGNOSIS — L650 Telogen effluvium: Secondary | ICD-10-CM

## 2023-05-25 NOTE — Progress Notes (Signed)
 Follow-Up Visit   Subjective  Leslie Davidson is a 63 y.o. female who presents for the following: Skin Cancer Screening and Full Body Skin Exam Spots at back she would like checked Hx of isks  The patient presents for Total-Body Skin Exam (TBSE) for skin cancer screening and mole check. The patient has spots, moles and lesions to be evaluated, some may be new or changing and the patient may have concern these could be cancer.  The following portions of the chart were reviewed this encounter and updated as appropriate: medications, allergies, medical history  Review of Systems:  No other skin or systemic complaints except as noted in HPI or Assessment and Plan.  Objective  Well appearing patient in no apparent distress; mood and affect are within normal limits.  A full examination was performed including scalp, head, eyes, ears, nose, lips, neck, chest, axillae, abdomen, back, buttocks, bilateral upper extremities, bilateral lower extremities, hands, feet, fingers, toes, fingernails, and toenails. All findings within normal limits unless otherwise noted below.   Relevant physical exam findings are noted in the Assessment and Plan.   Assessment & Plan   SKIN CANCER SCREENING PERFORMED TODAY.  ACTINIC DAMAGE - Chronic condition, secondary to cumulative UV/sun exposure - diffuse scaly erythematous macules with underlying dyspigmentation - Recommend daily broad spectrum sunscreen SPF 30+ to sun-exposed areas, reapply every 2 hours as needed.  - Staying in the shade or wearing long sleeves, sun glasses (UVA+UVB protection) and wide brim hats (4-inch brim around the entire circumference of the hat) are also recommended for sun protection.  - Call for new or changing lesions.  LENTIGINES, SEBORRHEIC KERATOSES, HEMANGIOMAS - Benign normal skin lesions - Benign-appearing - Call for any changes Sks at back  Patient declined treatment   MELANOCYTIC NEVI - Tan-brown and/or  pink-flesh-colored symmetric macules and papules - Benign appearing on exam today - Observation - Call clinic for new or changing moles - Recommend daily use of broad spectrum spf 30+ sunscreen to sun-exposed areas.   Dilated pore of Winer right nose Exam: dilated pore  Benign-appearing.  Observation.  Call clinic for new or changing moles.  Recommend daily use of broad spectrum spf 30+ sunscreen to sun-exposed areas.   TELOGEN EFFLUVIUM Possibly due to age and hormone changes - stabilized on oral Biotin treatment Exam: Diffuse thinning of hair, positive hair pull test.  Telogen effluvium is a benign, self-limited condition causing increased hair shedding usually for several months. It does not progress to baldness, and the hair eventually grows back on its own. It can be triggered by recent illness, recent surgery, thyroid disease, low iron stores, vitamin D deficiency, fad diets or rapid weight loss, hormonal changes such as pregnancy or birth control pills, and some medication. Usually the hair loss starts 2-3 months after the illness or health change. Rarely, it can continue for longer than a year. Treatments options may include oral or topical Minoxidil; Red Light scalp treatments; Biotin 2.5 mg daily and other options.  Treatment Plan: Recommend biotin 2.5 mg supplement daily   Discuss minoxidil as treatment in future - pt declines at this time  Long term medication management.  Patient is using long term (months to years) prescription medication  to control their dermatologic condition.  These medications require periodic monitoring to evaluate for efficacy and side effects and may require periodic laboratory monitoring.   Return for 1 - 2 year tbse .  IAsher Muir, CMA, am acting as scribe for Armida Sans,  MD.   Documentation: I have reviewed the above documentation for accuracy and completeness, and I agree with the above.  Armida Sans, MD

## 2023-05-25 NOTE — Patient Instructions (Addendum)
 Seborrheic Keratosis  What causes seborrheic keratoses? Seborrheic keratoses are harmless, common skin growths that first appear during adult life.  As time goes by, more growths appear.  Some people may develop a large number of them.  Seborrheic keratoses appear on both covered and uncovered body parts.  They are not caused by sunlight.  The tendency to develop seborrheic keratoses can be inherited.  They vary in color from skin-colored to gray, brown, or even black.  They can be either smooth or have a rough, warty surface.   Seborrheic keratoses are superficial and look as if they were stuck on the skin.  Under the microscope this type of keratosis looks like layers upon layers of skin.  That is why at times the top layer may seem to fall off, but the rest of the growth remains and re-grows.    Treatment Seborrheic keratoses do not need to be treated, but can easily be removed in the office.  Seborrheic keratoses often cause symptoms when they rub on clothing or jewelry.  Lesions can be in the way of shaving.  If they become inflamed, they can cause itching, soreness, or burning.  Removal of a seborrheic keratosis can be accomplished by freezing, burning, or surgery. If any spot bleeds, scabs, or grows rapidly, please return to have it checked, as these can be an indication of a skin cancer.   Melanoma ABCDEs  Melanoma is the most dangerous type of skin cancer, and is the leading cause of death from skin disease.  You are more likely to develop melanoma if you: Have light-colored skin, light-colored eyes, or red or blond hair Spend a lot of time in the sun Tan regularly, either outdoors or in a tanning bed Have had blistering sunburns, especially during childhood Have a close family member who has had a melanoma Have atypical moles or large birthmarks  Early detection of melanoma is key since treatment is typically straightforward and cure rates are extremely high if we catch it early.    The first sign of melanoma is often a change in a mole or a new dark spot.  The ABCDE system is a way of remembering the signs of melanoma.  A for asymmetry:  The two halves do not match. B for border:  The edges of the growth are irregular. C for color:  A mixture of colors are present instead of an even brown color. D for diameter:  Melanomas are usually (but not always) greater than 6mm - the size of a pencil eraser. E for evolution:  The spot keeps changing in size, shape, and color.  Please check your skin once per month between visits. You can use a small mirror in front and a large mirror behind you to keep an eye on the back side or your body.   If you see any new or changing lesions before your next follow-up, please call to schedule a visit.  Please continue daily skin protection including broad spectrum sunscreen SPF 30+ to sun-exposed areas, reapplying every 2 hours as needed when you're outdoors.   Staying in the shade or wearing long sleeves, sun glasses (UVA+UVB protection) and wide brim hats (4-inch brim around the entire circumference of the hat) are also recommended for sun protection.    Due to recent changes in healthcare laws, you may see results of your pathology and/or laboratory studies on MyChart before the doctors have had a chance to review them. We understand that in some cases there may  be results that are confusing or concerning to you. Please understand that not all results are received at the same time and often the doctors may need to interpret multiple results in order to provide you with the best plan of care or course of treatment. Therefore, we ask that you please give [place] 2 business days to thoroughly review all your results before contacting the office for clarification. Should we see a critical lab result, you will be contacted sooner.   If You Need Anything After Your Visit  If you have any questions or concerns for your doctor, please call our main  line at [phone] and press option 4 to reach your doctor's medical assistant. If no one answers, please leave a voicemail as directed and we will return your call as soon as possible. Messages left after 4 pm will be answered the following business day.   You may also send [place] a message via MyChart. We typically respond to MyChart messages within 1-2 business days.  For prescription refills, please ask your pharmacy to contact our office. Our fax number is [phone].  If you have an urgent issue when the clinic is closed that cannot wait until the next business day, you can page your doctor at the number below.    Please note that while we do our best to be available for urgent issues outside of office hours, we are not available 24/7.   If you have an urgent issue and are unable to reach [place], you may choose to seek medical care at your doctor's office, retail clinic, urgent care center, or emergency room.  If you have a medical emergency, please immediately call 911 or go to the emergency department.  Pager Numbers  - Dr. Gwen Pounds: [phone]  - Dr. Roseanne Reno: [phone]  - Dr. Katrinka Blazing: [phone]   In the event of inclement weather, please call our main line at [phone] for an update on the status of any delays or closures.  Dermatology Medication Tips: Please keep the boxes that topical medications come in in order to help keep track of the instructions about where and how to use these. Pharmacies typically print the medication instructions only on the boxes and not directly on the medication tubes.   If your medication is too expensive, please contact our office at [phone] option 4 or send [place] a message through MyChart.   We are unable to tell what your co-pay for medications will be in advance as this is different depending on your insurance coverage. However, we may be able to find a substitute medication at lower cost or fill out paperwork to get insurance to cover  a needed medication.   If a prior authorization is required to get your medication covered by your insurance company, please allow [place] 1-2 business days to complete this process.  Drug prices often vary depending on where the prescription is filled and some pharmacies may offer cheaper prices.  The website www.goodrx.com contains coupons for medications through different pharmacies. The prices here do not account for what the cost may be with help from insurance (it may be cheaper with your insurance), but the website can give you the price if you did not use any insurance.  - You can print the associated coupon and take it with your prescription to the pharmacy.  - You may also stop by our office during regular business hours and pick up a GoodRx coupon card.  - If you need your prescription sent electronically to  a different pharmacy, notify our office through [facility] or by phone at [phone] option 4.     Si Usted Necesita Algo Despus de Su Visita  Tambin puede enviarnos un mensaje a travs de Clinical cytogeneticist. Por lo general respondemos a los mensajes de MyChart en el transcurso de 1 a 2 das hbiles.  Para renovar recetas, por favor pida a su farmacia que se ponga en contacto con nuestra oficina. Annie Sable de fax es [place] [phone].  Si tiene un asunto urgente cuando la clnica est cerrada y que no puede esperar hasta el siguiente da hbil, puede llamar/localizar a su doctor(a) al nmero que aparece a continuacin.   Por favor, tenga en cuenta que aunque hacemos todo lo posible para estar disponibles para asuntos urgentes fuera del horario de [place], no estamos disponibles las 24 horas del da, los 7 [address]  [address] de la [place].   Si tiene un problema urgente y no puede comunicarse con nosotros, puede optar por buscar atencin mdica  en el consultorio de su doctor(a), en una clnica privada, en un centro de atencin urgente o en una sala de emergencias.  Si tiene Psychologist, clinical, por favor llame inmediatamente al 911 o vaya a la sala de emergencias.  Nmeros de bper  - Dr. Gwen Pounds: [phone]  - Dra. Roseanne Reno: [phone]  - Dr. Katrinka Blazing: [phone]   En caso de inclemencias del tiempo, por favor llame a Lacy Duverney principal al [phone] para una actualizacin sobre el [place] de cualquier retraso o cierre.  Consejos para la medicacin en dermatologa: Por favor, guarde las cajas en las que vienen los medicamentos de uso tpico para ayudarle a seguir las instrucciones sobre dnde y cmo usarlos. Las farmacias generalmente imprimen las instrucciones del medicamento slo en las cajas y no directamente en los tubos del [place].   Si su medicamento es muy caro, por favor, pngase en contacto con Rolm Gala llamando al [phone] y presione la opcin 4 o envenos un mensaje a travs de Clinical cytogeneticist.   No podemos decirle cul ser su copago por los medicamentos por adelantado ya que esto es diferente dependiendo de la cobertura de su seguro. Sin embargo, es posible que podamos encontrar un medicamento sustituto a Audiological scientist un formulario para que el seguro cubra el medicamento que se considera necesario.   Si se requiere una autorizacin previa para que su compaa de seguros [place] su medicamento, por favor permtanos de 1 a 2 das hbiles para completar [address]  Los precios de los medicamentos varan con frecuencia dependiendo del Environmental consultant de dnde se surte la receta y alguna farmacias pueden ofrecer precios ms baratos.  El sitio web www.goodrx.com tiene cupones para medicamentos de Health and safety inspector. Los precios aqu no tienen en cuenta lo que podra costar con la ayuda del seguro (puede ser ms barato con su seguro), pero el sitio web puede darle el precio si no utiliz Tourist information centre manager.  - Puede imprimir el cupn correspondiente y llevarlo con su receta a la farmacia.  - Tambin puede pasar por nuestra oficina durante el horario de  atencin regular y Education officer, museum una tarjeta de cupones de GoodRx.  - Si necesita que su receta se enve electrnicamente a una farmacia diferente, informe a nuestra oficina a travs de MyChart de [place] o por telfono llamando al [phone] y presione la opcin 4.

## 2023-06-09 ENCOUNTER — Ambulatory Visit: Payer: BC Managed Care – PPO | Admitting: Internal Medicine

## 2023-06-09 NOTE — Progress Notes (Unsigned)
 Subjective:    Patient ID: Leslie Davidson, female    DOB: May 15, 1960, 63 y.o.   MRN: [id number]  Patient here for No chief complaint on file.   HPI Here for a scheduled follow up - follow up regarding GERD and increased stress.  Husband diagnosed with bladder cancer. Seeing GI. S/p EGD 11/17/22 - chronic gastritis. Had f/u 04/08/23 - recommended tapering off omeprazole.    Past Medical History:  Diagnosis Date   Arthritis    GERD (gastroesophageal reflux disease)    Past Surgical History:  Procedure Laterality Date   AUGMENTATION MAMMAPLASTY Bilateral    2000   BREAST ENHANCEMENT SURGERY  2000   HIP PINNING,CANNULATED Left 03/22/2021   Procedure: CANNULATED HIP PINNING;  Surgeon: Christena Flake, MD;  Location: [facility] ORS;  Service: Orthopedics;  Laterality: Left;   Family History  Problem Relation Age of Onset   Diabetes Mother    Cancer Father        Lung cancer   Arthritis Maternal Grandmother    Breast cancer Neg Hx    Social History   Socioeconomic History   Marital status: Married    Spouse name: Not on file   Number of children: Not on file   Years of education: Not on file   Highest education level: 12th grade  Occupational History   Not on file  Tobacco Use   Smoking status: Former    Current packs/day: 0.00    Types: Cigarettes    Quit date: 03/31/1983    Years since quitting: 40.2   Smokeless tobacco: Never  Substance and Sexual Activity   Alcohol use: Yes    Alcohol/week: 0.0 standard drinks of alcohol    Comment: Occasional    Drug use: No   Sexual activity: Yes    Partners: Male    Birth control/protection: Post-menopausal    Comment: Husband  Other Topics Concern   Not on file  Social History Narrative   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      Quit smoking in 1985. Rare alcohol.    Social Drivers  of Corporate investment banker Strain: Low Risk  (06/05/2023)   Overall Financial Resource Strain (CARDIA)    Difficulty of Paying Living Expenses: Not hard at all  Food Insecurity: No Food Insecurity (06/05/2023)   Hunger Vital Sign    Worried About Running Out of Food in the Last Year: Never true    Ran Out of Food in the Last Year: Never true  Transportation Needs: Unmet Transportation Needs (06/05/2023)   PRAPARE - Administrator, Civil Service (Medical): Yes    Lack of Transportation (Non-Medical): No  Physical Activity: Unknown (06/05/2023)   Exercise Vital Sign    Days of Exercise per Week: 0 days    Minutes of Exercise per Session: Not on file  Stress: Stress Concern Present (06/05/2023)   [employer] of Occupational Health - Occupational Stress Questionnaire    Feeling of Stress : Very much  Social Connections: Socially Integrated (06/05/2023)   Social Connection and Isolation Panel [NHANES]    Frequency of Communication with Friends and Family: More than three times a week    Frequency of Social Gatherings with Friends and Family: Twice a week    Attends Religious Services: More than 4 times per  year    Active Member of Clubs or Organizations: No    Attends Banker Meetings: More than 4 times per year    Marital Status: Married     Review of Systems     Objective:     LMP 01/02/2013  Wt Readings from Last 3 Encounters:  12/10/22 97 lb 3.2 oz (44.1 kg)  08/04/22 97 lb 12.8 oz (44.4 kg)  06/08/22 99 lb (44.9 kg)    Physical Exam  {Perform Simple Foot Exam  Perform Detailed exam:1} {Insert foot Exam (Optional):30965}   Outpatient Encounter Medications as of 06/09/2023  Medication Sig   BIOTIN PO Take 1 tablet by mouth daily.   No facility-administered encounter medications on file as of 06/09/2023.     Lab Results  Component Value Date   WBC 3.7 (L) 12/08/2022   HGB 14.2 12/08/2022   HCT 43.4 12/08/2022   PLT 160.0 12/08/2022    GLUCOSE 84 12/08/2022   CHOL 237 (H) 12/08/2022   TRIG 64.0 12/08/2022   HDL 94.60 12/08/2022   LDLCALC 129 (H) 12/08/2022   ALT 27 12/08/2022   AST 30 12/08/2022   NA 138 12/08/2022   K 4.3 12/08/2022   CL 102 12/08/2022   CREATININE 0.87 12/08/2022   BUN 18 12/08/2022   CO2 30 12/08/2022   TSH 1.08 12/08/2021   HGBA1C 5.9 09/06/2018    No results found.     Assessment & Plan:  There are no diagnoses linked to this encounter.   Dale Washougal, MD

## 2023-06-11 ENCOUNTER — Ambulatory Visit: Admitting: Internal Medicine

## 2023-06-11 ENCOUNTER — Encounter: Payer: Self-pay | Admitting: Internal Medicine

## 2023-06-11 DIAGNOSIS — E785 Hyperlipidemia, unspecified: Secondary | ICD-10-CM

## 2023-06-11 DIAGNOSIS — D696 Thrombocytopenia, unspecified: Secondary | ICD-10-CM

## 2023-06-11 DIAGNOSIS — Z1231 Encounter for screening mammogram for malignant neoplasm of breast: Secondary | ICD-10-CM | POA: Diagnosis not present

## 2023-06-11 DIAGNOSIS — E538 Deficiency of other specified B group vitamins: Secondary | ICD-10-CM

## 2023-06-11 DIAGNOSIS — R079 Chest pain, unspecified: Secondary | ICD-10-CM

## 2023-06-11 DIAGNOSIS — K219 Gastro-esophageal reflux disease without esophagitis: Secondary | ICD-10-CM

## 2023-06-11 MED ORDER — ESCITALOPRAM OXALATE 10 MG PO TABS: 10.0000 mg | ORAL_TABLET | Freq: Every day | ORAL | 2 refills | Status: DC

## 2023-06-11 NOTE — Progress Notes (Signed)
 Subjective:    Patient ID: Leslie Davidson, female    DOB: [birth year], 63 y.o.   MRN: [id number]  Patient here for  Chief Complaint  Patient presents with   Medical Management of Chronic Issues    HPI Here for a scheduled follow up.  Saw GI 08/19/22 - f/u GERD. Recommended EGD.  EGD 11/17/22 - chronic gastris.  Has recently been off prilosec. Has noticed increased acid reflux recently.  Discussed restarting. Had been seeing ortho for right knee pain - MRI - No meniscal or ligamentous injury of the right knee. Marrow edema in the proximal medial tibial metaphysis and to lesser extent medial epiphysis most concerning for stress reaction without a fracture. Increased stress - husband diagnosed with bladder cancer and undergoing treatments. Also in the process of selling one house and buying another house (and moving). Discussed increased stress. Feels needs something to help level things out. Not exercising now. Has noticed some sob with exertion recently. Also has noticed some intermittent chest pain. The chest pain is not associated with exertion, but has noticed more sob with exertion.    Past Medical History:  Diagnosis Date   Arthritis    GERD (gastroesophageal reflux disease)    Past Surgical History:  Procedure Laterality Date   AUGMENTATION MAMMAPLASTY Bilateral    2000   BREAST ENHANCEMENT SURGERY  2000   HIP PINNING,CANNULATED Left 03/22/2021   Procedure: CANNULATED HIP PINNING;  Surgeon: Christena Flake, MD;  Location: [facility] ORS;  Service: Orthopedics;  Laterality: Left;   Family History  Problem Relation Age of Onset   Diabetes Mother    Cancer Father        Lung cancer   Arthritis Maternal Grandmother    Breast cancer Neg Hx    Social History   Socioeconomic History   Marital status: Married    Spouse name: Not on file   Number of children: Not on file   Years of education: Not on file   Highest education level: 12th grade  Occupational History   Not on file  Tobacco  Use   Smoking status: Former    Current packs/day: 0.00    Types: Cigarettes    Quit date: 03/31/1983    Years since quitting: 40.2   Smokeless tobacco: Never  Substance and Sexual Activity   Alcohol use: Yes    Alcohol/week: 0.0 standard drinks of alcohol    Comment: Occasional    Drug use: No   Sexual activity: Yes    Partners: Male    Birth control/protection: Post-menopausal    Comment: Husband  Other Topics Concern   Not on file  Social History Narrative   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      Quit smoking in 1985. Rare alcohol.    Social Drivers of Corporate investment banker Strain: Low Risk  (06/10/2023)   Overall Financial Resource Strain (CARDIA)    Difficulty of Paying Living Expenses: Not hard at all  Food Insecurity: No Food Insecurity (06/10/2023)   Hunger Vital Sign    Worried About Running Out of Food in the Last Year: Never true    Ran Out of Food in the Last Year: Never true  Transportation Needs: Unmet Transportation Needs (06/10/2023)   PRAPARE - Administrator, Civil Service (Medical): Yes  Lack of Transportation (Non-Medical): No  Physical Activity: Unknown (06/10/2023)   Exercise Vital Sign    Days of Exercise per Week: 0 days    Minutes of Exercise per Session: Not on file  Stress: Stress Concern Present (06/10/2023)   [employer] of Occupational Health - Occupational Stress Questionnaire    Feeling of Stress : Very much  Social Connections: Socially Integrated (06/10/2023)   Social Connection and Isolation Panel [NHANES]    Frequency of Communication with Friends and Family: More than three times a week    Frequency of Social Gatherings with Friends and Family: Twice a week    Attends Religious Services: More than 4 times per year    Active Member of [employer] or Organizations: No     Attends Engineer, structural: More than 4 times per year    Marital Status: Married     Review of Systems  Constitutional:  Negative for appetite change and unexpected weight change.  HENT:  Negative for congestion and sinus pressure.   Respiratory:  Positive for shortness of breath. Negative for cough and chest tightness.   Cardiovascular:  Positive for chest pain. Negative for palpitations and leg swelling.  Gastrointestinal:  Negative for abdominal pain, diarrhea, nausea and vomiting.  Genitourinary:  Negative for difficulty urinating and dysuria.  Musculoskeletal:  Negative for joint swelling and myalgias.  Skin:  Negative for color change and rash.  Neurological:  Negative for dizziness and headaches.  Psychiatric/Behavioral:         Increased stress as outlined. No SI.        Objective:     BP 124/70   Pulse 87   Temp 98.1 F (36.7 C) (Oral)   Ht 5\' 3"  (1.6 m)   Wt 93 lb 6.4 oz (42.4 kg)   LMP 01/02/2013   SpO2 99%   BMI 16.55 kg/m  Wt Readings from Last 3 Encounters:  06/11/23 93 lb 6.4 oz (42.4 kg)  12/10/22 97 lb 3.2 oz (44.1 kg)  08/04/22 97 lb 12.8 oz (44.4 kg)    Physical Exam Vitals reviewed.  Constitutional:      General: She is not in acute distress.    Appearance: Normal appearance.  HENT:     Head: Normocephalic and atraumatic.     Right Ear: External ear normal.     Left Ear: External ear normal.     Mouth/Throat:     Pharynx: No oropharyngeal exudate or posterior oropharyngeal erythema.  Eyes:     General: No scleral icterus.       Right eye: No discharge.        Left eye: No discharge.     Conjunctiva/sclera: Conjunctivae normal.  Neck:     Thyroid: No thyromegaly.  Cardiovascular:     Rate and Rhythm: Normal rate and regular rhythm.  Pulmonary:     Effort: No respiratory distress.     Breath sounds: Normal breath sounds. No wheezing.  Abdominal:     General: Bowel sounds are normal.     Palpations: Abdomen is soft.      Tenderness: There is no abdominal tenderness.  Musculoskeletal:        General: No swelling or tenderness.     Cervical back: Neck supple. No tenderness.  Lymphadenopathy:     Cervical: No cervical adenopathy.  Skin:    Findings: No erythema or rash.  Neurological:     Mental Status: She is alert.  Psychiatric:  Mood and Affect: Mood normal.        Behavior: Behavior normal.         Outpatient Encounter Medications as of 06/11/2023  Medication Sig   BIOTIN PO Take 1 tablet by mouth daily.   escitalopram (LEXAPRO) 10 MG tablet Take 1 tablet (10 mg total) by mouth daily.   omeprazole (PRILOSEC) 20 MG capsule Take 20 mg by mouth daily. Pt is taking 10 mg instead of 20 mg   No facility-administered encounter medications on file as of 06/11/2023.     Lab Results  Component Value Date   WBC 3.7 (L) 12/08/2022   HGB 14.2 12/08/2022   HCT 43.4 12/08/2022   PLT 160.0 12/08/2022   GLUCOSE 84 12/08/2022   CHOL 237 (H) 12/08/2022   TRIG 64.0 12/08/2022   HDL 94.60 12/08/2022   LDLCALC 129 (H) 12/08/2022   ALT 27 12/08/2022   AST 30 12/08/2022   NA 138 12/08/2022   K 4.3 12/08/2022   CL 102 12/08/2022   CREATININE 0.87 12/08/2022   BUN 18 12/08/2022   CO2 30 12/08/2022   TSH 1.08 12/08/2021   HGBA1C 5.9 09/06/2018       Assessment & Plan:  Encounter for screening mammogram for malignant neoplasm of breast -     3D Screening Mammogram w/Implants, Left and Right; Future  Chest pain, unspecified type Assessment & Plan: Describes chest pain as outlined. No specific trigger.  EKG obtained given sob with exertion - no acute change when compared to previous. Treat acid reflux. Given recent notice of sob with exertion, will have cardiology reevaluate for question of need for further cardiac testing.   Orders: -     EKG 12-Lead  Thrombocytopenia (HCC) Assessment & Plan: Chronic - mild.  Ultrasound - negative for liver disease.  Hepatitis, HIV, peripheral smear - normal.   Saw hematology.  Suspected ITP.  Recommended to continue to follow.  Treat if pts platelets are less than 50 or starts bleeding. Follow cbc.    Hyperlipidemia, unspecified hyperlipidemia type Assessment & Plan: The 10-year ASCVD risk score (Arnett DK, et al., 2019) is: 4%   Values used to calculate the score:     Age: 29 years     Sex: Female     Is Non-Hispanic African American: No     Diabetic: No     Tobacco smoker: No     Systolic Blood Pressure: 124 mmHg     Is BP treated: No     HDL Cholesterol: 94.6 mg/dL     Total Cholesterol: 237 mg/dL  Follow lipid panel.    Gastroesophageal reflux disease without esophagitis Assessment & Plan: Saw GI 08/19/22 - f/u GERD. Recommended EGD.  EGD 11/17/22 - chronic gastris. Off prilosec. Having issus with acid reflux as outlined. Restart prilosec. Follow.   Orders: -     Ambulatory referral to Cardiology  B12 deficiency Assessment & Plan: Continue B12 supplementation.    Other orders -     Escitalopram Oxalate; Take 1 tablet (10 mg total) by mouth daily.  Dispense: 30 tablet; Refill: 2     Dale [place], MD

## 2023-06-12 ENCOUNTER — Encounter: Payer: Self-pay | Admitting: Internal Medicine

## 2023-06-12 NOTE — Assessment & Plan Note (Signed)
 Saw GI 08/19/22 - f/u GERD. Recommended EGD.  EGD 11/17/22 - chronic gastris. Off prilosec. Having issus with acid reflux as outlined. Restart prilosec. Follow.

## 2023-06-12 NOTE — Assessment & Plan Note (Addendum)
 Describes chest pain as outlined. No specific trigger.  EKG obtained given sob with exertion - no acute change when compared to previous. Treat acid reflux. Given recent notice of sob with exertion, will have cardiology reevaluate for question of need for further cardiac testing.

## 2023-06-12 NOTE — Assessment & Plan Note (Signed)
-  Continue B12 supplementation °

## 2023-06-12 NOTE — Assessment & Plan Note (Signed)
Chronic - mild.  Ultrasound - negative for liver disease.  Hepatitis, HIV, peripheral smear - normal.  Saw hematology.  Suspected ITP.  Recommended to continue to follow.  Treat if pts platelets are less than 50 or starts bleeding. Follow cbc.

## 2023-06-12 NOTE — Assessment & Plan Note (Signed)
 The 10-year ASCVD risk score (Arnett DK, et al., 2019) is: 4%   Values used to calculate the score:     Age: 63 years     Sex: Female     Is Non-Hispanic African American: No     Diabetic: No     Tobacco smoker: No     Systolic Blood Pressure: 124 mmHg     Is BP treated: No     HDL Cholesterol: 94.6 mg/dL     Total Cholesterol: 237 mg/dL  Follow lipid panel.

## 2023-06-28 ENCOUNTER — Ambulatory Visit (INDEPENDENT_AMBULATORY_CARE_PROVIDER_SITE_OTHER): Payer: 59

## 2023-06-28 DIAGNOSIS — E538 Deficiency of other specified B group vitamins: Secondary | ICD-10-CM | POA: Diagnosis not present

## 2023-06-28 MED ORDER — CYANOCOBALAMIN 1000 MCG/ML IJ SOLN
1000.0000 ug | Freq: Once | INTRAMUSCULAR | Status: AC
  Administered 2023-06-28: 1000 ug via INTRAMUSCULAR

## 2023-06-28 NOTE — Progress Notes (Signed)
 Pt presented for their vitamin B12 injection. Pt was identified through two identifiers. Pt tolerated shot well in their left deltoid.

## 2023-07-04 ENCOUNTER — Other Ambulatory Visit: Payer: Self-pay | Admitting: Internal Medicine

## 2023-07-05 ENCOUNTER — Encounter: Payer: Self-pay | Admitting: Internal Medicine

## 2023-07-05 NOTE — Telephone Encounter (Signed)
 She has an upcoming appointment with you in 3 weeks. Does she need to taper off lexapro or just stop and see if symptoms resolve? She is taking 10 mg q day

## 2023-07-05 NOTE — Telephone Encounter (Signed)
 Per phone note, she did not tolerate lexapro. Rx declined.

## 2023-07-05 NOTE — Telephone Encounter (Signed)
 She can decrease to 1/2 tablet q day for a few days and then 1/2 tablet every other day for a few days and then stop. If when she decreases to 1/2 tablet and still having issues, especially issues with driving, just have her stop. Needs to schedule an appt to discuss other treatment.

## 2023-07-25 NOTE — Progress Notes (Unsigned)
 Subjective:    Patient ID: Leslie Davidson, female    DOB: [birth year], 63 y.o.   MRN: [id number]  Patient here for No chief complaint on file.   HPI Here for a scheduled follow up - follow up regarding increased stress and f/u regarding acid reflux. Restarted prilosec last visit. Also was started on lexapro . Did not tolerate lexapro .    Past Medical History:  Diagnosis Date   Arthritis    GERD (gastroesophageal reflux disease)    Past Surgical History:  Procedure Laterality Date   AUGMENTATION MAMMAPLASTY Bilateral    2000   BREAST ENHANCEMENT SURGERY  2000   HIP PINNING,CANNULATED Left 03/22/2021   Procedure: CANNULATED HIP PINNING;  Surgeon: Elner Hahn, MD;  Location: [facility];  Service: Orthopedics;  Laterality: Left;   Family History  Problem Relation Age of Onset   Diabetes Mother    Cancer Father        Lung cancer   Arthritis Maternal Grandmother    Breast cancer Neg Hx    Social History   Socioeconomic History   Marital status: Married    Spouse name: Not on file   Number of children: Not on file   Years of education: Not on file   Highest education level: 12th grade  Occupational History   Not on file  Tobacco Use   Smoking status: Former    Current packs/day: 0.00    Types: Cigarettes    Quit date: 03/31/1983    Years since quitting: 40.3   Smokeless tobacco: Never  Substance and Sexual Activity   Alcohol use: Yes    Alcohol/week: 0.0 standard drinks of alcohol    Comment: Occasional    Drug use: No   Sexual activity: Yes    Partners: Male    Birth control/protection: Post-menopausal    Comment: Husband  Other Topics Concern   Not on file  Social History Narrative   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      Quit smoking in 1985. Rare alcohol.    Social Drivers of Corporate investment banker  Strain: Low Risk  (06/10/2023)   Overall Financial Resource Strain (CARDIA)    Difficulty of Paying Living Expenses: Not hard at all  Food Insecurity: No Food Insecurity (06/10/2023)   Hunger Vital Sign    Worried About Running Out of Food in the Last Year: Never true    Ran Out of Food in the Last Year: Never true  Transportation Needs: Unmet Transportation Needs (06/10/2023)   PRAPARE - Administrator, Civil Service (Medical): Yes    Lack of Transportation (Non-Medical): No  Physical Activity: Unknown (06/10/2023)   Exercise Vital Sign    Days of Exercise per Week: 0 days    Minutes of Exercise per Session: Not on file  Stress: Stress Concern Present (06/10/2023)   [employer] of Occupational Health - Occupational Stress Questionnaire    Feeling of Stress : Very much  Social Connections: Socially Integrated (06/10/2023)   Social Connection and Isolation Panel [NHANES]    Frequency of Communication with Friends and Family: More than three times a week    Frequency of Social Gatherings with Friends and Family: Twice a week    Attends Religious Services: More than 4 times per year    Active Member  of Clubs or Organizations: No    Attends Banker Meetings: More than 4 times per year    Marital Status: Married     Review of Systems     Objective:     LMP 01/02/2013  Wt Readings from Last 3 Encounters:  06/11/23 93 lb 6.4 oz (42.4 kg)  12/10/22 97 lb 3.2 oz (44.1 kg)  08/04/22 97 lb 12.8 oz (44.4 kg)    Physical Exam  {Perform Simple Foot Exam  Perform Detailed exam:1} {Insert foot Exam (Optional):30965}   Outpatient Encounter Medications as of 07/26/2023  Medication Sig   BIOTIN PO Take 1 tablet by mouth daily.   escitalopram  (LEXAPRO ) 10 MG tablet Take 1 tablet (10 mg total) by mouth daily.   omeprazole (PRILOSEC) 20 MG capsule Take 20 mg by mouth daily. Pt is taking 10 mg instead of 20 mg   No facility-administered encounter medications on  file as of 07/26/2023.     Lab Results  Component Value Date   WBC 3.7 (L) 12/08/2022   HGB 14.2 12/08/2022   HCT 43.4 12/08/2022   PLT 160.0 12/08/2022   GLUCOSE 84 12/08/2022   CHOL 237 (H) 12/08/2022   TRIG 64.0 12/08/2022   HDL 94.60 12/08/2022   LDLCALC 129 (H) 12/08/2022   ALT 27 12/08/2022   AST 30 12/08/2022   NA 138 12/08/2022   K 4.3 12/08/2022   CL 102 12/08/2022   CREATININE 0.87 12/08/2022   BUN 18 12/08/2022   CO2 30 12/08/2022   TSH 1.08 12/08/2021   HGBA1C 5.9 09/06/2018    No results found.     Assessment & Plan:  There are no diagnoses linked to this encounter.   Dellar Fenton, MD

## 2023-07-26 ENCOUNTER — Encounter: Payer: Self-pay | Admitting: Internal Medicine

## 2023-07-26 ENCOUNTER — Ambulatory Visit: Admitting: Internal Medicine

## 2023-07-26 VITALS — BP 118/70 | HR 70 | Temp 98.2°F | Resp 16 | Ht 63.0 in | Wt 95.4 lb

## 2023-07-26 DIAGNOSIS — F439 Reaction to severe stress, unspecified: Secondary | ICD-10-CM | POA: Diagnosis not present

## 2023-07-26 DIAGNOSIS — H6122 Impacted cerumen, left ear: Secondary | ICD-10-CM | POA: Diagnosis not present

## 2023-07-26 DIAGNOSIS — E538 Deficiency of other specified B group vitamins: Secondary | ICD-10-CM

## 2023-07-26 DIAGNOSIS — K219 Gastro-esophageal reflux disease without esophagitis: Secondary | ICD-10-CM

## 2023-07-26 DIAGNOSIS — H6120 Impacted cerumen, unspecified ear: Secondary | ICD-10-CM | POA: Insufficient documentation

## 2023-07-26 MED ORDER — BUSPIRONE HCL 5 MG PO TABS: 5.0000 mg | ORAL_TABLET | Freq: Every day | ORAL | 2 refills | Status: DC

## 2023-07-26 NOTE — Assessment & Plan Note (Signed)
 B12 injection given today.

## 2023-07-26 NOTE — Assessment & Plan Note (Signed)
 Increased stress/anxiety. Discussed. Did not tolerate lexapro . Zoloft did not work. Trial of buspar as directed. Follow. Call with update.

## 2023-07-26 NOTE — Assessment & Plan Note (Signed)
 Saw GI 08/19/22 - f/u GERD. Recommended EGD.  EGD 11/17/22 - chronic gastris. Restarted prilosec last visit. No upper symptoms now. Will continue prilosec for now. Follow

## 2023-07-26 NOTE — Assessment & Plan Note (Signed)
 Left ear - cerumen impaction. Some intermittent ear discomfort. No pain last couple of days. Will have ENT remove cerumen.

## 2023-07-27 MED ORDER — CYANOCOBALAMIN 1000 MCG/ML IJ SOLN
1000.0000 ug | Freq: Once | INTRAMUSCULAR | Status: AC
  Administered 2023-07-26: 1000 ug via INTRAMUSCULAR

## 2023-07-27 NOTE — Addendum Note (Signed)
 Addended by: Victorino Grates D on: 07/27/2023 09:06 AM   Modules accepted: Orders

## 2023-07-29 ENCOUNTER — Encounter: Payer: Self-pay | Admitting: Internal Medicine

## 2023-07-29 MED ORDER — BUSPIRONE HCL 5 MG PO TABS: 5.0000 mg | ORAL_TABLET | Freq: Two times a day (BID) | ORAL | 1 refills | Status: DC

## 2023-07-29 NOTE — Telephone Encounter (Signed)
 Rx ok'd for buspar  5mg  bid.  Pt notified via my chart.

## 2023-07-30 NOTE — Telephone Encounter (Signed)
 Patient is aware of below. She has a f/u in 4 weeks

## 2023-07-30 NOTE — Telephone Encounter (Signed)
 Arer you ok with her stopping buspar  since she did not increase to BID.

## 2023-07-30 NOTE — Telephone Encounter (Signed)
 If she is feeling worse, recommend holding the medication. Confirm symptoms improve.  Can schedule f/u to discuss other treatment if desires.

## 2023-08-24 ENCOUNTER — Ambulatory Visit
Admission: RE | Admit: 2023-08-24 | Discharge: 2023-08-24 | Disposition: A | Discharge status: Home / Self Care | Source: Ambulatory Visit | Attending: Internal Medicine | Admitting: Internal Medicine

## 2023-08-24 DIAGNOSIS — Z1231 Encounter for screening mammogram for malignant neoplasm of breast: Secondary | ICD-10-CM | POA: Diagnosis present

## 2023-08-30 ENCOUNTER — Ambulatory Visit (INDEPENDENT_AMBULATORY_CARE_PROVIDER_SITE_OTHER)

## 2023-08-30 DIAGNOSIS — E538 Deficiency of other specified B group vitamins: Secondary | ICD-10-CM

## 2023-08-30 MED ORDER — CYANOCOBALAMIN 1000 MCG/ML IJ SOLN
1000.0000 ug | Freq: Once | INTRAMUSCULAR | Status: AC
Start: 2023-08-30 — End: 2023-08-30
  Administered 2023-08-30: 1000 ug via INTRAMUSCULAR

## 2023-08-30 NOTE — Progress Notes (Signed)
 Pt presented for their vitamin B12 injection. Pt was identified through two identifiers. Pt tolerated shot well in their right deltoid.

## 2023-09-16 ENCOUNTER — Ambulatory Visit
Admission: RE | Admit: 2023-09-16 | Discharge: 2023-09-16 | Disposition: A | Discharge status: Home / Self Care | Source: Ambulatory Visit | Attending: Emergency Medicine | Admitting: Emergency Medicine

## 2023-09-16 VITALS — BP 112/73 | HR 79 | Temp 98.4°F | Resp 16

## 2023-09-16 DIAGNOSIS — J069 Acute upper respiratory infection, unspecified: Secondary | ICD-10-CM

## 2023-09-16 MED ORDER — PROMETHAZINE-DM 6.25-15 MG/5ML PO SYRP: 5.0000 mL | ORAL_SOLUTION | Freq: Four times a day (QID) | ORAL | 0 refills | Status: DC | PRN

## 2023-09-16 MED ORDER — AMOXICILLIN-POT CLAVULANATE 875-125 MG PO TABS: 1.0000 | ORAL_TABLET | Freq: Two times a day (BID) | ORAL | 0 refills | Status: AC

## 2023-09-16 MED ORDER — IPRATROPIUM BROMIDE 0.06 % NA SOLN: 2.0000 | Freq: Four times a day (QID) | NASAL | 12 refills | Status: DC

## 2023-09-16 MED ORDER — BENZONATATE 100 MG PO CAPS: 200.0000 mg | ORAL_CAPSULE | Freq: Three times a day (TID) | ORAL | 0 refills | Status: DC

## 2023-09-16 NOTE — Discharge Instructions (Addendum)
 Take the Augmentin 875 mg twice daily with food for 7 days for treatment of your upper respiratory tract infection.  Use the Atrovent nasal spray, 2 squirts in each nostril every 6 hours, as needed for runny nose and postnasal drip.  Use the Tessalon Perles every 8 hours during the day.  Take them with a small sip of water.  They may give you some numbness to the base of your tongue or a metallic taste in your mouth, this is normal.  Use the Promethazine  DM cough syrup at bedtime for cough and congestion.  It will make you drowsy so do not take it during the day.  Return for reevaluation or see your primary care provider for any new or worsening symptoms.

## 2023-09-16 NOTE — ED Triage Notes (Signed)
 Pt presents with a dry cough and congestion x 2 weeks. She has tried OTC cold medication with no relief.

## 2023-09-16 NOTE — ED Provider Notes (Signed)
 [facility]    CSN: [id number] Arrival date & time: 09/16/23  1422      History   Chief Complaint Chief Complaint  Patient presents with   Cough    Congestion and cough for 2 weeks - Entered by patient   Nasal Congestion    HPI Leslie Davidson is a 63 y.o. female.   HPI  63 year old female with past medical history significant for GERD, hyperlipidemia, B12 deficiency, thrombocytopenia, and depression presents for evaluation of 2 weeks worth of nasal congestion with green nasal discharge, and a nonproductive cough.  She denies any fever, ear pain, or sore throat.  She does endorse that she had a sore throat at the onset of symptoms but that has long resolved.  No shortness of breath or wheezing  Past Medical History:  Diagnosis Date   Arthritis    GERD (gastroesophageal reflux disease)     Patient Active Problem List   Diagnosis Date Noted   Cerumen impaction 07/26/2023   Stress 07/26/2023   Chest pressure 06/14/2022   Osteoporosis 12/08/2021   History of femur fracture 06/14/2021   Closed displaced fracture of left femoral neck (HCC) 03/22/2021   Preoperative clearance 03/22/2021   Accidental fall 03/22/2021   Closed left hip fracture (HCC) 03/22/2021   Abnormal Pap smear of cervix 01/15/2021   Depressive disorder, not elsewhere classified 01/15/2021   Incontinence 12/08/2020   Thrombocytopenia (HCC) 06/03/2020   Chest pain 12/10/2019   B12 deficiency 09/22/2018   Healthcare maintenance 08/22/2016   Abdominal pain, epigastric 11/14/2015   Abnormal mammogram 05/27/2015   Hyperlipidemia 04/22/2015   GERD (gastroesophageal reflux disease) 11/09/2014   Arthritis 11/09/2014   Edema 11/26/2011   Macrocytosis 07/16/2011    Past Surgical History:  Procedure Laterality Date   AUGMENTATION MAMMAPLASTY Bilateral    2000   BREAST ENHANCEMENT SURGERY  2000   HIP PINNING,CANNULATED Left 03/22/2021   Procedure: CANNULATED HIP PINNING;  Surgeon: Elner Hahn,  MD;  Location: [facility];  Service: Orthopedics;  Laterality: Left;    OB History   No obstetric history on file.      Home Medications    Prior to Admission medications   Medication Sig Start Date End Date Taking? Authorizing Provider  amoxicillin-clavulanate (AUGMENTIN) 875-125 MG tablet Take 1 tablet by mouth every 12 (twelve) hours for 7 days. 09/16/23 09/23/23 Yes Kent Pear, NP  benzonatate (TESSALON) 100 MG capsule Take 2 capsules (200 mg total) by mouth every 8 (eight) hours. 09/16/23  Yes Kent Pear, NP  ipratropium (ATROVENT) 0.06 % nasal spray Place 2 sprays into both nostrils 4 (four) times daily. 09/16/23  Yes Kent Pear, NP  promethazine -dextromethorphan (PROMETHAZINE -DM) 6.25-15 MG/5ML syrup Take 5 mLs by mouth 4 (four) times daily as needed. 09/16/23  Yes Kent Pear, NP  BIOTIN PO Take 1 tablet by mouth daily.    [provider]  busPIRone  (BUSPAR ) 5 MG tablet Take 1 tablet (5 mg total) by mouth 2 (two) times daily. 07/29/23   Dellar Fenton, MD  omeprazole (PRILOSEC) 20 MG capsule Take 20 mg by mouth daily. Pt is taking 10 mg instead of 20 mg    [provider]    Family History Family History  Problem Relation Age of Onset   Diabetes Mother    Cancer Father        Lung cancer   Arthritis Maternal Grandmother    Breast cancer Neg Hx     Social History Social History  Tobacco Use   Smoking status: Former    Current packs/day: 0.00    Types: Cigarettes    Quit date: 03/31/1983    Years since quitting: 40.4   Smokeless tobacco: Never  Vaping Use   Vaping status: Never Used  Substance Use Topics   Alcohol use: Yes    Alcohol/week: 0.0 standard drinks of alcohol    Comment: Occasional    Drug use: No     Allergies   Patient has no known allergies.   Review of Systems Review of Systems  Constitutional:  Negative for fever.  HENT:  Positive for congestion and rhinorrhea. Negative for ear pain and sore throat.   Respiratory:   Positive for cough. Negative for shortness of breath and wheezing.      Physical Exam Triage Vital Signs ED Triage Vitals  Encounter Vitals Group     BP 09/16/23 1448 112/73     Girls Systolic BP Percentile --      Girls Diastolic BP Percentile --      Boys Systolic BP Percentile --      Boys Diastolic BP Percentile --      Pulse Rate 09/16/23 1448 79     Resp 09/16/23 1448 16     Temp 09/16/23 1448 98.4 F (36.9 C)     Temp Source 09/16/23 1448 Oral     SpO2 09/16/23 1448 95 %     Weight --      Height --      Head Circumference --      Peak Flow --      Pain Score 09/16/23 1447 0     Pain Loc --      Pain Education --      Exclude from Growth Chart --    No data found.  Updated Vital Signs BP 112/73 (BP Location: Right Arm)   Pulse 79   Temp 98.4 F (36.9 C) (Oral)   Resp 16   LMP 01/02/2013   SpO2 95%   Visual Acuity Right Eye Distance:   Left Eye Distance:   Bilateral Distance:    Right Eye Near:   Left Eye Near:    Bilateral Near:     Physical Exam Vitals and nursing note reviewed.  Constitutional:      Appearance: Normal appearance. She is not ill-appearing.  HENT:     Head: Normocephalic and atraumatic.     Right Ear: Tympanic membrane, ear canal and external ear normal. There is no impacted cerumen.     Left Ear: There is impacted cerumen.     Ears:     Comments: Left EAC is occluded by cerumen.    Nose: Congestion and rhinorrhea present.     Comments: This mucosa is edematous and erythematous with yellow discharge in both nares.    Mouth/Throat:     Mouth: Mucous membranes are moist.     Pharynx: Oropharynx is clear. No oropharyngeal exudate or posterior oropharyngeal erythema.   Cardiovascular:     Rate and Rhythm: Normal rate and regular rhythm.     Pulses: Normal pulses.     Heart sounds: Normal heart sounds. No murmur heard.    No friction rub. No gallop.  Pulmonary:     Effort: Pulmonary effort is normal.     Breath sounds: Normal  breath sounds. No wheezing, rhonchi or rales.   Musculoskeletal:     Cervical back: Normal range of motion and neck supple. No tenderness.  Lymphadenopathy:  Cervical: No cervical adenopathy.   Skin:    General: Skin is warm and dry.     Capillary Refill: Capillary refill takes less than 2 seconds.     Findings: No rash.   Neurological:     General: No focal deficit present.     Mental Status: She is alert and oriented to person, place, and time.      UC Treatments / Results  Labs (all labs ordered are listed, but only abnormal results are displayed) Labs Reviewed - No data to display  EKG   Radiology No results found.  Procedures Procedures (including critical care time)  Medications Ordered in UC Medications - No data to display  Initial Impression / Assessment and Plan / UC Course  I have reviewed the triage vital signs and the nursing notes.  Pertinent labs & imaging results that were available during my care of the patient were reviewed by me and considered in my medical decision making (see chart for details).   Patient is a pleasant, nontoxic-appearing 63 year old female presenting for evaluation 2 weeks for the respiratory symptoms as outlined HPI above.  She is reporting colored nasal discharge and nasal congestion has been going on for 2 weeks but reports that her cough has been nonproductive.  She is able to speak in full sentence without dyspnea or tachypnea and her lungs are clear to auscultation in all fields.  Her physical exam does reveal inflamed nasal mucosa with yellow discharge in both nares.  Additionally, she has a cerumen impaction in her left ear, however, she reports that she has an appointment with ENT for next week to have the cerumen removed.  Given that she is not complaining of any ear pain I will not order an ear lavage at this time.  I do think that the patients cough is being driven by postnasal drip from an upper respiratory tract  infection.  Given that she has had symptoms for 2 weeks a trial of antibiotics is warranted.  I will discharge her home with a diagnosis of URI with cough and congestion on Augmentin dater and 75 mg twice daily with food for 7 days.  Atrovent nasal spray for the nasal congestion.  Tessalon Perles and Promethazine  DM cough syrup for cough and congestion.  Return precautions reviewed.   Final Clinical Impressions(s) / UC Diagnoses   Final diagnoses:  URI with cough and congestion     Discharge Instructions      Take the Augmentin 875 mg twice daily with food for 7 days for treatment of your upper respiratory tract infection.  Use the Atrovent nasal spray, 2 squirts in each nostril every 6 hours, as needed for runny nose and postnasal drip.  Use the Tessalon Perles every 8 hours during the day.  Take them with a small sip of water.  They may give you some numbness to the base of your tongue or a metallic taste in your mouth, this is normal.  Use the Promethazine  DM cough syrup at bedtime for cough and congestion.  It will make you drowsy so do not take it during the day.  Return for reevaluation or see your primary care provider for any new or worsening symptoms.      ED Prescriptions     Medication Sig Dispense Auth. Provider   amoxicillin-clavulanate (AUGMENTIN) 875-125 MG tablet Take 1 tablet by mouth every 12 (twelve) hours for 7 days. 14 tablet Kent Pear, NP   benzonatate (TESSALON) 100 MG capsule Take  2 capsules (200 mg total) by mouth every 8 (eight) hours. 21 capsule Kent Pear, NP   ipratropium (ATROVENT) 0.06 % nasal spray Place 2 sprays into both nostrils 4 (four) times daily. 15 mL Kent Pear, NP   promethazine -dextromethorphan (PROMETHAZINE -DM) 6.25-15 MG/5ML syrup Take 5 mLs by mouth 4 (four) times daily as needed. 118 mL Kent Pear, NP      PDMP not reviewed this encounter.   Kent Pear, NP 09/16/23 1459

## 2023-09-20 ENCOUNTER — Ambulatory Visit: Admitting: Internal Medicine

## 2023-09-20 NOTE — Progress Notes (Unsigned)
 Cardiology Office Note  Date:  09/21/2023   ID:  Leslie Davidson, DOB Aug 07, 1960, MRN [id number]  PCP:  Glendia Shad, MD   Chief Complaint  Patient presents with   New Patient (Initial Visit)    Ref by Shad Glendia, MD for chest pain and shortness of breath on exertion.     HPI:  Ms. Leslie Davidson is a very pleasant 63 year old woman with past medical history of GERD Stress Former smoker, quit 1985 Who presents by referral from Dr. Glendia for consultation of her chest pain and shortness of breath on exertion  Reports last year she was doing Pilates, on bending over developed some shortness of breath She attributed her symptoms to the maneuvers from the Pilates and her GERD Since then has not had significant chest pain or shortness of breath on exertion Prior exercise regimen included treadmill, walking her driveway  She has been less active recently as she hurt her knee Getting her house ready to sell, has been very active, stressful time Husband with medical issues, she is downsizing  Denies shortness of breath or chest pain on activity around the house  Interested in calcium scoring  Lab work last year 2024 Total cholesterol 237 LDL 129  Family history Mother has diabetes, high cholesterol Father died of lung cancer   EKG on today's visit shows normal sinus rhythm with  EKG Interpretation Date/Time:  Tuesday September 21 2023 09:10:40 EDT Ventricular Rate:  67 PR Interval:  160 QRS Duration:  72 QT Interval:  404 QTC Calculation: 426 R Axis:   63  Text Interpretation: Normal sinus rhythm When compared with ECG of 03-Dec-2019 06:43, No significant change was found Confirmed by Perla Lye [phone]) on 09/21/2023 9:18:07 AM    PMH:   has a past medical history of Arthritis and GERD (gastroesophageal reflux disease).  PSH:    Past Surgical History:  Procedure Laterality Date   AUGMENTATION MAMMAPLASTY Bilateral    2000   BREAST ENHANCEMENT SURGERY  2000   HIP  PINNING,CANNULATED Left 03/22/2021   Procedure: CANNULATED HIP PINNING;  Surgeon: Edie Norleen PARAS, MD;  Location: [facility] OR[facility];  Service: Orthopedics;  Laterality: Left;    Current Outpatient Medications  Medication Sig Dispense Refill   amoxicillin -clavulanate (AUGMENTIN ) 875-125 MG tablet Take 1 tablet by mouth every 12 (twelve) hours for 7 days. 14 tablet 0   BIOTIN PO Take 1 tablet by mouth daily.     omeprazole (PRILOSEC) 20 MG capsule Take 20 mg by mouth daily. Pt is taking 10 mg instead of 20 mg     benzonatate  (TESSALON ) 100 MG capsule Take 2 capsules (200 mg total) by mouth every 8 (eight) hours. (Patient not taking: Reported on 09/21/2023) 21 capsule 0   busPIRone  (BUSPAR ) 5 MG tablet Take 1 tablet (5 mg total) by mouth 2 (two) times daily. 60 tablet 1   ipratropium (ATROVENT ) 0.06 % nasal spray Place 2 sprays into both nostrils 4 (four) times daily. (Patient not taking: Reported on 09/21/2023) 15 mL 12   promethazine -dextromethorphan (PROMETHAZINE -DM) 6.25-15 MG/5ML syrup Take 5 mLs by mouth 4 (four) times daily as needed. (Patient not taking: Reported on 09/21/2023) 118 mL 0   No current facility-administered medications for this visit.     Allergies:   Patient has no known allergies.   Social History:  The patient  reports that she quit smoking about 40 years ago. Her smoking use included cigarettes. She has never used smokeless tobacco. She reports current alcohol use. She reports  that she does not use drugs.   Family History:   family history includes Arthritis in her maternal grandmother; Cancer in her father; Diabetes in her mother.    Review of Systems: Review of Systems  Constitutional: Negative.   HENT: Negative.    Respiratory: Negative.    Cardiovascular: Negative.   Gastrointestinal: Negative.   Musculoskeletal: Negative.   Neurological: Negative.   Psychiatric/Behavioral: Negative.    All other systems reviewed and are negative.  PHYSICAL EXAM: VS:  BP 100/60 (BP  Location: Right Arm, Patient Position: Sitting, Cuff Size: Normal)   Pulse 67   Ht 5' 3 (1.6 m)   Wt 94 lb 3.2 oz (42.7 kg)   LMP 01/02/2013   SpO2 99%   BMI 16.69 kg/m  , BMI Body mass index is 16.69 kg/m. GEN: Well nourished, well developed, in no acute distress HEENT: normal Neck: no JVD, carotid bruits, or masses Cardiac: RRR; no murmurs, rubs, or gallops,no edema  Respiratory:  clear to auscultation bilaterally, normal work of breathing GI: soft, nontender, nondistended, + BS MS: no deformity or atrophy Skin: warm and dry, no rash Neuro:  Strength and sensation are intact Psych: euthymic mood, full affect  Recent Labs: 12/08/2022: ALT 27; BUN 18; Creatinine, Ser 0.87; Hemoglobin 14.2; Platelets 160.0; Potassium 4.3; Sodium 138   Lipid Panel Lab Results  Component Value Date   CHOL 237 (H) 12/08/2022   HDL 94.60 12/08/2022   LDLCALC 129 (H) 12/08/2022   TRIG 64.0 12/08/2022     Wt Readings from Last 3 Encounters:  09/21/23 94 lb 3.2 oz (42.7 kg)  07/26/23 95 lb 6.4 oz (43.3 kg)  06/11/23 93 lb 6.4 oz (42.4 kg)     ASSESSMENT AND PLAN:  Problem List Items Addressed This Visit       Cardiology Problems   Hyperlipidemia - Primary   Relevant Orders   CT CARDIAC SCORING (SELF PAY ONLY)     Other   Chest pain   Relevant Orders   EKG 12-Lead (Completed)   CT CARDIAC SCORING (SELF PAY ONLY)   Other Visit Diagnoses       DOE (dyspnea on exertion)       Relevant Orders   EKG 12-Lead (Completed)      Shortness of breath/chest pain Symptoms last year in the setting of yoga, she feels they were brought on by GERD No recent symptoms over the past year - Has not been doing her normal walking up the driveway, her normal gym exercise - Recommend CT calcium scoring for risk stratification Risk factors include remote smoking history, hyperlipidemia  Hyperlipidemia Repeat labs pending, cholesterol last year to 36 Calcium score as above to help guide  management  GERD Reports she takes omeprazole, Pepcid as needed  Patient seen in consultation for Dr. Allena Hamilton and will be referred back to her office for ongoing care of the issues detailed above  Signed, Velinda Lunger, M.D., Ph.D. [facility] [place], [place] [phone]

## 2023-09-21 ENCOUNTER — Ambulatory Visit: Attending: Cardiovascular Disease | Admitting: Cardiovascular Disease

## 2023-09-21 ENCOUNTER — Encounter: Payer: Self-pay | Admitting: Cardiovascular Disease

## 2023-09-21 VITALS — BP 100/60 | HR 67 | Ht 63.0 in | Wt 94.2 lb

## 2023-09-21 DIAGNOSIS — E782 Mixed hyperlipidemia: Secondary | ICD-10-CM | POA: Diagnosis not present

## 2023-09-21 DIAGNOSIS — R0609 Other forms of dyspnea: Secondary | ICD-10-CM | POA: Diagnosis not present

## 2023-09-21 DIAGNOSIS — R079 Chest pain, unspecified: Secondary | ICD-10-CM

## 2023-09-21 NOTE — Patient Instructions (Addendum)
Medication Instructions:  No changes  If you need a refill on your cardiac medications before your next appointment, please call your pharmacy.   Lab work: No new labs needed  Testing/Procedures: CT coronary calcium score.   - $99 out of pocket cost at the time of your test - Call [phone] to schedule at your convenience.  [facility] [address] , [place] [address]   Coronary CalciumScan A coronary calcium scan is an imaging test used to look for deposits of calcium and other fatty materials (plaques) in the inner lining of the blood vessels of the heart (coronary arteries). These deposits of calcium and plaques can partly clog and narrow the coronary arteries without producing any symptoms or warning signs. This puts a person at risk for a heart attack. This test can detect these deposits before symptoms develop. Tell a health care provider about: Any allergies you have. All medicines you are taking, including vitamins, herbs, eye drops, creams, and over-the-counter medicines. Any problems you or family members have had with anesthetic medicines. Any blood disorders you have. Any surgeries you have had. Any medical conditions you have. Whether you are pregnant or may be pregnant. What are the risks? Generally, this is a safe procedure. However, problems may occur, including: Harm to a pregnant woman and her unborn baby. This test involves the use of radiation. Radiation exposure can be dangerous to a pregnant woman and her unborn baby. If you are pregnant, you generally should not have this procedure done. Slight increase in the risk of cancer. This is because of the radiation involved in the test. What happens before the procedure? No preparation is needed for this procedure. What happens during the procedure? You will undress and remove any jewelry around your neck or chest. You will put on a hospital gown. Sticky  electrodes will be placed on your chest. The electrodes will be connected to an electrocardiogram (ECG) machine to record a tracing of the electrical activity of your heart. A CT scanner will take pictures of your heart. During this time, you will be asked to lie still and hold your breath for 2-3 seconds while a picture of your heart is being taken. The procedure may vary among health care providers and hospitals. What happens after the procedure? You can get dressed. You can return to your normal activities. It is up to you to get the results of your test. Ask your health care provider, or the department that is doing the test, when your results will be ready. Summary A coronary calcium scan is an imaging test used to look for deposits of calcium and other fatty materials (plaques) in the inner lining of the blood vessels of the heart (coronary arteries). Generally, this is a safe procedure. Tell your health care provider if you are pregnant or may be pregnant. No preparation is needed for this procedure. A CT scanner will take pictures of your heart. You can return to your normal activities after the scan is done. This information is not intended to replace advice given to you by your health care provider. Make sure you discuss any questions you have with your health care provider. Document Released: 09/12/2007 Document Revised: 02/03/2016 Document Reviewed: 02/03/2016 Elsevier Interactive Patient Education  2017 Elsevier Inc.  Follow-Up: At [facility], you and your health needs are our priority.  As part of our continuing mission to provide you with exceptional heart care, we have created designated Provider Care   Teams.  These Care Teams include your primary Cardiologist (physician) and Advanced Practice Providers (APPs -  Physician Assistants and Nurse Practitioners) who all work together to provide you with the care you need, when you need it.  You will need a follow up appointment as  needed  Providers on your designated Care Team:   Christopher Berge, NP Ryan Dunn, PA-C Cadence Furth, PA-C  COVID-19 Vaccine Information can be found at: [email or web address] For questions related to vaccine distribution or appointments, please email [email or web address] or call [phone].   

## 2023-09-27 ENCOUNTER — Ambulatory Visit: Payer: Self-pay

## 2023-09-29 ENCOUNTER — Ambulatory Visit

## 2023-09-29 ENCOUNTER — Ambulatory Visit (INDEPENDENT_AMBULATORY_CARE_PROVIDER_SITE_OTHER)

## 2023-09-29 ENCOUNTER — Ambulatory Visit
Admission: RE | Admit: 2023-09-29 | Discharge: 2023-09-29 | Disposition: A | Discharge status: Home / Self Care | Source: Ambulatory Visit | Attending: Emergency Medicine | Admitting: Emergency Medicine

## 2023-09-29 VITALS — BP 142/82 | HR 83 | Temp 97.6°F | Resp 18

## 2023-09-29 DIAGNOSIS — R0781 Pleurodynia: Secondary | ICD-10-CM | POA: Diagnosis not present

## 2023-09-29 DIAGNOSIS — E538 Deficiency of other specified B group vitamins: Secondary | ICD-10-CM | POA: Diagnosis not present

## 2023-09-29 HISTORY — DX: Age-related osteoporosis without current pathological fracture: M81.0

## 2023-09-29 MED ORDER — PROMETHAZINE-DM 6.25-15 MG/5ML PO SYRP: 5.0000 mL | ORAL_SOLUTION | Freq: Four times a day (QID) | ORAL | 0 refills | Status: DC | PRN

## 2023-09-29 MED ORDER — BACLOFEN 10 MG PO TABS: 10.0000 mg | ORAL_TABLET | Freq: Three times a day (TID) | ORAL | 0 refills | Status: DC

## 2023-09-29 MED ORDER — BENZONATATE 100 MG PO CAPS: 200.0000 mg | ORAL_CAPSULE | Freq: Three times a day (TID) | ORAL | 0 refills | Status: DC

## 2023-09-29 MED ORDER — OXYCODONE HCL 5 MG PO TABS: 5.0000 mg | ORAL_TABLET | Freq: Four times a day (QID) | ORAL | 0 refills | Status: DC | PRN

## 2023-09-29 MED ORDER — CYANOCOBALAMIN 1000 MCG/ML IJ SOLN
1000.0000 ug | Freq: Once | INTRAMUSCULAR | Status: AC
  Administered 2023-09-29: 1000 ug via INTRAMUSCULAR

## 2023-09-29 NOTE — Progress Notes (Signed)
Pt received B12 injection in Left deltoid. Pt tolerated it well with no complaints or concerns.

## 2023-09-29 NOTE — Discharge Instructions (Addendum)
 Your x-ray today did not show any evidence of rib fractures or pneumonia.  I do believe that you have sprained your chest wall with your recent coughing and this is what is contributing to your pain.  You may continue to use the Tessalon  Perles every 8 hours as needed for cough and use the Promethazine  DM cough syrup at bedtime as needed for cough.  For your chest wall pain I would recommend using over-the-counter Tylenol  and or ibuprofen according to the package instructions for mild to moderate pain.  For more severe pain you may use the oxycodone  that I have prescribed for you.  No more than 1 tablet every 6 hours.  Be mindful this medication can sedate you so do not drink alcohol or drive if you take it.  You should take 10 deep breaths every hour to help inflate your lungs fully and prevent mucous from being trapped which could lead to pneumonia formation.  I am also going to prescribe you baclofen, a nonsedating muscle relaxer, and you can take 1 tablet every 8 hours to help with your rib pain as it may be coming from a sprain of the soft tissue between your ribs.  If your symptoms or not improving, or new symptoms develop, either return for reevaluation or follow-up with your primary care provider.

## 2023-09-29 NOTE — ED Provider Notes (Signed)
 [facility]    CSN: [id number] Arrival date & time: 09/29/23  1401      History   Chief Complaint Chief Complaint  Patient presents with   Cough    HPI Leslie Davidson is a 63 y.o. female.   HPI  63 year old female with past medical history significant for osteoporosis, GERD, arthritis, and hyperlipidemia presents for evaluation of pain in her left ribs.  She was seen little over a week ago and diagnosed with an upper respiratory tract infection.  She was prescribed Augmentin , Tessalon  Perles, and Promethazine  DM cough syrup.  She reports that she took all of her medication.  She now is experiencing pain with deep breathing and also with coughing.  She tries to suppress her cough because of how much it hurts.  She denies any fever.  Past Medical History:  Diagnosis Date   Arthritis    GERD (gastroesophageal reflux disease)    Osteoporosis     Patient Active Problem List   Diagnosis Date Noted   Cerumen impaction 07/26/2023   Stress 07/26/2023   Chest pressure 06/14/2022   Osteoporosis 12/08/2021   History of femur fracture 06/14/2021   Closed displaced fracture of left femoral neck (HCC) 03/22/2021   Preoperative clearance 03/22/2021   Accidental fall 03/22/2021   Closed left hip fracture (HCC) 03/22/2021   Abnormal Pap smear of cervix 01/15/2021   Depressive disorder, not elsewhere classified 01/15/2021   Incontinence 12/08/2020   Thrombocytopenia (HCC) 06/03/2020   Chest pain 12/10/2019   B12 deficiency 09/22/2018   Healthcare maintenance 08/22/2016   Abdominal pain, epigastric 11/14/2015   Abnormal mammogram 05/27/2015   Hyperlipidemia 04/22/2015   GERD (gastroesophageal reflux disease) 11/09/2014   Arthritis 11/09/2014   Edema 11/26/2011   Macrocytosis 07/16/2011    Past Surgical History:  Procedure Laterality Date   AUGMENTATION MAMMAPLASTY Bilateral    2000   BREAST ENHANCEMENT SURGERY  2000   HIP PINNING,CANNULATED Left 03/22/2021    Procedure: CANNULATED HIP PINNING;  Surgeon: Edie Norleen PARAS, MD;  Location: [facility] ORS;  Service: Orthopedics;  Laterality: Left;    OB History   No obstetric history on file.      Home Medications    Prior to Admission medications   Medication Sig Start Date End Date Taking? Authorizing Provider  baclofen (LIORESAL) 10 MG tablet Take 1 tablet (10 mg total) by mouth 3 (three) times daily. 09/29/23  Yes Bernardino Ditch, NP  oxyCODONE  (ROXICODONE ) 5 MG immediate release tablet Take 1 tablet (5 mg total) by mouth every 6 (six) hours as needed for severe pain (pain score 7-10). 09/29/23  Yes Bernardino Ditch, NP  benzonatate  (TESSALON ) 100 MG capsule Take 2 capsules (200 mg total) by mouth every 8 (eight) hours. 09/29/23   Bernardino Ditch, NP  BIOTIN PO Take 1 tablet by mouth daily.    [provider]  busPIRone  (BUSPAR ) 5 MG tablet Take 1 tablet (5 mg total) by mouth 2 (two) times daily. 07/29/23   Scott, Charlene, MD  ipratropium (ATROVENT ) 0.06 % nasal spray Place 2 sprays into both nostrils 4 (four) times daily. Patient not taking: Reported on 09/21/2023 09/16/23   Bernardino Ditch, NP  omeprazole (PRILOSEC) 20 MG capsule Take 20 mg by mouth daily. Pt is taking 10 mg instead of 20 mg    [provider]  promethazine -dextromethorphan (PROMETHAZINE -DM) 6.25-15 MG/5ML syrup Take 5 mLs by mouth 4 (four) times daily as needed. 09/29/23   Bernardino Ditch, NP    Family  History Family History  Problem Relation Age of Onset   Diabetes Mother    Cancer Father        Lung cancer   Arthritis Maternal Grandmother    Breast cancer Neg Hx     Social History Social History   Tobacco Use   Smoking status: Former    Current packs/day: 0.00    Types: Cigarettes    Quit date: 03/31/1983    Years since quitting: 40.5   Smokeless tobacco: Never  Vaping Use   Vaping status: Never Used  Substance Use Topics   Alcohol use: Yes    Alcohol/week: 0.0 standard drinks of alcohol    Comment: Occasional    Drug  use: No     Allergies   Patient has no known allergies.   Review of Systems Review of Systems  Constitutional:  Negative for fever.  Respiratory:  Positive for cough. Negative for shortness of breath and wheezing.   Cardiovascular:  Positive for chest pain.       Pain in left ribs     Physical Exam Triage Vital Signs ED Triage Vitals  Encounter Vitals Group     BP      Girls Systolic BP Percentile      Girls Diastolic BP Percentile      Boys Systolic BP Percentile      Boys Diastolic BP Percentile      Pulse      Resp      Temp      Temp src      SpO2      Weight      Height      Head Circumference      Peak Flow      Pain Score      Pain Loc      Pain Education      Exclude from Growth Chart    No data found.  Updated Vital Signs BP (!) 142/82 (BP Location: Right Arm)   Pulse 83   Temp 97.6 F (36.4 C) (Oral)   Resp 18   LMP 01/02/2013   SpO2 99%   Visual Acuity Right Eye Distance:   Left Eye Distance:   Bilateral Distance:    Right Eye Near:   Left Eye Near:    Bilateral Near:     Physical Exam Vitals and nursing note reviewed.  Constitutional:      Appearance: Normal appearance. She is not ill-appearing.  HENT:     Head: Normocephalic and atraumatic.  Cardiovascular:     Rate and Rhythm: Normal rate and regular rhythm.     Pulses: Normal pulses.     Heart sounds: Normal heart sounds. No murmur heard.    No friction rub. No gallop.  Pulmonary:     Effort: Pulmonary effort is normal.     Breath sounds: Normal breath sounds. No wheezing, rhonchi or rales.  Chest:     Chest wall: Tenderness present.  Skin:    General: Skin is warm and dry.     Capillary Refill: Capillary refill takes less than 2 seconds.     Findings: No rash.  Neurological:     Mental Status: She is alert.      UC Treatments / Results  Labs (all labs ordered are listed, but only abnormal results are displayed) Labs Reviewed - No data to  display  EKG   Radiology DG Ribs Unilateral W/Chest Left Result Date: 09/29/2023 CLINICAL DATA:  Pain in left  lateral ribs status post cough. Concerned about rib fracture given history of osteoporosis. EXAM: LEFT RIBS AND CHEST - 3+ VIEW COMPARISON:  March 21, 2021 FINDINGS: Osteopenia. Biapical pleural thickening. No focal airspace consolidation, pleural effusion, or pneumothorax. No cardiomegaly. No acute, displaced rib fracture or destructive lesion. Gaseous distension of the colon. IMPRESSION: No acute, displaced rib fracture. Otherwise, clear lungs. Electronically Signed   By: Rogelia Myers M.D.   On: 09/29/2023 14:45    Procedures Procedures (including critical care time)  Medications Ordered in UC Medications - No data to display  Initial Impression / Assessment and Plan / UC Course  I have reviewed the triage vital signs and the nursing notes.  Pertinent labs & imaging results that were available during my care of the patient were reviewed by me and considered in my medical decision making (see chart for details).   Patient is a nontoxic-appearing 63 year old female presenting for evaluation of pleuritic chest pain secondary to cough and recent URI.  She has continued to cough though she is trying to suppress it.  When she does cough it is nonproductive.  No associated shortness of breath or wheezing.  She is able to speak in full sentence without dyspnea or tachypnea.  Respiratory rate at triage was 18 with a 99% room air oxygen saturation.  Her lungs are clear to auscultation all fields.  She is concerned, given her history of osteoporosis, that she may have broken a rib.  I will obtain a left rib series to evaluate for the presence of possible rib fracture or any acute cardiopulmonary pathology.  Left rib x-rays independent reviewed and evaluated by me.  Pression: No evidence of rib fracture or dislocation.  PA chest shows lungs that are well aerated without evidence of  infiltrate or effusion.  Cardiomediastinal silhouette appears normal.  Radiology overread is pending. Radiology impression states no acute, displaced rib fracture.  Otherwise, clear lungs.  I will discharge patient home with a diagnosis of pleuritic pain with a refill of the Tessalon  Perles and Promethazine  DM cough syrup.  I will also write the patient for a short prescription of 5 mg oxycodone  that she can use every 6 hours as needed for rib pain.  I am can encourage her to take 10 deep breaths every hour to help maintain her airways and prevent pneumonia formation.  The patient has no open necrotic strips in PDMP.   Final Clinical Impressions(s) / UC Diagnoses   Final diagnoses:  Rib pain on left side  Pleuritic pain     Discharge Instructions      Your x-ray today did not show any evidence of rib fractures or pneumonia.  I do believe that you have sprained your chest wall with your recent coughing and this is what is contributing to your pain.  You may continue to use the Tessalon  Perles every 8 hours as needed for cough and use the Promethazine  DM cough syrup at bedtime as needed for cough.  For your chest wall pain I would recommend using over-the-counter Tylenol  and or ibuprofen according to the package instructions for mild to moderate pain.  For more severe pain you may use the oxycodone  that I have prescribed for you.  No more than 1 tablet every 6 hours.  Be mindful this medication can sedate you so do not drink alcohol or drive if you take it.  You should take 10 deep breaths every hour to help inflate your lungs fully and prevent mucous from being  trapped which could lead to pneumonia formation.  I am also going to prescribe you baclofen, a nonsedating muscle relaxer, and you can take 1 tablet every 8 hours to help with your rib pain as it may be coming from a sprain of the soft tissue between your ribs.  If your symptoms or not improving, or new symptoms develop, either  return for reevaluation or follow-up with your primary care provider.     ED Prescriptions     Medication Sig Dispense Auth. Provider   benzonatate  (TESSALON ) 100 MG capsule Take 2 capsules (200 mg total) by mouth every 8 (eight) hours. 21 capsule Bernardino Ditch, NP   promethazine -dextromethorphan (PROMETHAZINE -DM) 6.25-15 MG/5ML syrup Take 5 mLs by mouth 4 (four) times daily as needed. 118 mL Bernardino Ditch, NP   oxyCODONE  (ROXICODONE ) 5 MG immediate release tablet Take 1 tablet (5 mg total) by mouth every 6 (six) hours as needed for severe pain (pain score 7-10). 20 tablet Bernardino Ditch, NP   baclofen (LIORESAL) 10 MG tablet Take 1 tablet (10 mg total) by mouth 3 (three) times daily. 30 each Bernardino Ditch, NP      I have reviewed the PDMP during this encounter.   Bernardino Ditch, NP 09/29/23 1459

## 2023-09-29 NOTE — ED Triage Notes (Signed)
 Patient seen here a little over 1 week ago. Patient states she is still coughing some and when she does she has pain in her rib area on the left side.

## 2023-11-03 ENCOUNTER — Ambulatory Visit (INDEPENDENT_AMBULATORY_CARE_PROVIDER_SITE_OTHER)

## 2023-11-03 DIAGNOSIS — E538 Deficiency of other specified B group vitamins: Secondary | ICD-10-CM

## 2023-11-03 MED ORDER — CYANOCOBALAMIN 1000 MCG/ML IJ SOLN
1000.0000 ug | Freq: Once | INTRAMUSCULAR | Status: AC
Start: 2023-11-03 — End: 2023-11-03
  Administered 2023-11-03: 1000 ug via INTRAMUSCULAR

## 2023-11-03 NOTE — Progress Notes (Signed)
 Patient was administered a b12 injection into her right deltoid. Patient tolerated the b12 injection well.

## 2023-11-17 ENCOUNTER — Encounter: Payer: Self-pay | Admitting: Internal Medicine

## 2023-11-19 ENCOUNTER — Encounter: Payer: Self-pay | Admitting: Internal Medicine

## 2023-12-05 ENCOUNTER — Emergency Department
Admission: EM | Admit: 2023-12-05 | Discharge: 2023-12-05 | Disposition: A | Discharge status: Home / Self Care | Attending: Emergency Medicine | Admitting: Emergency Medicine

## 2023-12-05 DIAGNOSIS — S0003XA Contusion of scalp, initial encounter: Secondary | ICD-10-CM | POA: Insufficient documentation

## 2023-12-05 DIAGNOSIS — S0990XA Unspecified injury of head, initial encounter: Secondary | ICD-10-CM | POA: Diagnosis present

## 2023-12-05 DIAGNOSIS — W010XXA Fall on same level from slipping, tripping and stumbling without subsequent striking against object, initial encounter: Secondary | ICD-10-CM | POA: Insufficient documentation

## 2023-12-05 DIAGNOSIS — W19XXXA Unspecified fall, initial encounter: Secondary | ICD-10-CM

## 2023-12-05 NOTE — ED Triage Notes (Signed)
 Pt states she was walking around her home and tripped on the sidewalk. Pt states she scraped her R knee and hand when she tried catching herself and hit her head on the concrete. Pt denies LOC. No blood thinner use. Pt

## 2023-12-05 NOTE — ED Provider Notes (Signed)
 [facility] Provider Note    Event Date/Time   First MD Initiated Contact with Patient 12/05/23 2017     (approximate)   History   Chief Complaint Fall   HPI  Leslie Davidson is a 63 y.o. female with past medical history of hyperlipidemia and GERD who presents to the ED complaining of fall.  Patient reports that just prior to arrival she tripped and fell on a piece of uneven sidewalk, striking her right hand, right knee, and the right side of her head.  She denies losing consciousness and does not take a blood thinner.  She denies significant headache, neck pain, or pain in her extremities.  She has been ambulatory without difficulty since the fall, denies any nausea or vomiting.     Physical Exam   Triage Vital Signs: ED Triage Vitals  Encounter Vitals Group     BP 12/05/23 2003 119/72     Girls Systolic BP Percentile --      Girls Diastolic BP Percentile --      Boys Systolic BP Percentile --      Boys Diastolic BP Percentile --      Pulse Rate 12/05/23 2003 78     Resp 12/05/23 2003 19     Temp 12/05/23 2003 98.1 F (36.7 C)     Temp Source 12/05/23 2003 Oral     SpO2 12/05/23 2003 98 %     Weight 12/05/23 2012 98 lb (44.5 kg)     Height 12/05/23 2012 5' 3 (1.6 m)     Head Circumference --      Peak Flow --      Pain Score 12/05/23 2010 1     Pain Loc --      Pain Education --      Exclude from Growth Chart --     Most recent vital signs: Vitals:   12/05/23 2003  BP: 119/72  Pulse: 78  Resp: 19  Temp: 98.1 F (36.7 C)  SpO2: 98%    Constitutional: Alert and oriented. Eyes: Conjunctivae are normal. Head: Tenderness to palpation over right frontal scalp with mild edema but no step-offs or lacerations. Nose: No congestion/rhinnorhea. Mouth/Throat: Mucous membranes are moist.  Neck: No midline cervical spine tenderness to palpation, able to rotate to 45 degrees bilaterally without pain. Cardiovascular: Normal rate, regular rhythm.  Grossly normal heart sounds.  2+ radial pulses bilaterally. Respiratory: Normal respiratory effort.  No retractions. Lungs CTAB.  No chest wall tenderness to palpation. Gastrointestinal: Soft and nontender. No distention. Musculoskeletal: No lower extremity tenderness nor edema.  No upper extremity bony tenderness to palpation. Neurologic:  Normal speech and language. No gross focal neurologic deficits are appreciated.    ED Results / Procedures / Treatments   Labs (all labs ordered are listed, but only abnormal results are displayed) Labs Reviewed - No data to display   PROCEDURES:  Critical Care performed: No  Procedures   MEDICATIONS ORDERED IN ED: Medications - No data to display   IMPRESSION / MDM / ASSESSMENT AND PLAN / ED COURSE  I reviewed the triage vital signs and the nursing notes.                              63 y.o. female with past medical history of hyperlipidemia and GERD who presents to the ED complaining of trip and fall, striking her head as well as her right hand  and right knee.  Patient's presentation is most consistent with acute, uncomplicated illness.  Differential diagnosis includes, but is not limited to, intracranial injury, cervical spine injury, fracture, contusion.  Patient nontoxic-appearing and in no acute distress, vital signs are unremarkable.  She has a small amount of edema over her right frontal scalp, otherwise exam is unremarkable.  No findings concerning for cervical spine injury, technically cannot exclude intracranial injury by [place] rules because of her age but no features concerning for intracranial injury.  She was offered CT imaging of her head, but declines.  No evidence of traumatic injury to her trunk or extremities.  Patient appropriate for discharge home with follow-up as needed, was counseled on return precautions.  Patient and spouse agree with plan.      FINAL CLINICAL IMPRESSION(S) / ED DIAGNOSES   Final diagnoses:   Fall, initial encounter  Contusion of scalp, initial encounter     Rx / DC Orders   ED Discharge Orders     None        Note:  This document was prepared using Dragon voice recognition software and may include unintentional dictation errors.   Willo Dunnings, MD 12/05/23 2126

## 2023-12-08 ENCOUNTER — Ambulatory Visit (INDEPENDENT_AMBULATORY_CARE_PROVIDER_SITE_OTHER)

## 2023-12-08 ENCOUNTER — Ambulatory Visit: Payer: BC Managed Care – PPO | Admitting: Dermatology

## 2023-12-08 DIAGNOSIS — Z23 Encounter for immunization: Secondary | ICD-10-CM | POA: Diagnosis not present

## 2023-12-08 DIAGNOSIS — E538 Deficiency of other specified B group vitamins: Secondary | ICD-10-CM | POA: Diagnosis not present

## 2023-12-08 MED ORDER — CYANOCOBALAMIN 1000 MCG/ML IJ SOLN
1000.0000 ug | Freq: Once | INTRAMUSCULAR | Status: AC
  Administered 2023-12-08: 1000 ug via INTRAMUSCULAR

## 2023-12-08 NOTE — Progress Notes (Signed)
 Patient was administered a B12 injection into her left deltoid. Patient tolerated the B12 injection well.  Patient also was administered a flu vaccine into her right deltoid. Patient tolerated the flu vaccine well.

## 2023-12-13 ENCOUNTER — Other Ambulatory Visit (INDEPENDENT_AMBULATORY_CARE_PROVIDER_SITE_OTHER)

## 2023-12-13 ENCOUNTER — Ambulatory Visit: Payer: Self-pay | Admitting: Internal Medicine

## 2023-12-13 DIAGNOSIS — E785 Hyperlipidemia, unspecified: Secondary | ICD-10-CM | POA: Diagnosis not present

## 2023-12-13 DIAGNOSIS — D696 Thrombocytopenia, unspecified: Secondary | ICD-10-CM | POA: Diagnosis not present

## 2023-12-13 LAB — CBC WITH DIFFERENTIAL/PLATELET
Basophils Absolute: 0 K/uL (ref 0.0–0.1)
Basophils Relative: 1 % (ref 0.0–3.0)
Eosinophils Absolute: 0 K/uL (ref 0.0–0.7)
Eosinophils Relative: 1 % (ref 0.0–5.0)
HCT: 41.9 % (ref 36.0–46.0)
Hemoglobin: 13.7 g/dL (ref 12.0–15.0)
Lymphocytes Relative: 38.1 % (ref 12.0–46.0)
Lymphs Abs: 1.1 K/uL (ref 0.7–4.0)
MCHC: 32.8 g/dL (ref 30.0–36.0)
MCV: 99.7 fl (ref 78.0–100.0)
Monocytes Absolute: 0.3 K/uL (ref 0.1–1.0)
Monocytes Relative: 8.8 % (ref 3.0–12.0)
Neutro Abs: 1.5 K/uL (ref 1.4–7.7)
Neutrophils Relative %: 51.1 % (ref 43.0–77.0)
Platelets: 169 K/uL (ref 150.0–400.0)
RBC: 4.2 Mil/uL (ref 3.87–5.11)
RDW: 13 % (ref 11.5–15.5)
WBC: 3 K/uL — ABNORMAL LOW (ref 4.0–10.5)

## 2023-12-13 LAB — LIPID PANEL
Cholesterol: 230 mg/dL — ABNORMAL HIGH (ref 0–200)
HDL: 96.3 mg/dL (ref >39.00)
LDL Cholesterol: 118 mg/dL — ABNORMAL HIGH (ref 0–99)
NonHDL: 133.61
Total CHOL/HDL Ratio: 2
Triglycerides: 78 mg/dL (ref 0.0–149.0)
VLDL: 15.6 mg/dL (ref 0.0–40.0)

## 2023-12-13 LAB — BASIC METABOLIC PANEL WITH GFR
BUN: 11 mg/dL (ref 6–23)
CO2: 27 meq/L (ref 19–32)
Calcium: 9.2 mg/dL (ref 8.4–10.5)
Chloride: 102 meq/L (ref 96–112)
Creatinine, Ser: 0.77 mg/dL (ref 0.40–1.20)
GFR: 81.12 mL/min (ref >60.00)
Glucose, Bld: 87 mg/dL (ref 70–99)
Potassium: 4.6 meq/L (ref 3.5–5.1)
Sodium: 137 meq/L (ref 135–145)

## 2023-12-13 LAB — HEPATIC FUNCTION PANEL
ALT: 20 U/L (ref 0–35)
AST: 22 U/L (ref 0–37)
Albumin: 4.3 g/dL (ref 3.5–5.2)
Alkaline Phosphatase: 34 U/L — ABNORMAL LOW (ref 39–117)
Bilirubin, Direct: 0.1 mg/dL (ref 0.0–0.3)
Total Bilirubin: 0.6 mg/dL (ref 0.2–1.2)
Total Protein: 6.3 g/dL (ref 6.0–8.3)

## 2023-12-13 LAB — TSH: TSH: 1.16 u[IU]/mL (ref 0.35–5.50)

## 2023-12-15 ENCOUNTER — Ambulatory Visit (INDEPENDENT_AMBULATORY_CARE_PROVIDER_SITE_OTHER): Admitting: Internal Medicine

## 2023-12-15 ENCOUNTER — Encounter: Payer: Self-pay | Admitting: Internal Medicine

## 2023-12-15 VITALS — BP 120/68 | HR 78 | Temp 97.7°F | Ht 63.0 in | Wt 94.6 lb

## 2023-12-15 DIAGNOSIS — R87619 Unspecified abnormal cytological findings in specimens from cervix uteri: Secondary | ICD-10-CM

## 2023-12-15 DIAGNOSIS — Z0000 Encounter for general adult medical examination without abnormal findings: Secondary | ICD-10-CM | POA: Diagnosis not present

## 2023-12-15 DIAGNOSIS — D72819 Decreased white blood cell count, unspecified: Secondary | ICD-10-CM

## 2023-12-15 DIAGNOSIS — E785 Hyperlipidemia, unspecified: Secondary | ICD-10-CM

## 2023-12-15 DIAGNOSIS — M810 Age-related osteoporosis without current pathological fracture: Secondary | ICD-10-CM

## 2023-12-15 DIAGNOSIS — D696 Thrombocytopenia, unspecified: Secondary | ICD-10-CM

## 2023-12-15 DIAGNOSIS — F439 Reaction to severe stress, unspecified: Secondary | ICD-10-CM

## 2023-12-15 DIAGNOSIS — K219 Gastro-esophageal reflux disease without esophagitis: Secondary | ICD-10-CM

## 2023-12-15 NOTE — Progress Notes (Signed)
 Subjective:    Patient ID: Leslie Davidson, female    DOB: 02-Apr-1960, 63 y.o.   MRN: [id number]  Patient here for  Chief Complaint  Patient presents with   Annual Exam    Physical     HPI With past history of osteoporosis and increased stress. Here to follow up on these issues as well as for a complete physical exam.  Evaluated in ED 12/05/23 - s/p fall. Tripped on uneven sidewalk - striking her right hand, right knee and right side of her head. No LOC. Did not have head CT. Saw cardiology 09/21/23 - recommended CT calcium score. Pending. Discussed with her today. She is agreeable for the scan. Increased stress. Husband with bladder cancer and undergoing treatment. Has good support. Bowels stable. Discussed due reclast  infusion.    Past Medical History:  Diagnosis Date   Arthritis    GERD (gastroesophageal reflux disease)    Osteoporosis    Past Surgical History:  Procedure Laterality Date   AUGMENTATION MAMMAPLASTY Bilateral    2000   BREAST ENHANCEMENT SURGERY  2000   HIP PINNING,CANNULATED Left 03/22/2021   Procedure: CANNULATED HIP PINNING;  Surgeon: Edie Norleen PARAS, MD;  Location: [facility];  Service: Orthopedics;  Laterality: Left;   Family History  Problem Relation Age of Onset   Diabetes Mother    Cancer Father        Lung cancer   Arthritis Maternal Grandmother    Breast cancer Neg Hx    Social History   Socioeconomic History   Marital status: Married    Spouse name: Not on file   Number of children: Not on file   Years of education: Not on file   Highest education level: 12th grade  Occupational History   Not on file  Tobacco Use   Smoking status: Former    Current packs/day: 0.00    Types: Cigarettes    Quit date: 03/31/1983    Years since quitting: 40.7   Smokeless tobacco: Never  Vaping Use   Vaping status: Never Used  Substance and Sexual Activity   Alcohol use: Yes    Alcohol/week: 0.0 standard drinks of alcohol    Comment: Occasional    Drug use: No    Sexual activity: Yes    Partners: Male    Birth control/protection: Post-menopausal    Comment: Husband  Other Topics Concern   Not on file  Social History Narrative   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      Quit smoking in 1985. Rare alcohol.    Social Drivers of Corporate investment banker Strain: Low Risk  (06/10/2023)   Overall Financial Resource Strain (CARDIA)    Difficulty of Paying Living Expenses: Not hard at all  Food Insecurity: No Food Insecurity (06/10/2023)   Hunger Vital Sign    Worried About Running Out of Food in the Last Year: Never true    Ran Out of Food in the Last Year: Never true  Transportation Needs: Unmet Transportation Needs (06/10/2023)   PRAPARE - Administrator, Civil Service (Medical): Yes    Lack of Transportation (Non-Medical): No  Physical Activity: Unknown (06/10/2023)   Exercise Vital Sign    Days of Exercise per Week: 0 days    Minutes of Exercise per Session: Not on file  Stress: Stress Concern Present (06/10/2023)   [employer] of Occupational Health - Occupational Stress Questionnaire    Feeling of Stress : Very much  Social Connections: Moderately Integrated (06/10/2023)   Social Connection and Isolation Panel    Frequency of Communication with Friends and Family: More than three times a week    Frequency of Social Gatherings with Friends and Family: Twice a week    Attends Religious Services: More than 4 times per year    Active Member of [employer] or Organizations: No    Attends Engineer, structural: Not on file    Marital Status: Married     Review of Systems  Constitutional:  Negative for appetite change and unexpected weight change.  HENT:  Negative for congestion, sinus pressure and sore throat.   Eyes:  Negative for pain and visual disturbance.   Respiratory:  Negative for cough, chest tightness and shortness of breath.   Cardiovascular:  Negative for chest pain, palpitations and leg swelling.  Gastrointestinal:  Negative for abdominal pain, diarrhea, nausea and vomiting.  Genitourinary:  Negative for difficulty urinating and dysuria.  Musculoskeletal:  Negative for joint swelling and myalgias.  Skin:  Negative for color change and rash.  Neurological:  Negative for dizziness and headaches.  Hematological:  Negative for adenopathy. Does not bruise/bleed easily.  Psychiatric/Behavioral:  Negative for agitation and dysphoric mood.        Objective:     BP 120/68   Pulse 78   Temp 97.7 F (36.5 C) (Oral)   Ht 5' 3 (1.6 m)   Wt 94 lb 9.6 oz (42.9 kg)   LMP 01/02/2013   SpO2 98%   BMI 16.76 kg/m  Wt Readings from Last 3 Encounters:  12/15/23 94 lb 9.6 oz (42.9 kg)  12/05/23 98 lb (44.5 kg)  09/21/23 94 lb 3.2 oz (42.7 kg)    Physical Exam Vitals reviewed.  Constitutional:      General: She is not in acute distress.    Appearance: Normal appearance.  HENT:     Head: Normocephalic and atraumatic.     Right Ear: External ear normal.     Left Ear: External ear normal.     Mouth/Throat:     Pharynx: No oropharyngeal exudate or posterior oropharyngeal erythema.  Eyes:     General: No scleral icterus.       Right eye: No discharge.        Left eye: No discharge.     Conjunctiva/sclera: Conjunctivae normal.  Neck:     Thyroid : No thyromegaly.  Cardiovascular:     Rate and Rhythm: Normal rate and regular rhythm.  Pulmonary:     Effort: No respiratory distress.     Breath sounds: Normal breath sounds. No wheezing.     Comments: Breasts- no nipple discharge or nipple retraction present. Implants in place (bilateral). No palpable nodules or axillary adenopathy.  Abdominal:     General: Bowel sounds are normal.     Palpations: Abdomen is soft.     Tenderness: There is no abdominal tenderness.  Musculoskeletal:         General: No swelling or tenderness.     Cervical back: Neck supple. No tenderness.  Lymphadenopathy:     Cervical: No cervical adenopathy.  Skin:    Findings: No erythema or rash.  Neurological:     Mental Status: She is alert.  Psychiatric:        Mood and Affect: Mood normal.  Behavior: Behavior normal.         Outpatient Encounter Medications as of 12/15/2023  Medication Sig   BIOTIN PO Take 1 tablet by mouth daily.   omeprazole (PRILOSEC) 20 MG capsule Take 20 mg by mouth daily. Pt is taking 10 mg instead of 20 mg   [DISCONTINUED] baclofen  (LIORESAL ) 10 MG tablet Take 1 tablet (10 mg total) by mouth 3 (three) times daily.   [DISCONTINUED] benzonatate  (TESSALON ) 100 MG capsule Take 2 capsules (200 mg total) by mouth every 8 (eight) hours.   [DISCONTINUED] busPIRone  (BUSPAR ) 5 MG tablet Take 1 tablet (5 mg total) by mouth 2 (two) times daily.   [DISCONTINUED] ipratropium (ATROVENT ) 0.06 % nasal spray Place 2 sprays into both nostrils 4 (four) times daily. (Patient not taking: Reported on 09/21/2023)   [DISCONTINUED] oxyCODONE  (ROXICODONE ) 5 MG immediate release tablet Take 1 tablet (5 mg total) by mouth every 6 (six) hours as needed for severe pain (pain score 7-10).   [DISCONTINUED] promethazine -dextromethorphan (PROMETHAZINE -DM) 6.25-15 MG/5ML syrup Take 5 mLs by mouth 4 (four) times daily as needed.   No facility-administered encounter medications on file as of 12/15/2023.     Lab Results  Component Value Date   WBC 3.0 (L) 12/13/2023   HGB 13.7 12/13/2023   HCT 41.9 12/13/2023   PLT 169.0 12/13/2023   GLUCOSE 87 12/13/2023   CHOL 230 (H) 12/13/2023   TRIG 78.0 12/13/2023   HDL 96.30 12/13/2023   LDLCALC 118 (H) 12/13/2023   ALT 20 12/13/2023   AST 22 12/13/2023   NA 137 12/13/2023   K 4.6 12/13/2023   CL 102 12/13/2023   CREATININE 0.77 12/13/2023   BUN 11 12/13/2023   CO2 27 12/13/2023   TSH 1.16 12/13/2023   HGBA1C 5.9 09/06/2018       Assessment  & Plan:  Hyperlipidemia, unspecified hyperlipidemia type Assessment & Plan: The 10-year ASCVD risk score (Arnett DK, et al., 2019) is: 4.2%   Values used to calculate the score:     Age: 45 years     Clincally relevant sex: Female     Is Non-Hispanic African American: No     Diabetic: No     Tobacco smoker: No     Systolic Blood Pressure: 120 mmHg     Is BP treated: No     HDL Cholesterol: 96.3 mg/dL     Total Cholesterol: 230 mg/dL  Follow lipid panel. The 10-year ASCVD risk score (Arnett DK, et al., 2019) is: 4.2%   Values used to calculate the score:     Age: 11 years     Clincally relevant sex: Female     Is Non-Hispanic African American: No     Diabetic: No     Tobacco smoker: No     Systolic Blood Pressure: 120 mmHg     Is BP treated: No     HDL Cholesterol: 96.3 mg/dL     Total Cholesterol: 230 mg/dL  Follow lipid panel.    Healthcare maintenance Assessment & Plan: Physical today 12/15/23.  PAP 12/08/21.  Repeat 12/10/22 - negative with negative HPV. Colonoscopy 05/2019.  Recommended f/u in 10 years.  08/24/23 - Birads I.    Osteoporosis without current pathological fracture, unspecified osteoporosis type Assessment & Plan: Bone density 07/22/21- osteoporosis.  Received reclast . Due infusion. Will refer to endocrinology.   Orders: -     Ambulatory referral to Endocrinology -     VITAMIN D  25 Hydroxy (Vit-D Deficiency, Fractures); Future  Leukopenia,  unspecified type Assessment & Plan: Follow cbc.   Orders: -     CBC with Differential/Platelet; Future  Abnormal cervical Papanicolaou smear, unspecified abnormal pap finding Assessment & Plan: PAP 11/2021 - negative with negative HPV.  Recommend f/u in one year.  Repeat 11/2022 - negative with negative HPV.     Thrombocytopenia (HCC) Assessment & Plan: Chronic - mild.  Ultrasound - negative for liver disease.  Hepatitis, HIV, peripheral smear - normal.  Saw hematology.  Suspected ITP.  Recommended to continue to  follow.  Treat if pts platelets are less than 50 or starts bleeding. Follow cbc.    Stress Assessment & Plan: Increased stress/anxiety. Discussed. Did not tolerate lexapro . Zoloft did not work. Offered trial of buspar  last visit. Not taking anything now. Overall stable. Follow.    Gastroesophageal reflux disease without esophagitis Assessment & Plan: Saw GI 08/19/22 - f/u GERD. Recommended EGD.  EGD 11/17/22 - chronic gastris. Restarted prilosec last visit. No upper symptoms now. Will continue prilosec for now. Follow       Allena Hamilton, MD

## 2023-12-15 NOTE — Assessment & Plan Note (Signed)
 Physical today 12/15/23.  PAP 12/08/21.  Repeat 12/10/22 - negative with negative HPV. Colonoscopy 05/2019.  Recommended f/u in 10 years.  08/24/23 - Birads I.

## 2023-12-19 ENCOUNTER — Encounter: Payer: Self-pay | Admitting: Internal Medicine

## 2023-12-19 NOTE — Assessment & Plan Note (Signed)
 Bone density 07/22/21- osteoporosis.  Received reclast . Due infusion. Will refer to endocrinology.

## 2023-12-19 NOTE — Assessment & Plan Note (Signed)
 The 10-year ASCVD risk score (Arnett DK, et al., 2019) is: 4.2%   Values used to calculate the score:     Age: 63 years     Clincally relevant sex: Female     Is Non-Hispanic African American: No     Diabetic: No     Tobacco smoker: No     Systolic Blood Pressure: 120 mmHg     Is BP treated: No     HDL Cholesterol: 96.3 mg/dL     Total Cholesterol: 230 mg/dL  Follow lipid panel. The 10-year ASCVD risk score (Arnett DK, et al., 2019) is: 4.2%   Values used to calculate the score:     Age: 59 years     Clincally relevant sex: Female     Is Non-Hispanic African American: No     Diabetic: No     Tobacco smoker: No     Systolic Blood Pressure: 120 mmHg     Is BP treated: No     HDL Cholesterol: 96.3 mg/dL     Total Cholesterol: 230 mg/dL  Follow lipid panel.

## 2023-12-19 NOTE — Assessment & Plan Note (Signed)
 Saw GI 08/19/22 - f/u GERD. Recommended EGD.  EGD 11/17/22 - chronic gastris. Restarted prilosec last visit. No upper symptoms now. Will continue prilosec for now. Follow

## 2023-12-19 NOTE — Assessment & Plan Note (Signed)
 PAP 11/2021 - negative with negative HPV.  Recommend f/u in one year.  Repeat 11/2022 - negative with negative HPV.

## 2023-12-19 NOTE — Assessment & Plan Note (Signed)
Chronic - mild.  Ultrasound - negative for liver disease.  Hepatitis, HIV, peripheral smear - normal.  Saw hematology.  Suspected ITP.  Recommended to continue to follow.  Treat if pts platelets are less than 50 or starts bleeding. Follow cbc.

## 2023-12-19 NOTE — Assessment & Plan Note (Signed)
 Follow cbc.

## 2023-12-19 NOTE — Assessment & Plan Note (Signed)
 Increased stress/anxiety. Discussed. Did not tolerate lexapro . Zoloft did not work. Offered trial of buspar  last visit. Not taking anything now. Overall stable. Follow.

## 2023-12-28 ENCOUNTER — Encounter: Payer: Self-pay | Admitting: Internal Medicine

## 2023-12-28 NOTE — Telephone Encounter (Signed)
Please check on this referral  

## 2024-01-03 ENCOUNTER — Encounter: Payer: Self-pay | Admitting: Internal Medicine

## 2024-01-17 ENCOUNTER — Ambulatory Visit

## 2024-01-17 DIAGNOSIS — D72819 Decreased white blood cell count, unspecified: Secondary | ICD-10-CM | POA: Diagnosis not present

## 2024-01-17 DIAGNOSIS — M810 Age-related osteoporosis without current pathological fracture: Secondary | ICD-10-CM

## 2024-01-17 DIAGNOSIS — E538 Deficiency of other specified B group vitamins: Secondary | ICD-10-CM

## 2024-01-17 LAB — CBC WITH DIFFERENTIAL/PLATELET
Basophils Absolute: 0 K/uL (ref 0.0–0.1)
Basophils Relative: 1.1 % (ref 0.0–3.0)
Eosinophils Absolute: 0.1 K/uL (ref 0.0–0.7)
Eosinophils Relative: 1.8 % (ref 0.0–5.0)
HCT: 40.2 % (ref 36.0–46.0)
Hemoglobin: 13.4 g/dL (ref 12.0–15.0)
Lymphocytes Relative: 35.7 % (ref 12.0–46.0)
Lymphs Abs: 1.1 K/uL (ref 0.7–4.0)
MCHC: 33.4 g/dL (ref 30.0–36.0)
MCV: 99.3 fl (ref 78.0–100.0)
Monocytes Absolute: 0.3 K/uL (ref 0.1–1.0)
Monocytes Relative: 8.5 % (ref 3.0–12.0)
Neutro Abs: 1.7 K/uL (ref 1.4–7.7)
Neutrophils Relative %: 52.9 % (ref 43.0–77.0)
Platelets: 161 K/uL (ref 150.0–400.0)
RBC: 4.05 Mil/uL (ref 3.87–5.11)
RDW: 12.9 % (ref 11.5–15.5)
WBC: 3.2 K/uL — ABNORMAL LOW (ref 4.0–10.5)

## 2024-01-17 LAB — VITAMIN D 25 HYDROXY (VIT D DEFICIENCY, FRACTURES): VITD: 59.76 ng/mL (ref 30.00–100.00)

## 2024-01-17 MED ORDER — CYANOCOBALAMIN 1000 MCG/ML IJ SOLN
1000.0000 ug | Freq: Once | INTRAMUSCULAR | Status: AC
  Administered 2024-01-17: 1000 ug via INTRAMUSCULAR

## 2024-01-17 NOTE — Progress Notes (Signed)
 Pt presented for their vitamin B12 injection. Pt was identified through two identifiers. Pt tolerated shot well in their right deltoid.

## 2024-01-18 ENCOUNTER — Ambulatory Visit: Payer: Self-pay | Admitting: Internal Medicine

## 2024-01-19 ENCOUNTER — Other Ambulatory Visit

## 2024-01-25 ENCOUNTER — Telehealth: Payer: Self-pay | Admitting: *Deleted

## 2024-01-25 NOTE — Telephone Encounter (Signed)
 Patient says that his confusion is getting worse and she wanted to know what we could do with that.  Her friend again and the nurse Marjorie checked it until all that.  He had a urine specimen but all of it is not done yet it sometimes takes 2 to days to get weak with the urine to see if he has a UTI Then his blood was a little bit low but we need to have a vial of the blood bank to make sure that everything is good and he does not have any antibodies and then they will give you the blood after that .have .  Told the wife that she did have to be here at 30 with him to get this done and she says it is okay

## 2024-02-21 ENCOUNTER — Ambulatory Visit

## 2024-03-13 ENCOUNTER — Ambulatory Visit

## 2024-03-13 DIAGNOSIS — E538 Deficiency of other specified B group vitamins: Secondary | ICD-10-CM | POA: Diagnosis not present

## 2024-03-13 MED ORDER — CYANOCOBALAMIN 1000 MCG/ML IJ SOLN
1000.0000 ug | Freq: Once | INTRAMUSCULAR | Status: AC
  Administered 2024-03-13: 11:00:00 1000 ug via INTRAMUSCULAR

## 2024-03-13 NOTE — Progress Notes (Signed)
 Pt presented for their vitamin B12 injection. Pt was identified through two identifiers. Pt tolerated shot well in their left deltoid.

## 2024-04-12 ENCOUNTER — Ambulatory Visit

## 2024-04-12 DIAGNOSIS — E538 Deficiency of other specified B group vitamins: Secondary | ICD-10-CM

## 2024-04-12 MED ORDER — CYANOCOBALAMIN 1000 MCG/ML IJ SOLN
1000.0000 ug | Freq: Once | INTRAMUSCULAR | Status: AC
  Administered 2024-04-12: 1000 ug via INTRAMUSCULAR

## 2024-04-12 NOTE — Progress Notes (Signed)
 Patient was administered a B12 injection into her right deltoid. Patient tolerated the B12 injection well.

## 2024-05-15 ENCOUNTER — Ambulatory Visit

## 2024-06-13 ENCOUNTER — Ambulatory Visit: Admitting: Internal Medicine

## 2024-08-16 ENCOUNTER — Encounter: Admitting: Internal Medicine
